# Patient Record
Sex: Male | Born: 1955 | Race: White | Hispanic: No | Marital: Married | Smoking: Former smoker
Health system: Southern US, Community
[De-identification: ages and names within clinical notes are randomized; demographics above are authoritative.]

## PROBLEM LIST (undated history)

## (undated) DIAGNOSIS — E119 Type 2 diabetes mellitus without complications: Secondary | ICD-10-CM

## (undated) DIAGNOSIS — K219 Gastro-esophageal reflux disease without esophagitis: Secondary | ICD-10-CM

## (undated) DIAGNOSIS — E785 Hyperlipidemia, unspecified: Secondary | ICD-10-CM

## (undated) DIAGNOSIS — J449 Chronic obstructive pulmonary disease, unspecified: Secondary | ICD-10-CM

## (undated) HISTORY — DX: Hyperlipidemia, unspecified: E78.5

## (undated) HISTORY — DX: Type 2 diabetes mellitus without complications: E11.9

## (undated) HISTORY — DX: Chronic obstructive pulmonary disease, unspecified: J44.9

## (undated) HISTORY — DX: Gastro-esophageal reflux disease without esophagitis: K21.9

---
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
## 2010-04-06 ENCOUNTER — Ambulatory Visit: Payer: Worker's Compensation | Attending: *Deleted | Admitting: Physical Therapy

## 2010-04-06 DIAGNOSIS — M25559 Pain in unspecified hip: Secondary | ICD-10-CM | POA: Insufficient documentation

## 2010-04-06 DIAGNOSIS — M545 Low back pain, unspecified: Secondary | ICD-10-CM | POA: Insufficient documentation

## 2010-04-06 DIAGNOSIS — IMO0001 Reserved for inherently not codable concepts without codable children: Secondary | ICD-10-CM | POA: Insufficient documentation

## 2010-04-06 DIAGNOSIS — R5381 Other malaise: Secondary | ICD-10-CM | POA: Insufficient documentation

## 2010-04-08 ENCOUNTER — Encounter: Payer: Self-pay | Admitting: Physical Therapy

## 2010-04-08 ENCOUNTER — Ambulatory Visit: Payer: Worker's Compensation | Attending: *Deleted | Admitting: Physical Therapy

## 2010-04-08 ENCOUNTER — Ambulatory Visit: Payer: Self-pay | Admitting: Physical Therapy

## 2010-04-08 DIAGNOSIS — M25559 Pain in unspecified hip: Secondary | ICD-10-CM | POA: Insufficient documentation

## 2010-04-08 DIAGNOSIS — M545 Low back pain, unspecified: Secondary | ICD-10-CM | POA: Insufficient documentation

## 2010-04-08 DIAGNOSIS — R5381 Other malaise: Secondary | ICD-10-CM | POA: Insufficient documentation

## 2010-04-08 DIAGNOSIS — IMO0001 Reserved for inherently not codable concepts without codable children: Secondary | ICD-10-CM | POA: Insufficient documentation

## 2010-04-12 ENCOUNTER — Ambulatory Visit: Payer: Worker's Compensation | Admitting: Physical Therapy

## 2010-04-15 ENCOUNTER — Ambulatory Visit: Payer: Worker's Compensation | Admitting: *Deleted

## 2010-04-20 ENCOUNTER — Ambulatory Visit: Payer: Worker's Compensation | Admitting: Physical Therapy

## 2010-04-22 ENCOUNTER — Ambulatory Visit: Payer: Worker's Compensation | Admitting: *Deleted

## 2010-04-26 ENCOUNTER — Ambulatory Visit: Payer: Worker's Compensation | Admitting: Physical Therapy

## 2010-04-29 ENCOUNTER — Ambulatory Visit: Payer: Worker's Compensation | Admitting: Physical Therapy

## 2010-05-03 ENCOUNTER — Ambulatory Visit: Payer: Worker's Compensation | Admitting: *Deleted

## 2010-05-05 ENCOUNTER — Ambulatory Visit: Payer: Worker's Compensation | Attending: *Deleted | Admitting: *Deleted

## 2010-05-05 DIAGNOSIS — M545 Low back pain, unspecified: Secondary | ICD-10-CM | POA: Insufficient documentation

## 2010-05-05 DIAGNOSIS — M25559 Pain in unspecified hip: Secondary | ICD-10-CM | POA: Insufficient documentation

## 2010-05-05 DIAGNOSIS — IMO0001 Reserved for inherently not codable concepts without codable children: Secondary | ICD-10-CM | POA: Insufficient documentation

## 2010-05-05 DIAGNOSIS — R5381 Other malaise: Secondary | ICD-10-CM | POA: Insufficient documentation

## 2010-05-10 ENCOUNTER — Ambulatory Visit: Payer: Worker's Compensation | Admitting: *Deleted

## 2010-05-12 ENCOUNTER — Ambulatory Visit: Payer: Worker's Compensation | Admitting: *Deleted

## 2010-05-17 ENCOUNTER — Ambulatory Visit: Payer: Worker's Compensation | Admitting: *Deleted

## 2010-05-19 ENCOUNTER — Ambulatory Visit: Payer: Worker's Compensation | Admitting: *Deleted

## 2010-05-24 ENCOUNTER — Ambulatory Visit: Payer: Worker's Compensation | Admitting: *Deleted

## 2010-05-25 ENCOUNTER — Ambulatory Visit: Payer: Worker's Compensation | Admitting: Physical Therapy

## 2010-05-26 ENCOUNTER — Encounter: Payer: Self-pay | Admitting: *Deleted

## 2010-05-31 ENCOUNTER — Ambulatory Visit: Payer: Worker's Compensation | Admitting: Physical Therapy

## 2010-06-02 ENCOUNTER — Ambulatory Visit: Payer: Worker's Compensation | Admitting: Physical Therapy

## 2010-06-07 ENCOUNTER — Ambulatory Visit: Payer: Worker's Compensation | Attending: *Deleted | Admitting: Physical Therapy

## 2010-06-07 DIAGNOSIS — IMO0001 Reserved for inherently not codable concepts without codable children: Secondary | ICD-10-CM | POA: Insufficient documentation

## 2010-06-07 DIAGNOSIS — M545 Low back pain, unspecified: Secondary | ICD-10-CM | POA: Insufficient documentation

## 2010-06-07 DIAGNOSIS — M25559 Pain in unspecified hip: Secondary | ICD-10-CM | POA: Insufficient documentation

## 2010-06-07 DIAGNOSIS — R5381 Other malaise: Secondary | ICD-10-CM | POA: Insufficient documentation

## 2010-06-09 ENCOUNTER — Ambulatory Visit: Payer: Worker's Compensation | Admitting: *Deleted

## 2010-06-14 ENCOUNTER — Ambulatory Visit: Payer: Worker's Compensation | Admitting: *Deleted

## 2010-06-16 ENCOUNTER — Ambulatory Visit: Payer: Worker's Compensation | Admitting: *Deleted

## 2010-06-21 ENCOUNTER — Ambulatory Visit: Payer: Worker's Compensation | Admitting: Physical Therapy

## 2010-06-23 ENCOUNTER — Ambulatory Visit: Payer: Worker's Compensation | Admitting: *Deleted

## 2010-06-28 ENCOUNTER — Ambulatory Visit: Payer: Worker's Compensation | Admitting: Physical Therapy

## 2010-06-30 ENCOUNTER — Ambulatory Visit: Payer: Worker's Compensation | Admitting: *Deleted

## 2010-07-05 ENCOUNTER — Ambulatory Visit: Payer: Worker's Compensation | Attending: *Deleted | Admitting: *Deleted

## 2010-07-05 DIAGNOSIS — M25559 Pain in unspecified hip: Secondary | ICD-10-CM | POA: Insufficient documentation

## 2010-07-05 DIAGNOSIS — R5381 Other malaise: Secondary | ICD-10-CM | POA: Insufficient documentation

## 2010-07-05 DIAGNOSIS — IMO0001 Reserved for inherently not codable concepts without codable children: Secondary | ICD-10-CM | POA: Insufficient documentation

## 2010-07-05 DIAGNOSIS — M545 Low back pain, unspecified: Secondary | ICD-10-CM | POA: Insufficient documentation

## 2010-07-07 ENCOUNTER — Ambulatory Visit: Payer: Worker's Compensation | Admitting: *Deleted

## 2010-07-13 ENCOUNTER — Ambulatory Visit: Payer: Worker's Compensation | Admitting: Physical Therapy

## 2010-07-20 ENCOUNTER — Ambulatory Visit: Payer: Worker's Compensation | Admitting: Physical Therapy

## 2014-03-26 ENCOUNTER — Encounter (INDEPENDENT_AMBULATORY_CARE_PROVIDER_SITE_OTHER): Payer: Self-pay

## 2014-03-26 ENCOUNTER — Ambulatory Visit (INDEPENDENT_AMBULATORY_CARE_PROVIDER_SITE_OTHER): Payer: Managed Care, Other (non HMO) | Admitting: Family

## 2014-03-26 ENCOUNTER — Encounter: Payer: Self-pay | Admitting: Family

## 2014-03-26 VITALS — BP 143/93 | HR 102 | Temp 98.2°F | Ht 66.0 in | Wt 202.0 lb

## 2014-03-26 DIAGNOSIS — L02212 Cutaneous abscess of back [any part, except buttock]: Secondary | ICD-10-CM

## 2014-03-26 MED ORDER — SULFAMETHOXAZOLE-TRIMETHOPRIM 800-160 MG PO TABS: 1.0000 | ORAL_TABLET | Freq: Two times a day (BID) | ORAL | Status: DC

## 2014-03-26 NOTE — Progress Notes (Signed)
   Subjective:    Patient ID: Ryan Mcintyre, male    DOB: [birth year], 59 y.o.   MRN: [id number]  HPI Pt presents to the office for a abscess on right upper shoulder.  PT states he has had it for "months", but it has seemed to be worse. States he can not lay on it. States he put some time of cream on it, and it started "ozzing last night".   Review of Systems  Constitutional: Negative.   HENT: Negative.   Respiratory: Negative.   Cardiovascular: Negative.   Gastrointestinal: Negative.   Endocrine: Negative.   Genitourinary: Negative.   Musculoskeletal: Negative.   Neurological: Negative.   Hematological: Negative.   Psychiatric/Behavioral: Negative.   All other systems reviewed and are negative.      Objective:   Physical Exam  Constitutional: He is oriented to person, place, and time. He appears well-developed and well-nourished. No distress.  Neck: Normal range of motion. Neck supple. No thyromegaly present.  Cardiovascular: Normal rate, regular rhythm, normal heart sounds and intact distal pulses.   No murmur heard. Pulmonary/Chest: Effort normal and breath sounds normal. No respiratory distress. He has no wheezes.  Abdominal: Soft. Bowel sounds are normal. He exhibits no distension. There is no tenderness.  Musculoskeletal: Normal range of motion. He exhibits no edema or tenderness.  Neurological: He is alert and oriented to person, place, and time.  Skin: Skin is warm and dry. Rash noted. Rash is pustular. No erythema.  Psychiatric: He has a normal mood and affect. His behavior is normal. Judgment and thought content normal.  Vitals reviewed.   BP 143/93 mmHg  Pulse 102  Temp(Src) 98.2 F (36.8 C) (Oral)  Ht 5\' 6"  (1.676 m)  Wt 202 lb (91.627 kg)  BMI 32.62 kg/m2  Abscess- I & D with moderate amt of white cottage cheese drainage. Cleaned with peroxide and neosporin applied.       Assessment & Plan:  1. Back abscess -Wound care discussed -Keep clean and dry -RTO  in 2 weeks - sulfamethoxazole-trimethoprim (BACTRIM DS,SEPTRA DS) 800-160 MG per tablet; Take 1 tablet by mouth 2 (two) times daily.  Dispense: 28 tablet; Refill: 0  Jannifer Rodneyhristy Jaelee Laughter, FNP

## 2014-03-26 NOTE — Patient Instructions (Signed)

## 2014-04-15 ENCOUNTER — Ambulatory Visit (INDEPENDENT_AMBULATORY_CARE_PROVIDER_SITE_OTHER): Payer: Managed Care, Other (non HMO) | Admitting: Family

## 2014-04-15 ENCOUNTER — Encounter: Payer: Self-pay | Admitting: Family

## 2014-04-15 VITALS — BP 119/88 | HR 88 | Temp 97.4°F | Ht 66.0 in | Wt 200.4 lb

## 2014-04-15 DIAGNOSIS — L02212 Cutaneous abscess of back [any part, except buttock]: Secondary | ICD-10-CM

## 2014-04-15 MED ORDER — CEPHALEXIN 500 MG PO CAPS: 500.0000 mg | ORAL_CAPSULE | Freq: Three times a day (TID) | ORAL | Status: DC

## 2014-04-15 NOTE — Progress Notes (Signed)
   Subjective:    Patient ID: Ryan Mcintyre, male    DOB: May 28, 1955, 59 y.o.   MRN: [id number]  HPI Pt presents to the office to recheck abscess on back. Pt finished his coarse of bactrium and states it has improved greately. States the pain is gone and it is no longer draining.    Review of Systems  Constitutional: Negative.   HENT: Negative.   Respiratory: Negative.   Cardiovascular: Negative.   Gastrointestinal: Negative.   Endocrine: Negative.   Genitourinary: Negative.   Musculoskeletal: Negative.   Neurological: Negative.   Hematological: Negative.   Psychiatric/Behavioral: Negative.   All other systems reviewed and are negative.      Objective:   Physical Exam  Constitutional: He is oriented to person, place, and time. He appears well-developed and well-nourished. No distress.  Neck: Normal range of motion. Neck supple. No thyromegaly present.  Cardiovascular: Normal rate, regular rhythm, normal heart sounds and intact distal pulses.   No murmur heard. Pulmonary/Chest: Effort normal and breath sounds normal. No respiratory distress. He has no wheezes.  Abdominal: Soft. Bowel sounds are normal. He exhibits no distension. There is no tenderness.  Musculoskeletal: Normal range of motion. He exhibits no edema or tenderness.  Neurological: He is alert and oriented to person, place, and time. He has normal reflexes. No cranial nerve deficit.  Skin: Skin is warm and dry. Rash noted. Rash is pustular (Right shoulder small erythemas, with small amt of yellow pus drainage ). No erythema.  Psychiatric: He has a normal mood and affect. His behavior is normal. Judgment and thought content normal.  Vitals reviewed.    BP 119/88 mmHg  Pulse 88  Temp(Src) 97.4 F (36.3 C) (Oral)  Ht 5\' 6"  (1.676 m)  Wt 200 lb 6.4 oz (90.901 kg)  BMI 32.36 kg/m2      Assessment & Plan:  1. Abscess of back -Discuss wound care -Discuss he needs to come back if it becomes infected again -Keep  clean and dry - cephALEXin (KEFLEX) 500 MG capsule; Take 1 capsule (500 mg total) by mouth 3 (three) times daily.  Dispense: 21 capsule; Refill: 0  Jannifer Rodneyhristy Nelson Julson, FNP

## 2014-04-15 NOTE — Patient Instructions (Signed)

## 2014-09-28 ENCOUNTER — Ambulatory Visit (INDEPENDENT_AMBULATORY_CARE_PROVIDER_SITE_OTHER): Payer: Managed Care, Other (non HMO) | Admitting: Family

## 2014-09-28 ENCOUNTER — Encounter: Payer: Self-pay | Admitting: Family

## 2014-09-28 VITALS — BP 116/78 | HR 92 | Temp 98.1°F | Ht 66.0 in | Wt 184.2 lb

## 2014-09-28 DIAGNOSIS — K219 Gastro-esophageal reflux disease without esophagitis: Secondary | ICD-10-CM | POA: Diagnosis not present

## 2014-09-28 DIAGNOSIS — Z0000 Encounter for general adult medical examination without abnormal findings: Secondary | ICD-10-CM | POA: Diagnosis not present

## 2014-09-28 DIAGNOSIS — Z23 Encounter for immunization: Secondary | ICD-10-CM

## 2014-09-28 DIAGNOSIS — E119 Type 2 diabetes mellitus without complications: Secondary | ICD-10-CM | POA: Diagnosis not present

## 2014-09-28 LAB — POCT GLYCOSYLATED HEMOGLOBIN (HGB A1C)

## 2014-09-28 LAB — GLUCOSE, POCT (MANUAL RESULT ENTRY): POC Glucose: 273 mg/dl — AB (ref 70–99)

## 2014-09-28 LAB — POCT UA - MICROALBUMIN: Microalbumin Ur, POC: NEGATIVE mg/L

## 2014-09-28 NOTE — Addendum Note (Signed)
Addended by: Prescott Gum on: 09/28/2014 01:02 PM   Modules accepted: Orders, SmartSet

## 2014-09-28 NOTE — Patient Instructions (Signed)

## 2014-09-28 NOTE — Progress Notes (Signed)
Subjective:    Patient ID: Ryan Mcintyre, male    DOB: 22-Feb-1956, 59 y.o.   MRN: [id number]  Pt presents to the office today for CPE.  Diabetes He presents for his follow-up diabetic visit. He has type 2 diabetes mellitus. His disease course has been stable. There are no hypoglycemic associated symptoms. Pertinent negatives for diabetes include no blurred vision, no chest pain, no foot paresthesias and no foot ulcerations. Symptoms are stable. There are no diabetic complications. Pertinent negatives for diabetic complications include no CVA, heart disease, nephropathy or peripheral neuropathy. Risk factors for coronary artery disease include diabetes mellitus, obesity, tobacco exposure and sedentary lifestyle. Current diabetic treatment includes oral agent (dual therapy). He is compliant with treatment all of the time. He is following a generally healthy diet. His breakfast blood glucose range is generally 110-130 mg/dl. An ACE inhibitor/angiotensin II receptor blocker is not being taken. Eye exam is current.  Gastrophageal Reflux He reports no chest pain, no coughing, no heartburn or no sore throat. This is a chronic problem. The current episode started more than 1 year ago. The problem occurs occasionally. The problem has been resolved. The symptoms are aggravated by lying down and certain foods. Risk factors include smoking/tobacco exposure. He has tried a PPI for the symptoms. The treatment provided significant relief.      Review of Systems  Constitutional: Negative.   HENT: Negative.  Negative for sore throat.   Eyes: Negative for blurred vision.  Respiratory: Negative.  Negative for cough.   Cardiovascular: Negative.  Negative for chest pain.  Gastrointestinal: Negative.  Negative for heartburn.  Endocrine: Negative.   Genitourinary: Negative.   Musculoskeletal: Negative.   Neurological: Negative.   Hematological: Negative.   Psychiatric/Behavioral: Negative.   All other systems  reviewed and are negative.      Objective:   Physical Exam  Constitutional: He is oriented to person, place, and time. He appears well-developed and well-nourished. No distress.  HENT:  Head: Normocephalic.  Right Ear: External ear normal.  Left Ear: External ear normal.  Nose: Nose normal.  Mouth/Throat: Oropharynx is clear and moist.  Eyes: Pupils are equal, round, and reactive to light. Right eye exhibits no discharge. Left eye exhibits no discharge.  Neck: Normal range of motion. Neck supple. No thyromegaly present.  Cardiovascular: Normal rate, regular rhythm, normal heart sounds and intact distal pulses.   No murmur heard. Pulmonary/Chest: Effort normal and breath sounds normal. No respiratory distress. He has no wheezes.  Abdominal: Soft. Bowel sounds are normal. He exhibits no distension. There is no tenderness.  Musculoskeletal: Normal range of motion. He exhibits no edema or tenderness.  Neurological: He is alert and oriented to person, place, and time. He has normal reflexes. No cranial nerve deficit.  Skin: Skin is warm and dry. No rash noted. No erythema.  Psychiatric: He has a normal mood and affect. His behavior is normal. Judgment and thought content normal.  Vitals reviewed.    BP 116/78 mmHg  Pulse 92  Temp(Src) 98.1 F (36.7 C) (Oral)  Ht $R'5\' 6"'vx$  (1.676 m)  Wt 184 lb 3.2 oz (83.553 kg)  BMI 29.75 kg/m2      Assessment & Plan:  1. Type 2 diabetes mellitus without complication - NLZ76+BHAL - POCT glycosylated hemoglobin (Hb A1C) - POCT UA - Microalbumin  2. Gastroesophageal reflux disease, esophagitis presence not specified - CMP14+EGFR  3. Annual physical exam - CMP14+EGFR - Lipid panel - POCT glycosylated hemoglobin (Hb A1C) - POCT UA -  Microalbumin - Thyroid Panel With TSH - PSA, total and free - Vit D  25 hydroxy (rtn osteoporosis monitoring) - Pneumococcal conjugate vaccine 13-valent IM - Tdap vaccine greater than or equal to 7yo  IM   Continue all meds Labs pending Health Maintenance reviewed Diet and exercise encouraged RTO 6 months  Evelina Dun, FNP

## 2014-09-29 LAB — CMP14+EGFR
A/G RATIO: 1.8 (ref 1.1–2.5)
ALK PHOS: 102 IU/L (ref 39–117)
ALT: 25 IU/L (ref 0–44)
AST: 11 IU/L (ref 0–40)
Albumin: 4.2 g/dL (ref 3.6–4.8)
BUN/Creatinine Ratio: 14 (ref 10–22)
BUN: 14 mg/dL (ref 8–27)
Bilirubin Total: 0.4 mg/dL (ref 0.0–1.2)
CHLORIDE: 98 mmol/L (ref 97–108)
CO2: 24 mmol/L (ref 18–29)
CREATININE: 1.02 mg/dL (ref 0.76–1.27)
Calcium: 9.1 mg/dL (ref 8.6–10.2)
GFR calc Af Amer: 91 mL/min/{1.73_m2} (ref >59)
GFR calc non Af Amer: 79 mL/min/{1.73_m2} (ref >59)
Globulin, Total: 2.3 g/dL (ref 1.5–4.5)
Glucose: 313 mg/dL — ABNORMAL HIGH (ref 65–99)
Potassium: 4.2 mmol/L (ref 3.5–5.2)
Sodium: 140 mmol/L (ref 134–144)
TOTAL PROTEIN: 6.5 g/dL (ref 6.0–8.5)

## 2014-09-29 LAB — LIPID PANEL
Chol/HDL Ratio: 6.3 ratio units — ABNORMAL HIGH (ref 0.0–5.0)
Cholesterol, Total: 184 mg/dL (ref 100–199)
HDL: 29 mg/dL — ABNORMAL LOW (ref >39)
Triglycerides: 416 mg/dL — ABNORMAL HIGH (ref 0–149)

## 2014-09-29 LAB — PSA, TOTAL AND FREE
PROSTATE SPECIFIC AG, SERUM: 1.3 ng/mL (ref 0.0–4.0)
PSA, Free Pct: 22.3 %
PSA, Free: 0.29 ng/mL

## 2014-09-29 LAB — THYROID PANEL WITH TSH
Free Thyroxine Index: 2.1 (ref 1.2–4.9)
T3 Uptake Ratio: 26 % (ref 24–39)
T4, Total: 7.9 ug/dL (ref 4.5–12.0)
TSH: 1.34 u[IU]/mL (ref 0.450–4.500)

## 2014-09-29 LAB — VITAMIN D 25 HYDROXY (VIT D DEFICIENCY, FRACTURES): Vit D, 25-Hydroxy: 30.5 ng/mL (ref 30.0–100.0)

## 2014-09-30 ENCOUNTER — Other Ambulatory Visit: Payer: Self-pay | Admitting: Family

## 2014-09-30 DIAGNOSIS — E785 Hyperlipidemia, unspecified: Secondary | ICD-10-CM | POA: Insufficient documentation

## 2014-09-30 DIAGNOSIS — E1169 Type 2 diabetes mellitus with other specified complication: Secondary | ICD-10-CM | POA: Insufficient documentation

## 2014-09-30 MED ORDER — ROSUVASTATIN CALCIUM 20 MG PO TABS: 20.0000 mg | ORAL_TABLET | Freq: Every day | ORAL | Status: DC

## 2014-09-30 MED ORDER — GLIMEPIRIDE 2 MG PO TABS: 2.0000 mg | ORAL_TABLET | Freq: Every day | ORAL | Status: DC

## 2014-09-30 MED ORDER — METFORMIN HCL 1000 MG PO TABS: ORAL_TABLET | ORAL | Status: DC

## 2014-10-05 ENCOUNTER — Encounter: Payer: Self-pay | Admitting: Pharmacist

## 2014-10-05 ENCOUNTER — Encounter: Payer: Self-pay | Admitting: *Deleted

## 2014-10-05 ENCOUNTER — Ambulatory Visit (INDEPENDENT_AMBULATORY_CARE_PROVIDER_SITE_OTHER): Payer: Managed Care, Other (non HMO) | Admitting: Pharmacist

## 2014-10-05 VITALS — BP 108/78 | HR 88 | Ht 66.0 in | Wt 184.0 lb

## 2014-10-05 DIAGNOSIS — E119 Type 2 diabetes mellitus without complications: Secondary | ICD-10-CM | POA: Diagnosis not present

## 2014-10-05 DIAGNOSIS — E782 Mixed hyperlipidemia: Secondary | ICD-10-CM

## 2014-10-05 DIAGNOSIS — Z720 Tobacco use: Secondary | ICD-10-CM | POA: Diagnosis not present

## 2014-10-05 NOTE — Progress Notes (Signed)
Subjective:    Ryan Mcintyre is a 59 y.o. male who presents for an initial evaluation of Type 2 diabetes mellitus.  Current symptoms/problems include hyperglycemia, nausea, polydipsia, polyuria and weight loss and have been worsening. Symptoms have been present for 4 months. Mother and sister have type 2 DM.  The patient was initially diagnosed with Type 2 diabetes about 1 year ago.  Known diabetic complications: none Cardiovascular risk factors: advanced age (older than 48 for men, 44 for women), diabetes mellitus, dyslipidemia, family history of premature cardiovascular disease and male gender Current diabetic medications include Metformin  qam and  qpm; glimepiride  1 tablet daily..   Eye exam current (within one year): yes Weight trend: decreasing steadily Prior visit with dietician: no - patient was drinking lots of sweet tea and kool-aid sweetened with sugar.  He has never has education about CHO counting diet. Current diet: in general, an "unhealthy" diet Current exercise: none  Current monitoring regimen: home blood tests - 1-2 times daily Home blood sugar records: only started rechecking after 09/30/14 - have been in the 200's since then. Any episodes of hypoglycemia? no  Is He on ACE inhibitor or angiotensin II receptor blocker?  No       The following portions of the patient's history were reviewed and updated as appropriate: allergies, current medications, past family history, past medical history, past social history, past surgical history and problem list.  Objective:    There were no vitals taken for this visit. Lab Review GLUCOSE (mg/dL)  Date Value  86/57/8469 313*   CO2 (mmol/L)  Date Value  09/28/2014 24   BUN (mg/dL)  Date Value  62/95/2841 14   CREATININE, SER (mg/dL)  Date Value  32/44/0102 1.02    A1c = greater then 14% (09/28/2014) Triglycerides = 416 HLD = 29 Assessment:    Diabetes Mellitus type II, under inadequate control.    Hypertriglyceridemia with low HDL - unable to calculate LDL due to elevated Tg.  HTN - BP at goals Tobacco Abuse Plan:    1.  Rx changes: none 2.  Education: Reviewed 'ABCs' of diabetes management (respective goals in parentheses):  A1C (<7), blood pressure (<130/80), and cholesterol (LDL <100). 3.  Compliance at present is estimated to be fair. Efforts to improve compliance (if necessary) will be directed at dietary modifications: discussed CHO counting diet in depth (spent about 30 minutes discussing this).  Patient to continue to not drink sugar sweetented drinks.  Increase non starchy vegetables, limit high fat foods and to limit serving sizes of hihg CHO foods.  Goal is 45 to 55 grams per meal and 15 to 20 grams per snack., increased exercise and regular blood sugar monitoring: 1 to 2  times daily. 4.  Discuss smoking cessation - patient is considering quitting "cold [place]".  Discussed several medication options that can assist with quitting smoking.  He is to consider.  Also gave information about setting quit date and [place] QUIT line.  4. Follow up: 2 months    Henrene Pastor, PharmD, CPP, CDE

## 2014-10-05 NOTE — Patient Instructions (Addendum)
Diabetes and Standards of Medical Care   Diabetes is complicated. You may find that your diabetes team includes a dietitian, nurse, diabetes educator, eye doctor, and more. To help everyone know what is going on and to help you get the care you deserve, the following schedule of care was developed to help keep you on track. Below are the tests, exams, vaccines, medicines, education, and plans you will need.  Blood Glucose Goals Prior to meals = 80 - 130 Within 2 hours of the start of a meal = less than 180  HbA1c test (goal is less than 7.0% - your last value was over 14%) This test shows how well you have controlled your glucose over the past 2 to 3 months. It is used to see if your diabetes management plan needs to be adjusted.   It is performed at least 2 times a year if you are meeting treatment goals.  It is performed 4 times a year if therapy has changed or if you are not meeting treatment goals.  Blood pressure test  This test is performed at every routine medical visit. The goal is less than 140/90 mmHg for most people, but 130/80 mmHg in some cases. Ask your health care provider about your goal.  Dental exam  Follow up with the dentist regularly.  Eye exam  If you are diagnosed with type 1 diabetes as a child, get an exam upon reaching the age of 1 years or older and have had diabetes for 3 to 5 years. Yearly eye exams are recommended after that initial eye exam.  If you are diagnosed with type 1 diabetes as an adult, get an exam within 5 years of diagnosis and then yearly.  If you are diagnosed with type 2 diabetes, get an exam as soon as possible after the diagnosis and then yearly.  Foot care exam  Visual foot exams are performed at every routine medical visit. The exams check for cuts, injuries, or other problems with the feet.  A comprehensive foot exam should be done yearly. This includes visual inspection as well as assessing foot pulses and testing for loss of  sensation.  Check your feet nightly for cuts, injuries, or other problems with your feet. Tell your health care provider if anything is not healing.  Kidney function test (urine microalbumin)  This test is performed once a year.  Type 1 diabetes: The first test is performed 5 years after diagnosis.  Type 2 diabetes: The first test is performed at the time of diagnosis.  A serum creatinine and estimated glomerular filtration rate (eGFR) test is done once a year to assess the level of chronic kidney disease (CKD), if present.  Lipid profile (cholesterol, HDL, LDL, triglycerides)  Performed every 5 years for most people.  The goal for LDL is less than 100 mg/dL. If you are at high risk, the goal is less than 70 mg/dL.  The goal for HDL is 40 mg/dL to 50 mg/dL for men and 50 mg/dL to 60 mg/dL for women. An HDL cholesterol of 60 mg/dL or higher gives some protection against heart disease. (yours was 29 when checked 09-30-2014)  The goal for triglycerides is less than 150 mg/dL. (your were 416 when checked 09-29-2104)  Influenza vaccine, pneumococcal vaccine, and hepatitis B vaccine  The influenza vaccine is recommended yearly.  The pneumococcal vaccine is generally given once in a lifetime. However, there are some instances when another vaccination is recommended. Check with your health care provider.  The hepatitis B vaccine is also recommended for adults with diabetes.  Diabetes self-management education  Education is recommended at diagnosis and ongoing as needed.  Treatment plan  Your treatment plan is reviewed at every medical visit.        Hypoglycemia Hypoglycemia occurs when the glucose in your blood is too low. Glucose is a type of sugar that is your body's main energy source. Hormones, such as insulin and glucagon, control the level of glucose in the blood. Insulin lowers blood glucose and glucagon increases blood glucose. Having too much insulin in your blood  stream, or not eating enough food containing sugar, can result in hypoglycemia. Hypoglycemia can happen to people with or without diabetes. It can develop quickly and can be a medical emergency.  CAUSES   Missing or delaying meals.  Not eating enough carbohydrates at meals.  Taking too much diabetes medicine.  Not timing your oral diabetes medicine or insulin doses with meals, snacks, and exercise.  Nausea and vomiting.  Certain medicines.  Severe illnesses, such as hepatitis, kidney disorders, and certain eating disorders.  Increased activity or exercise without eating something extra or adjusting medicines.  Drinking too much alcohol.  A nerve disorder that affects body functions like your heart rate, blood pressure, and digestion (autonomic neuropathy).  A condition where the stomach muscles do not function properly (gastroparesis). Therefore, medicines and food may not absorb properly.  Rarely, a tumor of the pancreas can produce too much insulin. SYMPTOMS   Hunger.  Sweating (diaphoresis).  Change in body temperature.  Shakiness.  Headache.  Anxiety.  Lightheadedness.  Irritability.  Difficulty concentrating.  Dry mouth.  Tingling or numbness in the hands or feet.  Restless sleep or sleep disturbances.  Altered speech and coordination.  Change in mental status.  Seizures or prolonged convulsions.  Combativeness.  Drowsiness (lethargic).  Weakness.  Increased heart rate or palpitations.  Confusion.  Pale, gray skin color.  Blurred or double vision.  Fainting. DIAGNOSIS  A physical exam and medical history will be performed. Your caregiver may make a diagnosis based on your symptoms. Blood tests and other lab tests may be performed to confirm a diagnosis. Once the diagnosis is made, your caregiver will see if your signs and symptoms go away once your blood glucose is raised.  TREATMENT  Usually, you can easily treat your hypoglycemia when  you notice symptoms.  Check your blood glucose. If it is less than 70 mg/dl, take one of the following:   3-4 glucose tablets.    cup juice.    cup regular soda.   1 cup skim milk.   -1 tube of glucose gel.   5-6 hard candies.   Avoid high-fat drinks or food that may delay a rise in blood glucose levels.  Do not take more than the recommended amount of sugary foods, drinks, gel, or tablets. Doing so will cause your blood glucose to go too high.   Wait 10-15 minutes and recheck your blood glucose. If it is still less than 70 mg/dl or below your target range, repeat treatment.   Eat a snack if it is more than 1 hour until your next meal.  There may be a time when your blood glucose may go so low that you are unable to treat yourself at home when you start to notice symptoms. You may need someone to help you. You may even faint or be unable to swallow. If you cannot treat yourself, someone will need to bring  you to the hospital.  [place]  If you have diabetes, follow your diabetes management plan by:  Taking your medicines as directed.  Following your exercise plan.  Following your meal plan. Do not skip meals. Eat on time.  Testing your blood glucose regularly. Check your blood glucose before and after exercise. If you exercise longer or different than usual, be sure to check blood glucose more frequently.  Wearing your medical alert jewelry that says you have diabetes.  Identify the cause of your hypoglycemia. Then, develop ways to prevent the recurrence of hypoglycemia.  Do not take a hot bath or shower right after an insulin shot.  Always carry treatment with you. Glucose tablets are the easiest to carry.  If you are going to drink alcohol, drink it only with meals.  Tell friends or family members ways to keep you safe during a seizure. This may include removing hard or sharp objects from the area or turning you on your side.  Maintain a  healthy weight. SEEK MEDICAL CARE IF:   You are having problems keeping your blood glucose in your target range.  You are having frequent episodes of hypoglycemia.  You feel you might be having side effects from your medicines.  You are not sure why your blood glucose is dropping so low.  You notice a change in vision or a new problem with your vision. SEEK IMMEDIATE MEDICAL CARE IF:   Confusion develops.  A change in mental status occurs.  The inability to swallow develops.  Fainting occurs. Document Released: 02/20/2005 Document Revised: 02/25/2013 Document Reviewed: 06/19/2011 [facility] Patient Information 2015 [place], [place]. This information is not intended to replace advice given to you by your health care provider. Make sure you discuss any questions you have with your health care provider.

## 2014-12-30 ENCOUNTER — Ambulatory Visit (INDEPENDENT_AMBULATORY_CARE_PROVIDER_SITE_OTHER): Payer: Managed Care, Other (non HMO) | Admitting: Pharmacist

## 2014-12-30 ENCOUNTER — Encounter: Payer: Self-pay | Admitting: Pharmacist

## 2014-12-30 VITALS — BP 120/74 | HR 75 | Ht 66.0 in | Wt 196.0 lb

## 2014-12-30 DIAGNOSIS — Z23 Encounter for immunization: Secondary | ICD-10-CM

## 2014-12-30 DIAGNOSIS — E119 Type 2 diabetes mellitus without complications: Secondary | ICD-10-CM | POA: Diagnosis not present

## 2014-12-30 DIAGNOSIS — E782 Mixed hyperlipidemia: Secondary | ICD-10-CM | POA: Diagnosis not present

## 2014-12-30 DIAGNOSIS — Z720 Tobacco use: Secondary | ICD-10-CM

## 2014-12-30 LAB — POCT GLYCOSYLATED HEMOGLOBIN (HGB A1C): HEMOGLOBIN A1C: 9.5

## 2014-12-30 MED ORDER — ASPIRIN EC 81 MG PO TBEC: 81.0000 mg | DELAYED_RELEASE_TABLET | Freq: Every day | ORAL | Status: DC

## 2014-12-30 MED ORDER — GLIMEPIRIDE 4 MG PO TABS: 4.0000 mg | ORAL_TABLET | Freq: Every day | ORAL | Status: DC

## 2014-12-30 NOTE — Patient Instructions (Signed)
Diabetes and Standards of Medical Care   Diabetes is complicated. You may find that your diabetes team includes a dietitian, nurse, diabetes educator, eye doctor, and more. To help everyone know what is going on and to help you get the care you deserve, the following schedule of care was developed to help keep you on track. Below are the tests, exams, vaccines, medicines, education, and plans you will need.  Blood Glucose Goals Prior to meals = 80 - 130 Within 2 hours of the start of a meal = less than 180  HbA1c test (goal is less than 7.0% - your last value was greater than 14% - today it has decrease to 9.5%) This test shows how well you have controlled your glucose over the past 2 to 3 months. It is used to see if your diabetes management plan needs to be adjusted.   It is performed at least 2 times a year if you are meeting treatment goals.  It is performed 4 times a year if therapy has changed or if you are not meeting treatment goals.  Blood pressure test  This test is performed at every routine medical visit. The goal is less than 140/90 mmHg for most people, but 130/80 mmHg in some cases. Ask your health care provider about your goal.  Dental exam  Follow up with the dentist regularly.  Eye exam  If you are diagnosed with type 1 diabetes as a child, get an exam upon reaching the age of 60 years or older and have had diabetes for 3 to 5 years. Yearly eye exams are recommended after that initial eye exam.  If you are diagnosed with type 1 diabetes as an adult, get an exam within 5 years of diagnosis and then yearly.  If you are diagnosed with type 2 diabetes, get an exam as soon as possible after the diagnosis and then yearly.  Foot care exam  Visual foot exams are performed at every routine medical visit. The exams check for cuts, injuries, or other problems with the feet.  A comprehensive foot exam should be done yearly. This includes visual inspection as well as assessing  foot pulses and testing for loss of sensation.  Check your feet nightly for cuts, injuries, or other problems with your feet. Tell your health care provider if anything is not healing.  Kidney function test (urine microalbumin)  This test is performed once a year.  Type 1 diabetes: The first test is performed 5 years after diagnosis.  Type 2 diabetes: The first test is performed at the time of diagnosis.  A serum creatinine and estimated glomerular filtration rate (eGFR) test is done once a year to assess the level of chronic kidney disease (CKD), if present.  Lipid profile (cholesterol, HDL, LDL, triglycerides)  Performed every 5 years for most people.  The goal for LDL is less than 100 mg/dL. If you are at high risk, the goal is less than 70 mg/dL.  The goal for HDL is 40 mg/dL to 50 mg/dL for men and 50 mg/dL to 60 mg/dL for women. An HDL cholesterol of 60 mg/dL or higher gives some protection against heart disease.  The goal for triglycerides is less than 150 mg/dL.  Influenza vaccine, pneumococcal vaccine, and hepatitis B vaccine  The influenza vaccine is recommended yearly.  The pneumococcal vaccine is generally given once in a lifetime. However, there are some instances when another vaccination is recommended. Check with your health care provider.  The hepatitis B  vaccine is also recommended for adults with diabetes.  Diabetes self-management education  Education is recommended at diagnosis and ongoing as needed.  Treatment plan  Your treatment plan is reviewed at every medical visit.  Document Released: 12/18/2008 Document Revised: 10/23/2012 Document Reviewed: 07/23/2012 [facility] Patient Information 2014 [place].

## 2014-12-30 NOTE — Progress Notes (Signed)
Subjective:    Ryan Mcintyre is a 59 y.o. male who presents for a re-evaluation of Type 2 diabetes mellitus.  I last saw patient 10/05/14  For initial diabetes education.  The patient was initially diagnosed with Type 2 diabetes about 1 year ago. Patient reports improved symptoms - no polyuria or polydipsia in last 2 months.  HBG readings have been usually around 120 to 130.  Marland Kitchen.   Family History - Mother and sister have type 2 DM.  Known diabetic complications: none Cardiovascular risk factors: advanced age (older than 8455 for men, 3165 for women), diabetes mellitus, dyslipidemia, family history of premature cardiovascular disease and male gender Current diabetic medications include Metformin 1500mg  qam and 1000mg  qpm; glimepiride 2mg  1 tablet daily..   Eye exam current (within one year): no - last eye exam was 10/2013 Weight trend: increased since last visit Prior visit with CDE: yes about 3 months ago with me - patient was drinking lots of sweet tea and kool-aid sweetened with sugar.   Current diet: much improved - patient does not drink sugar containg drinks any longer.  He is limiting amounts of high CHO foods and increasing non starchy vegetables Current exercise: none  Current monitoring regimen: home blood tests - 1-2 times daily Home blood sugar records: patient reports fasting BG 120- 130 but he is not checking except in morning Any episodes of hypoglycemia? no  Is He on ACE inhibitor or angiotensin II receptor blocker?  No       The following portions of the patient's history were reviewed and updated as appropriate: allergies, current medications, past family history, past medical history, past social history, past surgical history and problem list.  Objective:    There were no vitals taken for this visit. Lab Review GLUCOSE (mg/dL)  Date Value  16/10/960407/25/2016 313*   CO2 (mmol/L)  Date Value  09/28/2014 24   BUN (mg/dL)  Date Value  54/09/811907/25/2016 14   CREATININE, SER (mg/dL)   Date Value  14/78/295607/25/2016 1.02    A1c = greater then 14% (09/28/2014) Today A1c = 9.5%  POC urine microalbumin was negative 09/28/2014  Lipid Panel rechecked today - pending  Assessment:    Diabetes Mellitus type II, under inadequate control but improved over last 3 months     Hypertriglyceridemia with low HDL HTN - BP at goals Tobacco Abuse - patient is trying hard and has decreased amount he is smoking per day significantly  Plan:    1.  Rx changes: Increase glimepiride to 4mg  1 tablet daily  Add ASA 81mg  1 tablet daily  Continue metformin 1000mg  - take 1500mg  qam and 1000mg  qpm with food 2.  Education: Reviewed 'ABCs' of diabetes management (respective goals in parentheses):  A1C (<7), blood pressure (<130/80), and cholesterol (LDL <100). 3.  Compliance at present is estimated to be fair. Efforts to improve compliance (if necessary) will be directed at dietary modifications:   Reviewed CHO counting diet in depth (spent about 10 minutes discussing this).  Patient to continue to not drink sugar sweetented drinks.  Increase non starchy vegetables, limit high fat foods and to limit serving sizes of hihg CHO foods.    Increased physical activity and   Continue to check BG up to 1 to 2  times daily. Reviewed BG goals both fasting and post prandial 4.  Discuss smoking cessation - Reviewed tips for quitting smoking.  Patient done a great job at reducing the amount her smokes per day.  Specifically address cravings  surrounding after meals and how to avoid.  5.   Influenza vaccine given in office today 6. Follow up: with PCP in 3 months;  Follow up with CDE / clinical pharmacist 6 montsh  Henrene Pastor, PharmD, CPP, CDE

## 2014-12-31 LAB — LIPID PANEL
Chol/HDL Ratio: 5.5 ratio — ABNORMAL HIGH (ref 0.0–5.0)
Cholesterol, Total: 137 mg/dL (ref 100–199)
HDL: 25 mg/dL — ABNORMAL LOW
LDL Calculated: 60 mg/dL (ref 0–99)
Triglycerides: 260 mg/dL — ABNORMAL HIGH (ref 0–149)
VLDL Cholesterol Cal: 52 mg/dL — ABNORMAL HIGH (ref 5–40)

## 2014-12-31 LAB — CMP14+EGFR
ALT: 25 IU/L (ref 0–44)
AST: 18 IU/L (ref 0–40)
Albumin/Globulin Ratio: 1.8 (ref 1.1–2.5)
Albumin: 4.2 g/dL (ref 3.6–4.8)
Alkaline Phosphatase: 83 IU/L (ref 39–117)
BUN/Creatinine Ratio: 13 (ref 10–22)
BUN: 12 mg/dL (ref 8–27)
Bilirubin Total: 0.4 mg/dL (ref 0.0–1.2)
CO2: 27 mmol/L (ref 18–29)
Calcium: 9.6 mg/dL (ref 8.6–10.2)
Chloride: 103 mmol/L (ref 97–106)
Creatinine, Ser: 0.96 mg/dL (ref 0.76–1.27)
GFR calc Af Amer: 98 mL/min/1.73
GFR calc non Af Amer: 85 mL/min/1.73
Globulin, Total: 2.3 g/dL (ref 1.5–4.5)
Glucose: 213 mg/dL — ABNORMAL HIGH (ref 65–99)
Potassium: 4.2 mmol/L (ref 3.5–5.2)
Sodium: 143 mmol/L (ref 136–144)
Total Protein: 6.5 g/dL (ref 6.0–8.5)

## 2014-12-31 LAB — LDL CHOLESTEROL, DIRECT: LDL Direct: 83 mg/dL (ref 0–99)

## 2014-12-31 LAB — MICROALBUMIN / CREATININE URINE RATIO
Creatinine, Urine: 145.6 mg/dL
MICROALB/CREAT RATIO: 2.9 mg/g{creat} (ref 0.0–30.0)
MICROALBUM., U, RANDOM: 4.2 ug/mL

## 2015-03-31 ENCOUNTER — Ambulatory Visit: Payer: Managed Care, Other (non HMO) | Admitting: Family

## 2015-04-01 ENCOUNTER — Ambulatory Visit: Payer: Managed Care, Other (non HMO) | Admitting: Family

## 2015-04-02 ENCOUNTER — Encounter: Payer: Self-pay | Admitting: Family

## 2015-04-05 ENCOUNTER — Ambulatory Visit: Payer: Managed Care, Other (non HMO) | Admitting: Family

## 2015-07-01 ENCOUNTER — Ambulatory Visit: Payer: Self-pay | Admitting: Pharmacist

## 2015-12-07 ENCOUNTER — Other Ambulatory Visit: Payer: Self-pay | Admitting: Family

## 2016-04-11 ENCOUNTER — Ambulatory Visit (INDEPENDENT_AMBULATORY_CARE_PROVIDER_SITE_OTHER): Payer: Medicare Other | Admitting: Family

## 2016-04-11 ENCOUNTER — Encounter: Payer: Self-pay | Admitting: Family

## 2016-04-11 VITALS — BP 138/90 | HR 94 | Temp 97.6°F | Ht 66.0 in | Wt 190.0 lb

## 2016-04-11 DIAGNOSIS — E785 Hyperlipidemia, unspecified: Secondary | ICD-10-CM

## 2016-04-11 DIAGNOSIS — F17200 Nicotine dependence, unspecified, uncomplicated: Secondary | ICD-10-CM

## 2016-04-11 DIAGNOSIS — E119 Type 2 diabetes mellitus without complications: Secondary | ICD-10-CM

## 2016-04-11 DIAGNOSIS — E669 Obesity, unspecified: Secondary | ICD-10-CM | POA: Diagnosis not present

## 2016-04-11 DIAGNOSIS — E663 Overweight: Secondary | ICD-10-CM | POA: Insufficient documentation

## 2016-04-11 DIAGNOSIS — K219 Gastro-esophageal reflux disease without esophagitis: Secondary | ICD-10-CM | POA: Diagnosis not present

## 2016-04-11 DIAGNOSIS — Z1211 Encounter for screening for malignant neoplasm of colon: Secondary | ICD-10-CM | POA: Diagnosis not present

## 2016-04-11 DIAGNOSIS — Z87891 Personal history of nicotine dependence: Secondary | ICD-10-CM | POA: Insufficient documentation

## 2016-04-11 LAB — BAYER DCA HB A1C WAIVED: HB A1C: 10.5 % — AB (ref <7.0)

## 2016-04-11 NOTE — Progress Notes (Signed)
Subjective:    Patient ID: Ryan Mcintyre, male    DOB: 06-Apr-1955, 61 y.o.   MRN: [id number]  Pt presents to the office today for CPE.  Diabetes  He presents for his follow-up diabetic visit. He has type 2 diabetes mellitus. His disease course has been stable. There are no hypoglycemic associated symptoms. Pertinent negatives for diabetes include no blurred vision, no chest pain, no foot paresthesias and no foot ulcerations. Symptoms are stable. There are no diabetic complications. Pertinent negatives for diabetic complications include no CVA, heart disease, nephropathy or peripheral neuropathy. Risk factors for coronary artery disease include diabetes mellitus, obesity, tobacco exposure and sedentary lifestyle. Current diabetic treatment includes oral agent (dual therapy). He is compliant with treatment all of the time. He is following a generally healthy diet. His breakfast blood glucose range is generally >200 mg/dl. An ACE inhibitor/angiotensin II receptor blocker is not being taken. Eye exam is not current.  Gastroesophageal Reflux  He reports no chest pain, no coughing, no heartburn or no sore throat. This is a chronic problem. The current episode started more than 1 year ago. The problem occurs occasionally. The problem has been resolved. The symptoms are aggravated by lying down and certain foods. Risk factors include smoking/tobacco exposure. He has tried a PPI for the symptoms. The treatment provided significant relief.  Hyperlipidemia  This is a chronic problem. The current episode started more than 1 year ago. The problem is uncontrolled. Recent lipid tests were reviewed and are high. Exacerbating diseases include obesity. Pertinent negatives include no chest pain. Current antihyperlipidemic treatment includes statins. The current treatment provides moderate improvement of lipids. Risk factors for coronary artery disease include dyslipidemia, male sex, obesity and a sedentary lifestyle.       Review of Systems  Constitutional: Negative.   HENT: Negative.  Negative for sore throat.   Eyes: Negative for blurred vision.  Respiratory: Negative.  Negative for cough.   Cardiovascular: Negative.  Negative for chest pain.  Gastrointestinal: Negative.  Negative for heartburn.  Endocrine: Negative.   Genitourinary: Negative.   Musculoskeletal: Negative.   Neurological: Negative.   Hematological: Negative.   Psychiatric/Behavioral: Negative.   All other systems reviewed and are negative.      Objective:   Physical Exam  Constitutional: He is oriented to person, place, and time. He appears well-developed and well-nourished. No distress.  HENT:  Head: Normocephalic.  Right Ear: External ear normal.  Left Ear: External ear normal.  Nose: Nose normal.  Mouth/Throat: Oropharynx is clear and moist.  Eyes: Pupils are equal, round, and reactive to light. Right eye exhibits no discharge. Left eye exhibits no discharge.  Neck: Normal range of motion. Neck supple. No thyromegaly present.  Cardiovascular: Normal rate, regular rhythm, normal heart sounds and intact distal pulses.   No murmur heard. Pulmonary/Chest: Effort normal and breath sounds normal. No respiratory distress. He has no wheezes.  Abdominal: Soft. Bowel sounds are normal. He exhibits no distension. There is no tenderness.  Musculoskeletal: Normal range of motion. He exhibits no edema or tenderness.  Neurological: He is alert and oriented to person, place, and time. He has normal reflexes. No cranial nerve deficit.  Skin: Skin is warm and dry. No rash noted. No erythema.  Psychiatric: He has a normal mood and affect. His behavior is normal. Judgment and thought content normal.  Vitals reviewed.    BP 138/90   Pulse 94   Temp 97.6 F (36.4 C) (Oral)   Ht '5\' 6"'$  (  1.676 m)   Wt 190 lb (86.2 kg)   BMI 30.67 kg/m       Assessment & Plan:  1. Gastroesophageal reflux disease, esophagitis presence not  specified - CMP14+EGFR - Ambulatory referral to Gastroenterology  2. Type 2 diabetes mellitus without complication, without long-term current use of insulin (HCC) - CMP14+EGFR - Microalbumin / creatinine urine ratio - Bayer DCA Hb A1c Waived - Ambulatory referral to Ophthalmology  3. Hyperlipidemia, unspecified hyperlipidemia type - CMP14+EGFR - Lipid panel  4. Obesity (BMI 30-39.9) - CMP14+EGFR  5. Colon cancer screening - Ambulatory referral to Gastroenterology  6. Current smoker -Smoking cessatioin   Continue all meds Labs pending Health Maintenance reviewed Diet and exercise encouraged RTO 4 months   Evelina Dun, FNP

## 2016-04-11 NOTE — Patient Instructions (Signed)
Diabetes Mellitus and Food It is important for you to manage your blood sugar (glucose) level. Your blood glucose level can be greatly affected by what you eat. Eating healthier foods in the appropriate amounts throughout the day at about the same time each day will help you control your blood glucose level. It can also help slow or prevent worsening of your diabetes mellitus. Healthy eating may even help you improve the level of your blood pressure and reach or maintain a healthy weight. General recommendations for healthful eating and cooking habits include:  Eating meals and snacks regularly. Avoid going long periods of time without eating to lose weight.  Eating a diet that consists mainly of plant-based foods, such as fruits, vegetables, nuts, legumes, and whole grains.  Using low-heat cooking methods, such as baking, instead of high-heat cooking methods, such as deep frying.  Work with your dietitian to make sure you understand how to use the Nutrition Facts information on food labels. How can food affect me? Carbohydrates Carbohydrates affect your blood glucose level more than any other type of food. Your dietitian will help you determine how many carbohydrates to eat at each meal and teach you how to count carbohydrates. Counting carbohydrates is important to keep your blood glucose at a healthy level, especially if you are using insulin or taking certain medicines for diabetes mellitus. Alcohol Alcohol can cause sudden decreases in blood glucose (hypoglycemia), especially if you use insulin or take certain medicines for diabetes mellitus. Hypoglycemia can be a life-threatening condition. Symptoms of hypoglycemia (sleepiness, dizziness, and disorientation) are similar to symptoms of having too much alcohol. If your health care provider has given you approval to drink alcohol, do so in moderation and use the following guidelines:  Women should not have more than one drink per day, and men  should not have more than two drinks per day. One drink is equal to: ? 12 oz of beer. ? 5 oz of wine. ? 1 oz of hard liquor.  Do not drink on an empty stomach.  Keep yourself hydrated. Have water, diet soda, or unsweetened iced tea.  Regular soda, juice, and other mixers might contain a lot of carbohydrates and should be counted.  What foods are not recommended? As you make food choices, it is important to remember that all foods are not the same. Some foods have fewer nutrients per serving than other foods, even though they might have the same number of calories or carbohydrates. It is difficult to get your body what it needs when you eat foods with fewer nutrients. Examples of foods that you should avoid that are high in calories and carbohydrates but low in nutrients include:  Trans fats (most processed foods list trans fats on the Nutrition Facts label).  Regular soda.  Juice.  Candy.  Sweets, such as cake, pie, doughnuts, and cookies.  Fried foods.  What foods can I eat? Eat nutrient-rich foods, which will nourish your body and keep you healthy. The food you should eat also will depend on several factors, including:  The calories you need.  The medicines you take.  Your weight.  Your blood glucose level.  Your blood pressure level.  Your cholesterol level.  You should eat a variety of foods, including:  Protein. ? Lean cuts of meat. ? Proteins low in saturated fats, such as fish, egg whites, and beans. Avoid processed meats.  Fruits and vegetables. ? Fruits and vegetables that may help control blood glucose levels, such as apples,   mangoes, and yams.  Dairy products. ? Choose fat-free or low-fat dairy products, such as milk, yogurt, and cheese.  Grains, bread, pasta, and rice. ? Choose whole grain products, such as multigrain bread, whole oats, and brown rice. These foods may help control blood pressure.  Fats. ? Foods containing healthful fats, such as  nuts, avocado, olive oil, canola oil, and fish.  Does everyone with diabetes mellitus have the same meal plan? Because every person with diabetes mellitus is different, there is not one meal plan that works for everyone. It is very important that you meet with a dietitian who will help you create a meal plan that is just right for you. This information is not intended to replace advice given to you by your health care provider. Make sure you discuss any questions you have with your health care provider. Document Released: 11/17/2004 Document Revised: 07/29/2015 Document Reviewed: 01/17/2013 Elsevier Interactive Patient Education  2017 [employer]  

## 2016-04-12 LAB — CMP14+EGFR
A/G RATIO: 1.7 (ref 1.2–2.2)
ALT: 16 IU/L (ref 0–44)
AST: 12 IU/L (ref 0–40)
Albumin: 4.4 g/dL (ref 3.6–4.8)
Alkaline Phosphatase: 89 IU/L (ref 39–117)
BILIRUBIN TOTAL: 0.4 mg/dL (ref 0.0–1.2)
BUN/Creatinine Ratio: 16 (ref 10–24)
BUN: 14 mg/dL (ref 8–27)
CALCIUM: 9.4 mg/dL (ref 8.6–10.2)
CHLORIDE: 101 mmol/L (ref 96–106)
CO2: 28 mmol/L (ref 18–29)
Creatinine, Ser: 0.89 mg/dL (ref 0.76–1.27)
GFR calc Af Amer: 106 mL/min/{1.73_m2} (ref >59)
GFR, EST NON AFRICAN AMERICAN: 92 mL/min/{1.73_m2} (ref >59)
GLUCOSE: 134 mg/dL — AB (ref 65–99)
Globulin, Total: 2.6 g/dL (ref 1.5–4.5)
POTASSIUM: 4 mmol/L (ref 3.5–5.2)
Sodium: 143 mmol/L (ref 134–144)
TOTAL PROTEIN: 7 g/dL (ref 6.0–8.5)

## 2016-04-12 LAB — LIPID PANEL
Chol/HDL Ratio: 5.5 ratio units — ABNORMAL HIGH (ref 0.0–5.0)
Cholesterol, Total: 172 mg/dL (ref 100–199)
HDL: 31 mg/dL — ABNORMAL LOW (ref >39)
LDL Calculated: 88 mg/dL (ref 0–99)
TRIGLYCERIDES: 265 mg/dL — AB (ref 0–149)
VLDL CHOLESTEROL CAL: 53 mg/dL — AB (ref 5–40)

## 2016-04-12 LAB — MICROALBUMIN / CREATININE URINE RATIO
Creatinine, Urine: 214.5 mg/dL
Microalb/Creat Ratio: 5.5 mg/g creat (ref 0.0–30.0)
Microalbumin, Urine: 11.9 ug/mL

## 2016-04-13 ENCOUNTER — Other Ambulatory Visit: Payer: Self-pay | Admitting: Family

## 2016-04-13 MED ORDER — METFORMIN HCL 1000 MG PO TABS: 1000.0000 mg | ORAL_TABLET | Freq: Two times a day (BID) | ORAL | 3 refills | Status: DC

## 2016-04-13 MED ORDER — SITAGLIPTIN PHOSPHATE 100 MG PO TABS: 100.0000 mg | ORAL_TABLET | Freq: Every day | ORAL | 1 refills | Status: DC

## 2016-04-25 ENCOUNTER — Ambulatory Visit (INDEPENDENT_AMBULATORY_CARE_PROVIDER_SITE_OTHER): Payer: Medicare Other | Admitting: Pharmacist

## 2016-04-25 ENCOUNTER — Encounter: Payer: Self-pay | Admitting: Pharmacist

## 2016-04-25 VITALS — BP 136/88 | HR 82 | Ht 66.0 in | Wt 189.0 lb

## 2016-04-25 DIAGNOSIS — E1165 Type 2 diabetes mellitus with hyperglycemia: Secondary | ICD-10-CM

## 2016-04-25 DIAGNOSIS — IMO0001 Reserved for inherently not codable concepts without codable children: Secondary | ICD-10-CM

## 2016-04-25 NOTE — Patient Instructions (Signed)
Diabetes and Standards of Medical Care   Diabetes is complicated. You may find that your diabetes team includes a dietitian, nurse, diabetes educator, eye doctor, and more. To help everyone know what is going on and to help you get the care you deserve, the following schedule of care was developed to help keep you on track. Below are the tests, exams, vaccines, medicines, education, and plans you will need.  Blood Glucose Goals Prior to meals = 80 - 130 Within 2 hours of the start of a meal = less than 180  HbA1c test (goal is less than 7.0% - your last value was 10.5%) This test shows how well you have controlled your glucose over the past 2 to 3 months. It is used to see if your diabetes management plan needs to be adjusted.   It is performed at least 2 times a year if you are meeting treatment goals.  It is performed 4 times a year if therapy has changed or if you are not meeting treatment goals.  Blood pressure test  This test is performed at every routine medical visit. The goal is less than 140/90 mmHg for most people, but 130/80 mmHg in some cases. Ask your health care provider about your goal.  Dental exam  Follow up with the dentist regularly.  Eye exam  If you are diagnosed with type 1 diabetes as a child, get an exam upon reaching the age of 10 years or older and have had diabetes for 3 to 5 years. Yearly eye exams are recommended after that initial eye exam.  If you are diagnosed with type 1 diabetes as an adult, get an exam within 5 years of diagnosis and then yearly.  If you are diagnosed with type 2 diabetes, get an exam as soon as possible after the diagnosis and then yearly.  Foot care exam  Visual foot exams are performed at every routine medical visit. The exams check for cuts, injuries, or other problems with the feet.  A comprehensive foot exam should be done yearly. This includes visual inspection as well as assessing foot pulses and testing for loss of  sensation.  Check your feet nightly for cuts, injuries, or other problems with your feet. Tell your health care provider if anything is not healing.  Kidney function test (urine microalbumin)  This test is performed once a year.  Type 1 diabetes: The first test is performed 5 years after diagnosis.  Type 2 diabetes: The first test is performed at the time of diagnosis.  A serum creatinine and estimated glomerular filtration rate (eGFR) test is done once a year to assess the level of chronic kidney disease (CKD), if present.  Lipid profile (cholesterol, HDL, LDL, triglycerides)  Performed every 5 years for most people.  The goal for LDL is less than 100 mg/dL. If you are at high risk, the goal is less than 70 mg/dL.  The goal for HDL is 40 mg/dL to 50 mg/dL for men and 50 mg/dL to 60 mg/dL for women. An HDL cholesterol of 60 mg/dL or higher gives some protection against heart disease.  The goal for triglycerides is less than 150 mg/dL.  Influenza vaccine, pneumococcal vaccine, and hepatitis B vaccine  The influenza vaccine is recommended yearly.  The pneumococcal vaccine is generally given once in a lifetime. However, there are some instances when another vaccination is recommended. Check with your health care provider.  The hepatitis B vaccine is also recommended for adults with diabetes.    Diabetes self-management education  Education is recommended at diagnosis and ongoing as needed.  Treatment plan  Your treatment plan is reviewed at every medical visit.  Document Released: 12/18/2008 Document Revised: 10/23/2012 Document Reviewed: 07/23/2012 [employer] Patient Information 2014 [employer]   

## 2016-04-25 NOTE — Progress Notes (Signed)
Subjective:    GEVORK AYYAD is a 61 y.o. male who presents for evaluation of Type 2 diabetes mellitus.  I last saw patient 12/2014 when A1c had improved form over 14% to 9.5%.  Patient had not see PCP for over a year until 04/11/2016.  At that visit A1c was found to be elevated again at 10.5%.  Patient admists that he was not taking medications as directed and not following CHO counting diet.   The patient was initially diagnosed with Type 2 diabetes about 3 years ago.  Family History - Mother and sister have type 2 DM.  Known diabetic complications: none Cardiovascular risk factors: advanced age (older than 37 for men, 73 for women), diabetes mellitus, dyslipidemia, family history of premature cardiovascular disease and male gender Current diabetic medications include Metformin 1000mg  qam and 1000mg  qpm; glimepiride 4mg  1 tablet qam.  He was prescribed Januvia 100mg  daily by PCP but he has not started because cost was over $600 per month.  I reviewed his formulary and indeed it lists Januvia as tier 3 and patient must pay 100% of cost for tier 3. (he has [employer] PDP plan)   Eye exam current (within one year): no - referral was sent by PCP 04/12/2016.  Letter with date and time mailed to patient but he has not received yet.  Weight trend: increased about 5 to 6 lbs since 2016 Prior visit with CDE: yes about 15 months ago with me    Current diet: not following CHO counting diet.  Drinks several Mt Dews per day (not diet); not limting serving sizes Current exercise: none  Current monitoring regimen: home blood tests - 1-2 times daily Home blood sugar records: patient reports BG reading ranging from 113 to 230  Any episodes of hypoglycemia? no  Is He on ACE inhibitor or angiotensin II receptor blocker?  No       The following portions of the patient's history were reviewed and updated as appropriate: allergies, current medications, past family history, past medical history,  past social history, past surgical history and problem list.  Objective:    BP 136/88   Pulse 82   Ht 5\' 6"  (1.676 m)   Wt 189 lb (85.7 kg)   BMI 30.51 kg/m    Urine albumin was WNL when checked 04/2016  Lab Review Glucose (mg/dL)  Date Value  69/62/9528 134 (H)  12/30/2014 213 (H)  09/28/2014 313 (H)   CO2 (mmol/L)  Date Value  04/11/2016 28  12/30/2014 27  09/28/2014 24   BUN (mg/dL)  Date Value  41/32/4401 14  12/30/2014 12  09/28/2014 14   Creatinine, Ser (mg/dL)  Date Value  02/72/5366 0.89  12/30/2014 0.96  09/28/2014 1.02    A1c = greater then 14% (09/28/2014) A1c = 9.5% (12/2014) A1c = 10.5% (04/11/2016)  Lipid Panel     Component Value Date/Time   CHOL 172 04/11/2016 1452   TRIG 265 (H) 04/11/2016 1452   HDL 31 (L) 04/11/2016 1452   CHOLHDL 5.5 (H) 04/11/2016 1452   LDLCALC 88 04/11/2016 1452   LDLDIRECT 83 12/30/2014 0903     Assessment:    Diabetes Mellitus type II, under inadequate control due to dietary and medication non compliance    Hypertriglyceridemia with low HDL HTN - BP at goals Colon cancer screening - patient states colonoscopy was done in [place] about 1 year ago  Plan:    1.  Rx changes: Restart glimepiride to 4mg  1 tablet daily  and metformin 1000mg  bid  Hold off on Januvia for now due to cost 2.  Education: Reviewed 'ABCs' of diabetes management (respective goals in parentheses):  A1C (<7), blood pressure (<130/80), and cholesterol (LDL <100). 3.  Compliance at present is estimated to be inadequate.   Efforts to improve compliance will be directed at dietary modifications:   Reviewed CHO counting diet in depth (spent about 20 minutes discussing this). Handout for plate method reviewed and given.  Discontinue any sugar containing beverages  Increase non starchy vegetables, limit high fat foods and to limit serving sizes of high CHO foods.    4.  Increased physical activity - patient has plans to buy bicycle for exercise   5.  Reviewed BG goals.  Patient is to check BG 2 times a day.  He is reminded to bring glucometer to every visit.  6.   [facility] 7.  Called Dr Carmela Rimaran's office to get appt info - March 21st at 10:40am - patient aware 6. Follow up: with PCP in 3 months;  Follow up with CDE / clinical pharmacist 1 month.  Henrene Pastorammy Remi Lopata, PharmD, CPP, CDE

## 2016-05-23 ENCOUNTER — Encounter: Payer: Self-pay | Admitting: Pharmacist

## 2016-05-23 ENCOUNTER — Ambulatory Visit (INDEPENDENT_AMBULATORY_CARE_PROVIDER_SITE_OTHER): Payer: Medicare Other | Admitting: Pharmacist

## 2016-05-23 VITALS — BP 136/82 | HR 78 | Ht 66.0 in | Wt 193.0 lb

## 2016-05-23 DIAGNOSIS — Z0000 Encounter for general adult medical examination without abnormal findings: Secondary | ICD-10-CM | POA: Diagnosis not present

## 2016-05-23 DIAGNOSIS — F17200 Nicotine dependence, unspecified, uncomplicated: Secondary | ICD-10-CM

## 2016-05-23 DIAGNOSIS — F17210 Nicotine dependence, cigarettes, uncomplicated: Secondary | ICD-10-CM

## 2016-05-23 DIAGNOSIS — E119 Type 2 diabetes mellitus without complications: Secondary | ICD-10-CM

## 2016-05-23 NOTE — Progress Notes (Signed)
Patient ID: Ryan Mcintyre, male   DOB: 1955/04/29, 61 y.o.   MRN: [id number]     Subjective:   Ryan Mcintyre is a 61 y.o. male who presents for an Initial Medicare Annual Wellness Visit.  Social History: Born/Raised:  [place] in [place].  Moved to [place] about [place] years ago after first wife died Occupational history: [employer] for several years. 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 Marital history: married.  First wife died about 10 years ago. 3 children and 3 grandchildren.  Hobbies - raises chicken Alcohol/Tobacco/Substances: yes - tobacco use; no alcohol  Patient brings in glucometer today - per glucometer 7 day avg = 139 14 day avg = 147 30 day avg = 144   Current Medications (verified) Outpatient Encounter Prescriptions as of 05/23/2016  Medication Sig  . aspirin EC 81 MG tablet Take 1 tablet (81 mg total) by mouth daily.  Marland Kitchen. glimepiride (AMARYL) 4 MG tablet Take 1 tablet (4 mg total) by mouth daily before breakfast.  . metFORMIN (GLUCOPHAGE) 1000 MG tablet Take 1 tablet (1,000 mg total) by mouth 2 (two) times daily with a meal.  . rosuvastatin (CRESTOR) 20 MG tablet Take 1 tablet (20 mg total) by mouth daily. (Patient not taking: Reported on 04/25/2016)  . [DISCONTINUED] RANITIDINE HCL PO Take 1 tablet by mouth daily as needed.    No facility-administered encounter medications on file as of 05/23/2016.     Allergies (verified) Patient has no known allergies.   History: Past Medical History:  Diagnosis Date  . Diabetes mellitus without complication (HCC)   . GERD (gastroesophageal reflux disease)   . Hyperlipidemia    Past Surgical History:  Procedure Laterality Date  . CIRCUMCISION  1974   Family History  Problem Relation Age of Onset  . Hypertension Mother   . Diabetes Mother   . Diabetes Sister   . Early death Sister   . Early death Brother   . Early death Brother    Social History   Occupational History  . Not on file.    Social History Main Topics  . Smoking status: Current Every Day Smoker    Packs/day: 1.00    Years: 50.00    Types: Cigarettes  . Smokeless tobacco: Never Used     Comment: patinet decreasing from 60 to 10 cig per day  . Alcohol use No  . Drug use: No  . Sexual activity: Yes    Do you feel safe at home?  Yes Are there smokers in your home (other than you)? No  Dietary issues and exercise activities discussed: Current Exercise Habits: Home exercise routine, Time (Minutes): 15, Frequency (Times/Week): 6, Weekly Exercise (Minutes/Week): 90, Intensity: Moderate  Exercise started since our last visit 1 month ago.  Current Dietary habits:  Patient has made lots of positive changes since our visit 1 month ago.  No longer drinking regular soda.  Limiting serving sizes of potatoes, corn and peas.      Cardiac Risk Factors include: advanced age (>4555men, 31>65 women);diabetes mellitus;dyslipidemia;male gender;obesity (BMI >30kg/m2);smoking/ tobacco exposure  Objective:    Today's Vitals   05/23/16 1529 05/23/16 1540  BP: (!) 144/84 136/82  Pulse: 78   Weight: 193 lb (87.5 kg)   Height: 5\' 6"  (1.676 m)   PainSc: 2    PainLoc: Knee    Body mass index is 31.15 kg/m.   Activities of Daily Living In your present state of health, do you  have any difficulty performing the following activities: 05/23/2016  Hearing? N  Vision? N  Difficulty concentrating or making decisions? N  Walking or climbing stairs? N  Dressing or bathing? N  Doing errands, shopping? N  Preparing Food and eating ? N  Using the Toilet? N  In the past six months, have you accidently leaked urine? N  Do you have problems with loss of bowel control? N  Managing your Medications? N  Managing your Finances? N  Housekeeping or managing your Housekeeping? N  Some recent data might be hidden     Depression Screen PHQ 2/9 Scores 05/23/2016 04/11/2016 09/28/2014 03/26/2014  PHQ - 2 Score 0 0 0 0     Fall Risk Fall  Risk  05/23/2016 04/11/2016 09/28/2014 03/26/2014  Falls in the past year? No No No No    Cognitive Function: MMSE - Mini Mental State Exam 05/23/2016  Orientation to time 4  Orientation to Place 5  Registration 3  Attention/ Calculation 5  Recall 3  Language- name 2 objects 2  Language- repeat 1  Language- follow 3 step command 3  Language- read & follow direction 1  Write a sentence 1  Copy design 1  Total score 29    Immunizations and Health Maintenance Immunization History  Administered Date(s) Administered  . Influenza,inj,Quad PF,36+ Mos 12/30/2014  . Pneumococcal Conjugate-13 09/28/2014  . Tdap 09/28/2014   There are no preventive care reminders to display for this patient.  Patient Care Team: Junie Spencer, FNP as PCP - General (Nurse Practitioner) Truc Gaylene Brooks, OD as Consulting Physician (Optometry) Mikal Plane, MD as Consulting Physician  Indicate any recent Medical Services you may have received from other than [facility] providers in the past year (date may be approximate).    Assessment:    Annual Wellness Visit    Screening Tests Health Maintenance  Topic Date Due  . INFLUENZA VACCINE  06/03/2016 (Originally 10/05/2015)  . OPHTHALMOLOGY EXAM  10/09/2016 (Originally 10/05/2014)  . HEMOGLOBIN A1C  10/09/2016  . FOOT EXAM  04/11/2017  . URINE MICROALBUMIN  04/11/2017  . PNEUMOCOCCAL POLYSACCHARIDE VACCINE (2) 09/28/2019  . COLONOSCOPY  03/11/2023  . TETANUS/TDAP  09/27/2024  . Hepatitis C Screening  Completed  . HIV Screening  Addressed        Plan:   During the course of the visit Ryan Mcintyre was educated and counseled about the following appropriate screening and preventive services:   Vaccines to include Pneumoccal, Influenza,  Td, Zostavax - declined shingles vaccine and influenza vaccines for this year.  Colorectal cancer screening - UTD  Cardiovascular disease screening - consider EKG at next visit with PCP  Diabetes - improved BG control since  restarting metformin and glimepiride per HBG readings. Follow up with PCP as planned in 2 months.  Glaucoma screening / Diabetic Eye Exam - has appt tomorrow  Nutrition counseling - continue to limit serving sizes of CHO foods.  Also recommended continue to avoid sugar containing beverages.  Prostate cancer screening - Needed with next labs  Smoking cessation counseling - ordered CT of lung for lung cancer screening.  Discussed smoking cessation and pharmacotherapy - patient declined   Advanced Directives - infromation packet reviewed and packet given for patient and his wife  Physical Activity - continue with current physical acitivty - goal is to work up to 150 minutes per week!    Patient Instructions (the written plan) were given to the patient.   Henrene Pastor, PharmD   05/23/2016

## 2016-05-23 NOTE — Patient Instructions (Addendum)
  [name] , Thank you for taking time to come for your Medicare Wellness Visit. I appreciate your ongoing commitment to your health goals. Please review the following plan we discussed and let me know if I can assist you in the future.   These are the goals we discussed:  Sending referral for lung CT - screening for lung cancer.   Continue to walk, bike or do stepper - goal is to exercise about 150 minutes per week.   Continue current medications Continue to decrease number of cigarettes smoking per day.  Call if you want medication to help you quit.    This is a list of the screening recommended for you and due dates:  Health Maintenance  Topic Date Due  . Flu Shot  06/03/2016*  . Eye exam for diabetics  10/09/2016*  . Hemoglobin A1C  10/09/2016  . Complete foot exam   04/11/2017  . Urine Protein Check  04/11/2017  . Pneumococcal vaccine (2) 09/28/2019  . Colon Cancer Screening  03/11/2023  . Tetanus Vaccine  09/27/2024  .  Hepatitis C: One time screening is recommended by Center for Disease Control  (CDC) for  adults born from [birth year] through 1967.   Completed  . HIV Screening  Addressed  *Topic was postponed. The date shown is not the original due date.

## 2016-05-24 DIAGNOSIS — E119 Type 2 diabetes mellitus without complications: Secondary | ICD-10-CM | POA: Diagnosis not present

## 2016-05-24 DIAGNOSIS — Z7984 Long term (current) use of oral hypoglycemic drugs: Secondary | ICD-10-CM | POA: Diagnosis not present

## 2016-05-24 DIAGNOSIS — H2513 Age-related nuclear cataract, bilateral: Secondary | ICD-10-CM | POA: Diagnosis not present

## 2016-05-24 LAB — HM DIABETES EYE EXAM

## 2016-05-30 ENCOUNTER — Other Ambulatory Visit: Payer: Self-pay | Admitting: Family

## 2016-05-30 DIAGNOSIS — Z87891 Personal history of nicotine dependence: Secondary | ICD-10-CM

## 2016-05-31 ENCOUNTER — Ambulatory Visit (HOSPITAL_COMMUNITY)
Admission: RE | Admit: 2016-05-31 | Discharge: 2016-05-31 | Disposition: A | Discharge status: Home / Self Care | Payer: Medicare Other | Source: Ambulatory Visit | Attending: Family | Admitting: Family

## 2016-05-31 DIAGNOSIS — Z87891 Personal history of nicotine dependence: Secondary | ICD-10-CM | POA: Insufficient documentation

## 2016-05-31 DIAGNOSIS — Z122 Encounter for screening for malignant neoplasm of respiratory organs: Secondary | ICD-10-CM | POA: Diagnosis not present

## 2016-05-31 DIAGNOSIS — R918 Other nonspecific abnormal finding of lung field: Secondary | ICD-10-CM | POA: Insufficient documentation

## 2016-05-31 DIAGNOSIS — K760 Fatty (change of) liver, not elsewhere classified: Secondary | ICD-10-CM | POA: Diagnosis not present

## 2016-05-31 DIAGNOSIS — J432 Centrilobular emphysema: Secondary | ICD-10-CM | POA: Diagnosis not present

## 2016-08-10 ENCOUNTER — Ambulatory Visit: Payer: Medicare Other | Admitting: Family

## 2016-08-14 ENCOUNTER — Ambulatory Visit (INDEPENDENT_AMBULATORY_CARE_PROVIDER_SITE_OTHER): Payer: Medicare Other | Admitting: Physician Assistant

## 2016-08-14 ENCOUNTER — Encounter: Payer: Self-pay | Admitting: Physician Assistant

## 2016-08-14 DIAGNOSIS — J399 Disease of upper respiratory tract, unspecified: Secondary | ICD-10-CM

## 2016-08-14 DIAGNOSIS — R509 Fever, unspecified: Secondary | ICD-10-CM | POA: Diagnosis not present

## 2016-08-14 LAB — VERITOR FLU A/B WAIVED
Influenza A: NEGATIVE
Influenza B: NEGATIVE

## 2016-08-14 LAB — CULTURE, GROUP A STREP

## 2016-08-14 LAB — RAPID STREP SCREEN (MED CTR MEBANE ONLY): Strep Gp A Ag, IA W/Reflex: NEGATIVE

## 2016-08-14 MED ORDER — AMOXICILLIN-POT CLAVULANATE 875-125 MG PO TABS: 1.0000 | ORAL_TABLET | Freq: Two times a day (BID) | ORAL | 0 refills | Status: DC

## 2016-08-14 NOTE — Patient Instructions (Signed)
Upper Respiratory Infection, Adult Most upper respiratory infections (URIs) are caused by a virus. A URI affects the nose, throat, and upper air passages. The most common type of URI is often called "the common cold." Follow these instructions at home:  Take medicines only as told by your doctor.  Gargle warm saltwater or take cough drops to comfort your throat as told by your doctor.  Use a warm mist humidifier or inhale steam from a shower to increase air moisture. This may make it easier to breathe.  Drink enough fluid to keep your pee (urine) clear or pale yellow.  Eat soups and other clear broths.  Have a healthy diet.  Rest as needed.  Go back to work when your fever is gone or your doctor says it is okay. ? You may need to stay home longer to avoid giving your URI to others. ? You can also wear a face mask and wash your hands often to prevent spread of the virus.  Use your inhaler more if you have asthma.  Do not use any tobacco products, including cigarettes, chewing tobacco, or electronic cigarettes. If you need help quitting, ask your doctor. Contact a doctor if:  You are getting worse, not better.  Your symptoms are not helped by medicine.  You have chills.  You are getting more short of breath.  You have brown or red mucus.  You have yellow or brown discharge from your nose.  You have pain in your face, especially when you bend forward.  You have a fever.  You have puffy (swollen) neck glands.  You have pain while swallowing.  You have white areas in the back of your throat. Get help right away if:  You have very bad or constant: ? Headache. ? Ear pain. ? Pain in your forehead, behind your eyes, and over your cheekbones (sinus pain). ? Chest pain.  You have long-lasting (chronic) lung disease and any of the following: ? Wheezing. ? Long-lasting cough. ? Coughing up blood. ? A change in your usual mucus.  You have a stiff neck.  You have  changes in your: ? Vision. ? Hearing. ? Thinking. ? Mood. This information is not intended to replace advice given to you by your health care provider. Make sure you discuss any questions you have with your health care provider. Document Released: 08/09/2007 Document Revised: 10/24/2015 Document Reviewed: 05/28/2013 Elsevier Interactive Patient Education  2018 Elsevier Inc.  

## 2016-08-14 NOTE — Progress Notes (Signed)
Subjective:     Patient ID: Ryan Mcintyre, male   DOB: 01-Aug-1955, 61 y.o.   MRN: [id number]  HPI Fever and cough for 5 days Using OTC meds for sx Cough has been non-prod BS have been doing well  Review of Systems  Constitutional: Positive for activity change, appetite change, chills, diaphoresis, fatigue and fever.  HENT: Positive for congestion, postnasal drip and sore throat. Negative for ear discharge, rhinorrhea, sinus pain, sinus pressure and sneezing.   Respiratory: Positive for cough. Negative for chest tightness, shortness of breath and wheezing.   Cardiovascular: Negative.        Objective:   Physical Exam  Constitutional: He appears well-developed and well-nourished.  HENT:  Mouth/Throat: Oropharynx is clear and moist. No oropharyngeal exudate.  Neck: Neck supple.  Cardiovascular: Normal rate, regular rhythm and normal heart sounds.   Pulmonary/Chest: Effort normal and breath sounds normal. He has no wheezes. He exhibits no tenderness.  Lymphadenopathy:    He has no cervical adenopathy.  Nursing note and vitals reviewed. RST and Rapid flu neg     Assessment:     Fever, unspecified fever cause - Plan: Veritor Flu A/B Waived, Rapid strep screen (not at [facility])  Upper respiratory disease      Plan:     Fluids Rest Augmentin bid x 10 days Continue to monitor BS OTC meds for sx F/U prn

## 2016-08-24 ENCOUNTER — Ambulatory Visit (INDEPENDENT_AMBULATORY_CARE_PROVIDER_SITE_OTHER): Payer: Medicare Other | Admitting: Family

## 2016-08-24 ENCOUNTER — Encounter: Payer: Self-pay | Admitting: Family

## 2016-08-24 VITALS — BP 115/77 | HR 86 | Temp 97.6°F | Ht 66.0 in | Wt 183.0 lb

## 2016-08-24 DIAGNOSIS — F17200 Nicotine dependence, unspecified, uncomplicated: Secondary | ICD-10-CM

## 2016-08-24 DIAGNOSIS — K219 Gastro-esophageal reflux disease without esophagitis: Secondary | ICD-10-CM | POA: Diagnosis not present

## 2016-08-24 DIAGNOSIS — E119 Type 2 diabetes mellitus without complications: Secondary | ICD-10-CM

## 2016-08-24 DIAGNOSIS — Z1211 Encounter for screening for malignant neoplasm of colon: Secondary | ICD-10-CM

## 2016-08-24 DIAGNOSIS — E669 Obesity, unspecified: Secondary | ICD-10-CM | POA: Diagnosis not present

## 2016-08-24 DIAGNOSIS — E785 Hyperlipidemia, unspecified: Secondary | ICD-10-CM | POA: Diagnosis not present

## 2016-08-24 LAB — BAYER DCA HB A1C WAIVED: HB A1C: 7 % — AB (ref <7.0)

## 2016-08-24 MED ORDER — GLIMEPIRIDE 4 MG PO TABS: 4.0000 mg | ORAL_TABLET | Freq: Every day | ORAL | 1 refills | Status: DC

## 2016-08-24 MED ORDER — METFORMIN HCL 1000 MG PO TABS: 1000.0000 mg | ORAL_TABLET | Freq: Two times a day (BID) | ORAL | 3 refills | Status: DC

## 2016-08-24 NOTE — Patient Instructions (Signed)
Diabetes Mellitus and Food It is important for you to manage your blood sugar (glucose) level. Your blood glucose level can be greatly affected by what you eat. Eating healthier foods in the appropriate amounts throughout the day at about the same time each day will help you control your blood glucose level. It can also help slow or prevent worsening of your diabetes mellitus. Healthy eating may even help you improve the level of your blood pressure and reach or maintain a healthy weight. General recommendations for healthful eating and cooking habits include:  Eating meals and snacks regularly. Avoid going long periods of time without eating to lose weight.  Eating a diet that consists mainly of plant-based foods, such as fruits, vegetables, nuts, legumes, and whole grains.  Using low-heat cooking methods, such as baking, instead of high-heat cooking methods, such as deep frying.  Work with your dietitian to make sure you understand how to use the Nutrition Facts information on food labels. How can food affect me? Carbohydrates Carbohydrates affect your blood glucose level more than any other type of food. Your dietitian will help you determine how many carbohydrates to eat at each meal and teach you how to count carbohydrates. Counting carbohydrates is important to keep your blood glucose at a healthy level, especially if you are using insulin or taking certain medicines for diabetes mellitus. Alcohol Alcohol can cause sudden decreases in blood glucose (hypoglycemia), especially if you use insulin or take certain medicines for diabetes mellitus. Hypoglycemia can be a life-threatening condition. Symptoms of hypoglycemia (sleepiness, dizziness, and disorientation) are similar to symptoms of having too much alcohol. If your health care provider has given you approval to drink alcohol, do so in moderation and use the following guidelines:  Women should not have more than one drink per day, and men  should not have more than two drinks per day. One drink is equal to: ? 12 oz of beer. ? 5 oz of wine. ? 1 oz of hard liquor.  Do not drink on an empty stomach.  Keep yourself hydrated. Have water, diet soda, or unsweetened iced tea.  Regular soda, juice, and other mixers might contain a lot of carbohydrates and should be counted.  What foods are not recommended? As you make food choices, it is important to remember that all foods are not the same. Some foods have fewer nutrients per serving than other foods, even though they might have the same number of calories or carbohydrates. It is difficult to get your body what it needs when you eat foods with fewer nutrients. Examples of foods that you should avoid that are high in calories and carbohydrates but low in nutrients include:  Trans fats (most processed foods list trans fats on the Nutrition Facts label).  Regular soda.  Juice.  Candy.  Sweets, such as cake, pie, doughnuts, and cookies.  Fried foods.  What foods can I eat? Eat nutrient-rich foods, which will nourish your body and keep you healthy. The food you should eat also will depend on several factors, including:  The calories you need.  The medicines you take.  Your weight.  Your blood glucose level.  Your blood pressure level.  Your cholesterol level.  You should eat a variety of foods, including:  Protein. ? Lean cuts of meat. ? Proteins low in saturated fats, such as fish, egg whites, and beans. Avoid processed meats.  Fruits and vegetables. ? Fruits and vegetables that may help control blood glucose levels, such as apples,   mangoes, and yams.  Dairy products. ? Choose fat-free or low-fat dairy products, such as milk, yogurt, and cheese.  Grains, bread, pasta, and rice. ? Choose whole grain products, such as multigrain bread, whole oats, and brown rice. These foods may help control blood pressure.  Fats. ? Foods containing healthful fats, such as  nuts, avocado, olive oil, canola oil, and fish.  Does everyone with diabetes mellitus have the same meal plan? Because every person with diabetes mellitus is different, there is not one meal plan that works for everyone. It is very important that you meet with a dietitian who will help you create a meal plan that is just right for you. This information is not intended to replace advice given to you by your health care provider. Make sure you discuss any questions you have with your health care provider. Document Released: 11/17/2004 Document Revised: 07/29/2015 Document Reviewed: 01/17/2013 Elsevier Interactive Patient Education  2017 [employer]  

## 2016-08-24 NOTE — Progress Notes (Signed)
   Subjective:    Patient ID: Ryan Mcintyre, male    DOB: [birth year], 61 y.o.   MRN: [id number]  PT presents to the office today for chronic follow up.  Diabetes  He presents for his follow-up diabetic visit. He has type 2 diabetes mellitus. His disease course has been stable. There are no hypoglycemic associated symptoms. Pertinent negatives for diabetes include no blurred vision and no foot paresthesias. Symptoms are stable. Pertinent negatives for diabetic complications include no CVA, heart disease, nephropathy or peripheral neuropathy. His breakfast blood glucose range is generally 110-130 mg/dl. Eye exam is current.  Gastroesophageal Reflux  He reports no belching or no heartburn. This is a chronic problem. The current episode started more than 1 year ago. The problem occurs occasionally. He has tried a PPI for the symptoms. The treatment provided moderate relief.  Hyperlipidemia  This is a chronic problem. The current episode started more than 1 year ago. The problem is controlled. Recent lipid tests were reviewed and are normal. Current antihyperlipidemic treatment includes diet change. The current treatment provides mild improvement of lipids. Risk factors for coronary artery disease include diabetes mellitus, dyslipidemia and male sex.      Review of Systems  Eyes: Negative for blurred vision.  Gastrointestinal: Negative for heartburn.  All other systems reviewed and are negative.      Objective:   Physical Exam  Constitutional: He is oriented to person, place, and time. He appears well-developed and well-nourished. No distress.  HENT:  Head: Normocephalic.  Right Ear: External ear normal.  Left Ear: External ear normal.  Mouth/Throat: Posterior oropharyngeal erythema present.  Eyes: Pupils are equal, round, and reactive to light. Right eye exhibits no discharge. Left eye exhibits no discharge.  Neck: Normal range of motion. Neck supple. No thyromegaly present.    Cardiovascular: Normal rate, regular rhythm, normal heart sounds and intact distal pulses.   No murmur heard. Pulmonary/Chest: Effort normal and breath sounds normal. No respiratory distress. He has no wheezes.  Abdominal: Soft. Bowel sounds are normal. He exhibits no distension. There is no tenderness.  Musculoskeletal: Normal range of motion. He exhibits no edema or tenderness.  Neurological: He is alert and oriented to person, place, and time.  Skin: Skin is warm and dry. No rash noted. No erythema.  Psychiatric: He has a normal mood and affect. His behavior is normal. Judgment and thought content normal.  Vitals reviewed.     BP 115/77   Pulse 86   Temp 97.6 F (36.4 C)   Ht '5\' 6"'$  (1.676 m)   Wt 183 lb (83 kg)   BMI 29.54 kg/m      Assessment & Plan:  1. Type 2 diabetes mellitus without complication, without long-term current use of insulin (HCC) - Bayer DCA Hb A1c Waived - CMP14+EGFR  2. Gastroesophageal reflux disease, esophagitis presence not specified - CMP14+EGFR  3. Obesity (BMI 30-39.9 - CMP14+EGFR  4. Hyperlipidemia, unspecified hyperlipidemia type - CMP14+EGFR - Lipid panel  5. Current smoker Smoking cessation discussed  - CMP14+EGFR  6. Colon cancer screening - CMP14+EGFR - Fecal occult blood, imunochemical; Future   Continue all meds Labs pending Health Maintenance reviewed Diet and exercise encouraged RTO 4 months   Evelina Dun, FNP

## 2016-08-25 ENCOUNTER — Other Ambulatory Visit: Payer: Self-pay | Admitting: Family

## 2016-08-25 ENCOUNTER — Other Ambulatory Visit: Payer: Medicare Other

## 2016-08-25 DIAGNOSIS — Z1211 Encounter for screening for malignant neoplasm of colon: Secondary | ICD-10-CM

## 2016-08-25 LAB — LIPID PANEL
CHOLESTEROL TOTAL: 141 mg/dL (ref 100–199)
Chol/HDL Ratio: 6.4 ratio — ABNORMAL HIGH (ref 0.0–5.0)
HDL: 22 mg/dL — ABNORMAL LOW (ref >39)
TRIGLYCERIDES: 619 mg/dL — AB (ref 0–149)

## 2016-08-25 LAB — CMP14+EGFR
ALBUMIN: 3.5 g/dL — AB (ref 3.6–4.8)
ALK PHOS: 75 IU/L (ref 39–117)
ALT: 14 IU/L (ref 0–44)
AST: 14 IU/L (ref 0–40)
Albumin/Globulin Ratio: 1 — ABNORMAL LOW (ref 1.2–2.2)
BILIRUBIN TOTAL: 0.2 mg/dL (ref 0.0–1.2)
BUN / CREAT RATIO: 15 (ref 10–24)
BUN: 14 mg/dL (ref 8–27)
CHLORIDE: 100 mmol/L (ref 96–106)
CO2: 28 mmol/L (ref 20–29)
Calcium: 9.2 mg/dL (ref 8.6–10.2)
Creatinine, Ser: 0.95 mg/dL (ref 0.76–1.27)
GFR calc Af Amer: 98 mL/min/{1.73_m2} (ref >59)
GFR calc non Af Amer: 85 mL/min/{1.73_m2} (ref >59)
GLOBULIN, TOTAL: 3.5 g/dL (ref 1.5–4.5)
Glucose: 95 mg/dL (ref 65–99)
POTASSIUM: 5.2 mmol/L (ref 3.5–5.2)
SODIUM: 143 mmol/L (ref 134–144)
Total Protein: 7 g/dL (ref 6.0–8.5)

## 2016-08-25 MED ORDER — GLUCOSE BLOOD VI STRP
ORAL_STRIP | 12 refills | Status: DC
Start: 2016-08-25 — End: 2016-08-28

## 2016-08-25 MED ORDER — FENOFIBRATE 145 MG PO TABS: 145.0000 mg | ORAL_TABLET | Freq: Every day | ORAL | 1 refills | Status: DC

## 2016-08-25 NOTE — Addendum Note (Signed)
Addended by: Angela AdamOSTOSKY, JESSICA C on: 08/25/2016 09:12 AM   Modules accepted: Orders

## 2016-08-28 ENCOUNTER — Other Ambulatory Visit: Payer: Self-pay

## 2016-08-28 LAB — FECAL OCCULT BLOOD, IMMUNOCHEMICAL: FECAL OCCULT BLD: NEGATIVE

## 2016-08-28 MED ORDER — GLUCOSE BLOOD VI STRP: ORAL_STRIP | 12 refills | Status: DC

## 2016-08-30 ENCOUNTER — Other Ambulatory Visit: Payer: Self-pay | Admitting: Family

## 2016-08-30 MED ORDER — GLUCOSE BLOOD VI STRP: ORAL_STRIP | 12 refills | Status: AC

## 2016-12-22 DIAGNOSIS — Z23 Encounter for immunization: Secondary | ICD-10-CM | POA: Diagnosis not present

## 2017-01-23 ENCOUNTER — Encounter: Payer: Self-pay | Admitting: Family

## 2017-01-23 ENCOUNTER — Ambulatory Visit (INDEPENDENT_AMBULATORY_CARE_PROVIDER_SITE_OTHER): Payer: Medicare Other | Admitting: Family

## 2017-01-23 VITALS — BP 134/87 | HR 94 | Temp 97.9°F | Ht 66.0 in | Wt 186.6 lb

## 2017-01-23 DIAGNOSIS — H109 Unspecified conjunctivitis: Secondary | ICD-10-CM

## 2017-01-23 MED ORDER — POLYMYXIN B-TRIMETHOPRIM 10000-0.1 UNIT/ML-% OP SOLN: 1.0000 [drp] | OPHTHALMIC | 0 refills | Status: DC

## 2017-01-23 NOTE — Progress Notes (Signed)
   Subjective:    Patient ID: Ryan Mcintyre, male    DOB: 18-Feb-1956, 61 y.o.   MRN: [id number]  Eye Pain   The right eye is affected. This is a new problem. The current episode started in the past 7 days. The problem occurs constantly. The problem has been gradually worsening. There was no injury mechanism. The pain is at a severity of 1/10. The pain is mild. Associated symptoms include eye redness and a foreign body sensation. Pertinent negatives include no blurred vision, eye discharge, double vision, fever, itching, nausea or vomiting. He has tried water for the symptoms. The treatment provided mild relief.      Review of Systems  Constitutional: Negative for fever.  Eyes: Positive for pain and redness. Negative for blurred vision, double vision, discharge and itching.  Gastrointestinal: Negative for nausea and vomiting.  All other systems reviewed and are negative.      Objective:   Physical Exam  Constitutional: He is oriented to person, place, and time. He appears well-developed and well-nourished. No distress.  HENT:  Head: Normocephalic.  Right Ear: External ear normal.  Left Ear: External ear normal.  Mouth/Throat: Oropharynx is clear and moist.  Eyes: Pupils are equal, round, and reactive to light. Right eye exhibits discharge (right lid mild swelling and erythemas) and exudate. Left eye exhibits no discharge.  Neck: Normal range of motion. Neck supple. No thyromegaly present.  Cardiovascular: Normal rate, regular rhythm, normal heart sounds and intact distal pulses.  No murmur heard. Pulmonary/Chest: Effort normal and breath sounds normal. No respiratory distress. He has no wheezes.  Abdominal: Soft. Bowel sounds are normal. He exhibits no distension. There is no tenderness.  Musculoskeletal: Normal range of motion. He exhibits no edema or tenderness.  Neurological: He is alert and oriented to person, place, and time. He has normal reflexes. No cranial nerve deficit.    Skin: Skin is warm and dry. No rash noted. No erythema.  Psychiatric: He has a normal mood and affect. His behavior is normal. Judgment and thought content normal.  Vitals reviewed.     BP 134/87   Pulse 94   Temp 97.9 F (36.6 C) (Oral)   Ht 5\' 6"  (1.676 m)   Wt 186 lb 9.6 oz (84.6 kg)   BMI 30.12 kg/m      Assessment & Plan:  1. Bacterial conjunctivitis of right eye Do not rub eyes  Cool compresses  Good hand hygiene discussed RTO prn  - trimethoprim-polymyxin b (POLYTRIM) ophthalmic solution; Place 1 drop into the right eye every 4 (four) hours.  Dispense: 10 mL; Refill: 0    Jannifer Rodneyhristy Doniesha Landau, FNP

## 2017-01-23 NOTE — Patient Instructions (Signed)

## 2017-02-13 ENCOUNTER — Ambulatory Visit: Payer: Medicare Other | Admitting: Family

## 2017-02-21 ENCOUNTER — Ambulatory Visit (INDEPENDENT_AMBULATORY_CARE_PROVIDER_SITE_OTHER): Payer: Medicare Other | Admitting: Family

## 2017-02-21 ENCOUNTER — Encounter: Payer: Self-pay | Admitting: Family

## 2017-02-21 VITALS — BP 120/73 | HR 89 | Temp 97.7°F | Ht 66.0 in | Wt 193.0 lb

## 2017-02-21 DIAGNOSIS — J449 Chronic obstructive pulmonary disease, unspecified: Secondary | ICD-10-CM

## 2017-02-21 DIAGNOSIS — J410 Simple chronic bronchitis: Secondary | ICD-10-CM | POA: Diagnosis not present

## 2017-02-21 DIAGNOSIS — E785 Hyperlipidemia, unspecified: Secondary | ICD-10-CM

## 2017-02-21 DIAGNOSIS — Z1159 Encounter for screening for other viral diseases: Secondary | ICD-10-CM | POA: Diagnosis not present

## 2017-02-21 DIAGNOSIS — K760 Fatty (change of) liver, not elsewhere classified: Secondary | ICD-10-CM | POA: Diagnosis not present

## 2017-02-21 DIAGNOSIS — E119 Type 2 diabetes mellitus without complications: Secondary | ICD-10-CM | POA: Diagnosis not present

## 2017-02-21 DIAGNOSIS — E1169 Type 2 diabetes mellitus with other specified complication: Secondary | ICD-10-CM | POA: Diagnosis not present

## 2017-02-21 DIAGNOSIS — E669 Obesity, unspecified: Secondary | ICD-10-CM | POA: Diagnosis not present

## 2017-02-21 DIAGNOSIS — F17200 Nicotine dependence, unspecified, uncomplicated: Secondary | ICD-10-CM

## 2017-02-21 DIAGNOSIS — K219 Gastro-esophageal reflux disease without esophagitis: Secondary | ICD-10-CM | POA: Diagnosis not present

## 2017-02-21 DIAGNOSIS — R0989 Other specified symptoms and signs involving the circulatory and respiratory systems: Secondary | ICD-10-CM

## 2017-02-21 HISTORY — DX: Chronic obstructive pulmonary disease, unspecified: J44.9

## 2017-02-21 LAB — BAYER DCA HB A1C WAIVED: HB A1C: 12.2 % — AB (ref <7.0)

## 2017-02-21 MED ORDER — UMECLIDINIUM BROMIDE 62.5 MCG/INH IN AEPB: 1.0000 | INHALATION_SPRAY | Freq: Every day | RESPIRATORY_TRACT | 3 refills | Status: DC

## 2017-02-21 NOTE — Progress Notes (Signed)
Subjective:    Patient ID: Ryan Mcintyre, male    DOB: 07/27/1955, 61 y.o.   MRN: [id number]  PT presents to the office today for chronic follow up. PT states he getting "choked" over the last several months, but is becoming worse.  Diabetes  He presents for his follow-up diabetic visit. He has type 2 diabetes mellitus. His disease course has been stable. There are no hypoglycemic associated symptoms. Pertinent negatives for diabetes include no blurred vision, no chest pain and no visual change. There are no hypoglycemic complications. Symptoms are stable. Pertinent negatives for diabetic complications include no CVA, heart disease, nephropathy or peripheral neuropathy. Risk factors for coronary artery disease include diabetes mellitus, dyslipidemia, male sex, hypertension and sedentary lifestyle. His breakfast blood glucose range is generally 110-130 mg/dl. Eye exam is current.  Hyperlipidemia  This is a chronic problem. The current episode started more than 1 year ago. The problem is uncontrolled. Recent lipid tests were reviewed and are high. Pertinent negatives include no chest pain. Current antihyperlipidemic treatment includes statins. The current treatment provides moderate improvement of lipids. Risk factors for coronary artery disease include diabetes mellitus, dyslipidemia, male sex and hypertension.  Gastroesophageal Reflux  He complains of choking and dysphagia. He reports no chest pain, no coughing or no heartburn. This is a chronic problem. The current episode started more than 1 year ago. The problem occurs occasionally. The problem has been resolved. The symptoms are aggravated by medications and smoking. He has tried a PPI for the symptoms. The treatment provided moderate relief.  COPD Pt states he is cutting back to only 3 cigarettes a day and is trying to quit!! Keep up the great work!    Review of Systems  Eyes: Negative for blurred vision.  Respiratory: Positive for choking.  Negative for cough.   Cardiovascular: Negative for chest pain.  Gastrointestinal: Positive for dysphagia. Negative for heartburn.  All other systems reviewed and are negative.      Objective:   Physical Exam  Constitutional: He is oriented to person, place, and time. He appears well-developed and well-nourished. No distress.  HENT:  Head: Normocephalic.  Right Ear: External ear normal.  Left Ear: External ear normal.  Nose: Nose normal.  Mouth/Throat: Oropharynx is clear and moist.  Eyes: Pupils are equal, round, and reactive to light. Right eye exhibits no discharge. Left eye exhibits no discharge.  Neck: Normal range of motion. Neck supple. No thyromegaly present.  Cardiovascular: Normal rate, regular rhythm, normal heart sounds and intact distal pulses.  No murmur heard. Pulmonary/Chest: Effort normal. No respiratory distress. He has decreased breath sounds. He has no wheezes.  Abdominal: Soft. Bowel sounds are normal. He exhibits no distension. There is no tenderness.  Musculoskeletal: Normal range of motion. He exhibits no edema or tenderness.  Neurological: He is alert and oriented to person, place, and time.  Skin: Skin is warm and dry. No rash noted. No erythema.  Psychiatric: He has a normal mood and affect. His behavior is normal. Judgment and thought content normal.  Vitals reviewed.   BP 120/73   Pulse 89   Temp 97.7 F (36.5 C) (Oral)   Ht '5\' 6"'$  (1.676 m)   Wt 193 lb (87.5 kg)   BMI 31.15 kg/m      Assessment & Plan:  1. Type 2 diabetes mellitus without complication, without long-term current use of insulin (HCC) - CMP14+EGFR - Bayer DCA Hb A1c Waived  2. Hyperlipidemia associated with type 2 diabetes mellitus (  [place]) - CMP14+EGFR - Lipid panel  3. Current smoker - CMP14+EGFR - Ambulatory referral to Gastroenterology  4. Obesity (BMI 30-39.9) - CMP14+EGFR  5. Gastroesophageal reflux disease, esophagitis presence not specified - CMP14+EGFR -  Ambulatory referral to Gastroenterology  6. Need for hepatitis C screening test - CMP14+EGFR - Hepatitis C antibody  7. Choking episode Will do referral to GI for EGD - CMP14+EGFR - Ambulatory referral to Gastroenterology  8. Hepatic steatosis  9. Simple chronic bronchitis ([place]) Started on Incruse today Smoking cessation discussed - umeclidinium bromide (INCRUSE ELLIPTA) 62.5 MCG/INH AEPB; Inhale 1 puff into the lungs daily.  Dispense: 1 each; Refill: 3   Continue all meds Labs pending Health Maintenance reviewed Diet and exercise encouraged RTO 4 months   Evelina Dun, FNP

## 2017-02-21 NOTE — Patient Instructions (Signed)
Food Choices for Gastroesophageal Reflux Disease, Adult When you have gastroesophageal reflux disease (GERD), the foods you eat and your eating habits are very important. Choosing the right foods can help ease your discomfort. What guidelines do I need to follow?  Choose fruits, vegetables, whole grains, and low-fat dairy products.  Choose low-fat meat, fish, and poultry.  Limit fats such as oils, salad dressings, butter, nuts, and avocado.  Keep a food diary. This helps you identify foods that cause symptoms.  Avoid foods that cause symptoms. These may be different for everyone.  Eat small meals often instead of 3 large meals a day.  Eat your meals slowly, in a place where you are relaxed.  Limit fried foods.  Cook foods using methods other than frying.  Avoid drinking alcohol.  Avoid drinking large amounts of liquids with your meals.  Avoid bending over or lying down until 2-3 hours after eating. What foods are not recommended? These are some foods and drinks that may make your symptoms worse: Vegetables  Tomatoes. Tomato juice. Tomato and spaghetti sauce. Chili peppers. Onion and garlic. Horseradish. Fruits  Oranges, grapefruit, and lemon (fruit and juice). Meats  High-fat meats, fish, and poultry. This includes hot dogs, ribs, ham, sausage, salami, and bacon. Dairy  Whole milk and chocolate milk. Sour cream. Cream. Butter. Ice cream. Cream cheese. Drinks  Coffee and tea. Bubbly (carbonated) drinks or energy drinks. Condiments  Hot sauce. Barbecue sauce. Sweets/Desserts  Chocolate and cocoa. Donuts. Peppermint and spearmint. Fats and Oils  High-fat foods. This includes French fries and potato chips. Other  Vinegar. Strong spices. This includes black pepper, white pepper, red pepper, cayenne, curry powder, cloves, ginger, and chili powder. The items listed above may not be a complete list of foods and drinks to avoid. Contact your dietitian for more information.    This information is not intended to replace advice given to you by your health care provider. Make sure you discuss any questions you have with your health care provider. Document Released: 08/22/2011 Document Revised: 07/29/2015 Document Reviewed: 12/25/2012 Elsevier Interactive Patient Education  2017 Elsevier Inc.  

## 2017-02-22 LAB — CMP14+EGFR
A/G RATIO: 1.8 (ref 1.2–2.2)
ALBUMIN: 4.3 g/dL (ref 3.6–4.8)
ALK PHOS: 76 IU/L (ref 39–117)
ALT: 26 IU/L (ref 0–44)
AST: 19 IU/L (ref 0–40)
BILIRUBIN TOTAL: 0.3 mg/dL (ref 0.0–1.2)
BUN / CREAT RATIO: 16 (ref 10–24)
BUN: 16 mg/dL (ref 8–27)
CHLORIDE: 102 mmol/L (ref 96–106)
CO2: 26 mmol/L (ref 20–29)
Calcium: 9.7 mg/dL (ref 8.6–10.2)
Creatinine, Ser: 0.99 mg/dL (ref 0.76–1.27)
GFR calc non Af Amer: 81 mL/min/{1.73_m2} (ref >59)
GFR, EST AFRICAN AMERICAN: 93 mL/min/{1.73_m2} (ref >59)
GLOBULIN, TOTAL: 2.4 g/dL (ref 1.5–4.5)
GLUCOSE: 284 mg/dL — AB (ref 65–99)
POTASSIUM: 5.5 mmol/L — AB (ref 3.5–5.2)
SODIUM: 142 mmol/L (ref 134–144)
Total Protein: 6.7 g/dL (ref 6.0–8.5)

## 2017-02-22 LAB — LIPID PANEL
CHOLESTEROL TOTAL: 165 mg/dL (ref 100–199)
Chol/HDL Ratio: 5.5 ratio — ABNORMAL HIGH (ref 0.0–5.0)
HDL: 30 mg/dL — ABNORMAL LOW (ref >39)
LDL Calculated: 65 mg/dL (ref 0–99)
Triglycerides: 348 mg/dL — ABNORMAL HIGH (ref 0–149)
VLDL Cholesterol Cal: 70 mg/dL — ABNORMAL HIGH (ref 5–40)

## 2017-02-22 LAB — HEPATITIS C ANTIBODY: HEP C VIRUS AB: 0.1 {s_co_ratio} (ref 0.0–0.9)

## 2017-02-23 ENCOUNTER — Other Ambulatory Visit: Payer: Self-pay | Admitting: Family

## 2017-02-23 MED ORDER — SEMAGLUTIDE(0.25 OR 0.5MG/DOS) 2 MG/1.5ML ~~LOC~~ SOPN: PEN_INJECTOR | SUBCUTANEOUS | 0 refills | Status: AC

## 2017-02-23 MED ORDER — ATORVASTATIN CALCIUM 20 MG PO TABS: 20.0000 mg | ORAL_TABLET | Freq: Every day | ORAL | 11 refills | Status: DC

## 2017-05-23 ENCOUNTER — Encounter: Payer: Self-pay | Admitting: *Deleted

## 2017-05-23 ENCOUNTER — Ambulatory Visit (INDEPENDENT_AMBULATORY_CARE_PROVIDER_SITE_OTHER): Payer: BLUE CROSS/BLUE SHIELD | Admitting: *Deleted

## 2017-05-23 VITALS — BP 138/84 | HR 87 | Ht 66.0 in | Wt 196.0 lb

## 2017-05-23 DIAGNOSIS — Z0000 Encounter for general adult medical examination without abnormal findings: Secondary | ICD-10-CM

## 2017-05-23 DIAGNOSIS — E119 Type 2 diabetes mellitus without complications: Secondary | ICD-10-CM | POA: Diagnosis not present

## 2017-05-23 NOTE — Progress Notes (Signed)
Subjective:   Ryan Mcintyre is a 62 y.o. male who presents for an Initial Medicare Annual Wellness Visit. Ryan Mcintyre is married and three adult children and three grandchildren. He has one step child and one step grandchild. He lives at home with his wife and does not work due to disability from an on the job injury. Last lab results state that he was prescribed Ozympic. He says that he never picked this up and chart review shows that it was removed from his medication list. He reports taking metformin BID and amaryl with breakfast. He is not taking lipitor or tricor and didn't realize that refills had been sent in on lipitor after review of labs in 02/2017. Chart review shows that he should have ran out of amaryl in December but he reports having it and taking it daily. He was not able to afford the Incruse Ellipta inhaler and hasn't felt that he needed it.   Review of Systems  Health is about the same as last year.   Cardiac Risk Factors include: advanced age (>66men, >69 women);sedentary lifestyle;male gender;obesity (BMI >30kg/m2);diabetes mellitus;dyslipidemia;hypertension  Other systems negative   Objective:    Today's Vitals   05/23/17 1313  BP: 138/84  Pulse: 87  Weight: 196 lb (88.9 kg)  Height: 5\' 6"  (1.676 m)   Body mass index is 31.64 kg/m.  Advanced Directives 05/23/2017 05/23/2016  Does Patient Have a Medical Advance Directive? No No  Would patient like information on creating a medical advance directive? No - Patient declined Yes (MAU/Ambulatory/Procedural Areas - Information given)    Current Medications (verified) Outpatient Encounter Medications as of 05/23/2017  Medication Sig  . glimepiride (AMARYL) 4 MG tablet Take 1 tablet (4 mg total) by mouth daily before breakfast.  . glucose blood test strip Test daily.  . metFORMIN (GLUCOPHAGE) 1000 MG tablet Take 1 tablet (1,000 mg total) by mouth 2 (two) times daily with a meal.  . OMEPRAZOLE PO Take by mouth.  Marland Kitchen  aspirin EC 81 MG tablet Take 1 tablet (81 mg total) by mouth daily.  Marland Kitchen atorvastatin (LIPITOR) 20 MG tablet Take 1 tablet (20 mg total) by mouth daily. (Patient not taking: Reported on 05/23/2017)  . fenofibrate (TRICOR) 145 MG tablet Take 1 tablet (145 mg total) by mouth daily. (Patient not taking: Reported on 05/23/2017)  . umeclidinium bromide (INCRUSE ELLIPTA) 62.5 MCG/INH AEPB Inhale 1 puff into the lungs daily. (Patient not taking: Reported on 05/23/2017)   No facility-administered encounter medications on file as of 05/23/2017.     Allergies (verified) Patient has no known allergies.   History: Past Medical History:  Diagnosis Date  . COPD (chronic obstructive pulmonary disease) (HCC) 02/21/2017  . Diabetes mellitus without complication (HCC)   . GERD (gastroesophageal reflux disease)   . Hyperlipidemia    Past Surgical History:  Procedure Laterality Date  . CIRCUMCISION  1974   Family History  Problem Relation Age of Onset  . Hypertension Mother   . Diabetes Mother   . Diabetes Sister   . Early death Sister   . Early death Brother   . Early death Brother    Social History   Socioeconomic History  . Marital status: Married    Spouse name: None  . Number of children: 3  . Years of education: 60  . Highest education level: 12th grade  Social Needs  . Financial resource strain: Not hard at all  . Food insecurity - worry: Never true  .  Food insecurity - inability: Never true  . Transportation needs - medical: No  . Transportation needs - non-medical: No  Occupational History  . Occupation: Designer, fashion/clothingtextiles    Comment: Disabled  Tobacco Use  . Smoking status: Current Every Day Smoker    Packs/day: 0.25    Years: 50.00    Pack years: 12.50    Types: Cigarettes  . Smokeless tobacco: Never Used  . Tobacco comment: about 3 to 4 cigarettes a day  Substance and Sexual Activity  . Alcohol use: No    Alcohol/week: 0.0 oz  . Drug use: No  . Sexual activity: Yes  Other Topics  Concern  . None  Social History Narrative  . None   Tobacco Counseling Ready to quit: Not Answered Counseling given: Not Answered Comment: about 3 to 4 cigarettes a day   Clinical Intake:  Pre-visit preparation completed: No  Pain : No/denies pain     Nutritional Status: BMI 25 -29 Overweight Diabetes: Yes CBG done?: No Did pt. bring in CBG monitor from home?: No  How often do you need to have someone help you when you read instructions, pamphlets, or other written materials from your doctor or pharmacy?: 1 - Never What is the last grade level you completed in school?: 12th  Interpreter Needed?: No  Information entered by :: Demetrios LollKristen Hudy, RN  Activities of Daily Living In your present state of health, do you have any difficulty performing the following activities: 05/23/2017 05/23/2016  Hearing? N N  Vision? N N  Comment eye exam is up to date -  Difficulty concentrating or making decisions? N N  Walking or climbing stairs? N N  Dressing or bathing? N N  Doing errands, shopping? N N  Preparing Food and eating ? N N  Using the Toilet? N N  In the past six months, have you accidently leaked urine? N N  Do you have problems with loss of bowel control? N N  Managing your Medications? N N  Managing your Finances? N N  Housekeeping or managing your Housekeeping? N N  Some recent data might be hidden     Immunizations and Health Maintenance Immunization History  Administered Date(s) Administered  . Influenza,inj,Quad PF,6+ Mos 12/30/2014  . Influenza-Unspecified 12/22/2016  . Pneumococcal Conjugate-13 09/28/2014  . Tdap 09/28/2014   Health Maintenance Due  Topic Date Due  . FOOT EXAM  04/11/2017  . URINE MICROALBUMIN  04/11/2017    Patient Care Team: Junie SpencerHawks, Christy A, FNP as PCP - General (Nurse Practitioner) Derryl Harborran, Truc Ly, OD as Consulting Physician (Optometry) Mikal PlaneBeacham, Brian, MD as Consulting Physician  No hospitalizations, ER visits, or surgeries this  past year.      Assessment:   This is a routine wellness examination for Ryan LegatoHarden.  Hearing/Vision screen No deficits noted during visit.   Dietary issues and exercise activities discussed: Current Exercise Habits: Home exercise routine, Type of exercise: walking, Time (Minutes): 30, Frequency (Times/Week): 2, Weekly Exercise (Minutes/Week): 60, Intensity: Mild, Exercise limited by: None identified   Diet Eats breakfast, light snack for lunch, and supper Does not drink enough water  Goals    . DIET - INCREASE WATER INTAKE    . Exercise 150 min/wk Moderate Activity      Depression Screen PHQ 2/9 Scores 05/23/2017 02/21/2017 01/23/2017 08/24/2016  PHQ - 2 Score 0 0 3 0  PHQ- 9 Score - - 6 -    Fall Risk Fall Risk  05/23/2017 02/21/2017 01/23/2017 08/24/2016 08/14/2016  Falls  in the past year? No No No No No    Cognitive Function: MMSE - Mini Mental State Exam 05/23/2017 05/23/2016  Orientation to time 5 4  Orientation to Place 5 5  Registration 3 3  Attention/ Calculation 5 5  Recall 3 3  Language- name 2 objects 2 2  Language- repeat 1 1  Language- follow 3 step command 3 3  Language- read & follow direction 1 1  Write a sentence 1 1  Copy design 1 1  Total score 30 29        Screening Tests Health Maintenance  Topic Date Due  . FOOT EXAM  04/11/2017  . URINE MICROALBUMIN  04/11/2017  . OPHTHALMOLOGY EXAM  05/24/2017  . HEMOGLOBIN A1C  08/22/2017  . PNEUMOCOCCAL POLYSACCHARIDE VACCINE (2) 09/28/2019  . COLONOSCOPY  03/11/2023  . TETANUS/TDAP  09/27/2024  . INFLUENZA VACCINE  Completed  . Hepatitis C Screening  Completed  . HIV Screening  Completed       Plan:   Appt scheduled with PCP to follow up on chronic medical conditions including DM Continuous glucose monitor placed. Will remove and download at next visit Increase water intake. Aim for 4 to 6 bottles a day Plan meals and watch sugar and carbohydrate intake Continue to decrease tobacco use and  eventually stop completely Needs foot exam, microalbumin, and repeat potassium at next visit.  Schedule eye exam that is due this month  Needs review of current medications and which medications he is supposed to be taking.   I have personally reviewed and noted the following in the patient's chart:   . Medical and social history . Use of alcohol, tobacco or illicit drugs  . Current medications and supplements . Functional ability and status . Nutritional status . Physical activity . Advanced directives . List of other physicians . Hospitalizations, surgeries, and ER visits in previous 12 months . Vitals . Screenings to include cognitive, depression, and falls . Referrals and appointments  In addition, I have reviewed and discussed with patient certain preventive protocols, quality metrics, and best practice recommendations. A written personalized care plan for preventive services as well as general preventive health recommendations were provided to patient.     Demetrios Loll, RN   05/23/2017

## 2017-05-23 NOTE — Patient Instructions (Signed)
  [name] , Thank you for taking time to come for your Medicare Wellness Visit. I appreciate your ongoing commitment to your health goals. Please review the following plan we discussed and let me know if I can assist you in the future.   These are the goals we discussed: Goals    . DIET - INCREASE WATER INTAKE    . Exercise 150 min/wk Moderate Activity       This is a list of the screening recommended for you and due dates:  Health Maintenance  Topic Date Due  . Complete foot exam   04/11/2017  . Urine Protein Check  04/11/2017  . Eye exam for diabetics  05/24/2017  . Hemoglobin A1C  08/22/2017  . Pneumococcal vaccine (2) 09/28/2019  . Colon Cancer Screening  03/11/2023  . Tetanus Vaccine  09/27/2024  . Flu Shot  Completed  .  Hepatitis C: One time screening is recommended by Center for Disease Control  (CDC) for  adults born from [birth year] through 1965.   Completed  . HIV Screening  Completed   We will remove sensor and download the data at your next visit.

## 2017-05-29 ENCOUNTER — Ambulatory Visit: Payer: Medicare Other | Admitting: Family

## 2017-06-07 ENCOUNTER — Encounter: Payer: Self-pay | Admitting: Family

## 2017-06-07 ENCOUNTER — Ambulatory Visit (INDEPENDENT_AMBULATORY_CARE_PROVIDER_SITE_OTHER): Payer: BLUE CROSS/BLUE SHIELD | Admitting: Family

## 2017-06-07 VITALS — BP 132/80 | HR 72 | Temp 97.0°F | Ht 66.0 in | Wt 194.8 lb

## 2017-06-07 DIAGNOSIS — E119 Type 2 diabetes mellitus without complications: Secondary | ICD-10-CM

## 2017-06-07 DIAGNOSIS — R351 Nocturia: Secondary | ICD-10-CM

## 2017-06-07 DIAGNOSIS — E785 Hyperlipidemia, unspecified: Secondary | ICD-10-CM | POA: Diagnosis not present

## 2017-06-07 DIAGNOSIS — E1169 Type 2 diabetes mellitus with other specified complication: Secondary | ICD-10-CM | POA: Diagnosis not present

## 2017-06-07 DIAGNOSIS — K219 Gastro-esophageal reflux disease without esophagitis: Secondary | ICD-10-CM

## 2017-06-07 DIAGNOSIS — J410 Simple chronic bronchitis: Secondary | ICD-10-CM | POA: Diagnosis not present

## 2017-06-07 DIAGNOSIS — F17200 Nicotine dependence, unspecified, uncomplicated: Secondary | ICD-10-CM

## 2017-06-07 DIAGNOSIS — E669 Obesity, unspecified: Secondary | ICD-10-CM

## 2017-06-07 LAB — CMP14+EGFR
A/G RATIO: 1.6 (ref 1.2–2.2)
ALK PHOS: 78 IU/L (ref 39–117)
ALT: 26 IU/L (ref 0–44)
AST: 16 IU/L (ref 0–40)
Albumin: 4.3 g/dL (ref 3.6–4.8)
BILIRUBIN TOTAL: 0.3 mg/dL (ref 0.0–1.2)
BUN/Creatinine Ratio: 18 (ref 10–24)
BUN: 19 mg/dL (ref 8–27)
CO2: 24 mmol/L (ref 20–29)
Calcium: 9.7 mg/dL (ref 8.6–10.2)
Chloride: 103 mmol/L (ref 96–106)
Creatinine, Ser: 1.04 mg/dL (ref 0.76–1.27)
GFR calc Af Amer: 87 mL/min/{1.73_m2} (ref >59)
GFR calc non Af Amer: 76 mL/min/{1.73_m2} (ref >59)
GLOBULIN, TOTAL: 2.7 g/dL (ref 1.5–4.5)
Glucose: 217 mg/dL — ABNORMAL HIGH (ref 65–99)
POTASSIUM: 4.2 mmol/L (ref 3.5–5.2)
SODIUM: 142 mmol/L (ref 134–144)
Total Protein: 7 g/dL (ref 6.0–8.5)

## 2017-06-07 LAB — BAYER DCA HB A1C WAIVED: HB A1C (BAYER DCA - WAIVED): 8.7 % — ABNORMAL HIGH (ref <7.0)

## 2017-06-07 MED ORDER — METFORMIN HCL 1000 MG PO TABS: 1000.0000 mg | ORAL_TABLET | Freq: Two times a day (BID) | ORAL | 3 refills | Status: DC

## 2017-06-07 MED ORDER — ATORVASTATIN CALCIUM 20 MG PO TABS: 20.0000 mg | ORAL_TABLET | Freq: Every day | ORAL | 11 refills | Status: DC

## 2017-06-07 MED ORDER — GLIMEPIRIDE 4 MG PO TABS: 4.0000 mg | ORAL_TABLET | Freq: Every day | ORAL | 1 refills | Status: DC

## 2017-06-07 MED ORDER — UMECLIDINIUM BROMIDE 62.5 MCG/INH IN AEPB: 1.0000 | INHALATION_SPRAY | Freq: Every day | RESPIRATORY_TRACT | 3 refills | Status: DC

## 2017-06-07 NOTE — Patient Instructions (Signed)
In a few days you may receive a survey in the mail or online from [employer] regarding your visit with us today. Please take a moment to fill this out. Your feedback is very important to our whole office. It can help us better understand your needs as well as improve your experience and satisfaction. Thank you for taking your time to complete it. We care about you.  Ryan Rodneyhristy Hawks, FNP   Diabetes Mellitus and Nutrition When you have diabetes (diabetes mellitus), it is very important to have healthy eating habits because your blood sugar (glucose) levels are greatly affected by what you eat and drink. Eating healthy foods in the appropriate amounts, at about the same times every day, can help you:  Control your blood glucose.  Lower your risk of heart disease.  Improve your blood pressure.  Reach or maintain a healthy weight.  Every person with diabetes is different, and each person has different needs for a meal plan. Your health care provider may recommend that you work with a diet and nutrition specialist (dietitian) to make a meal plan that is best for you. Your meal plan may vary depending on factors such as:  The calories you need.  The medicines you take.  Your weight.  Your blood glucose, blood pressure, and cholesterol levels.  Your activity level.  Other health conditions you have, such as heart or kidney disease.  How do carbohydrates affect me? Carbohydrates affect your blood glucose level more than any other type of food. Eating carbohydrates naturally increases the amount of glucose in your blood. Carbohydrate counting is a method for keeping track of how many carbohydrates you eat. Counting carbohydrates is important to keep your blood glucose at a healthy level, especially if you use insulin or take certain oral diabetes medicines. It is important to know how many carbohydrates you can safely have in each meal. This is different for every person. Your dietitian can help  you calculate how many carbohydrates you should have at each meal and for snack. Foods that contain carbohydrates include:  Bread, cereal, rice, pasta, and crackers.  Potatoes and corn.  Peas, beans, and lentils.  Milk and yogurt.  Fruit and juice.  Desserts, such as cakes, cookies, ice cream, and candy.  How does alcohol affect me? Alcohol can cause a sudden decrease in blood glucose (hypoglycemia), especially if you use insulin or take certain oral diabetes medicines. Hypoglycemia can be a life-threatening condition. Symptoms of hypoglycemia (sleepiness, dizziness, and confusion) are similar to symptoms of having too much alcohol. If your health care provider says that alcohol is safe for you, follow these guidelines:  Limit alcohol intake to no more than 1 drink per day for nonpregnant women and 2 drinks per day for men. One drink equals 12 oz of beer, 5 oz of wine, or 1 oz of hard liquor.  Do not drink on an empty stomach.  Keep yourself hydrated with water, diet soda, or unsweetened iced tea.  Keep in mind that regular soda, juice, and other mixers may contain a lot of sugar and must be counted as carbohydrates.  What are tips for following this plan? Reading food labels  Start by checking the serving size on the label. The amount of calories, carbohydrates, fats, and other nutrients listed on the label are based on one serving of the food. Many foods contain more than one serving per package.  Check the total grams (g) of carbohydrates in one serving. You can calculate the  number of servings of carbohydrates in one serving by dividing the total carbohydrates by 15. For example, if a food has 30 g of total carbohydrates, it would be equal to 2 servings of carbohydrates.  Check the number of grams (g) of saturated and trans fats in one serving. Choose foods that have low or no amount of these fats.  Check the number of milligrams (mg) of sodium in one serving. Most people  should limit total sodium intake to less than 2,300 mg per day.  Always check the nutrition information of foods labeled as "low-fat" or "nonfat". These foods may be higher in added sugar or refined carbohydrates and should be avoided.  Talk to your dietitian to identify your daily goals for nutrients listed on the label. Shopping  Avoid buying canned, premade, or processed foods. These foods tend to be high in fat, sodium, and added sugar.  Shop around the outside edge of the grocery store. This includes fresh fruits and vegetables, bulk grains, fresh meats, and fresh dairy. Cooking  Use low-heat cooking methods, such as baking, instead of high-heat cooking methods like deep frying.  Cook using healthy oils, such as olive, canola, or sunflower oil.  Avoid cooking with butter, cream, or high-fat meats. Meal planning  Eat meals and snacks regularly, preferably at the same times every day. Avoid going long periods of time without eating.  Eat foods high in fiber, such as fresh fruits, vegetables, beans, and whole grains. Talk to your dietitian about how many servings of carbohydrates you can eat at each meal.  Eat 4-6 ounces of lean protein each day, such as lean meat, chicken, fish, eggs, or tofu. 1 ounce is equal to 1 ounce of meat, chicken, or fish, 1 egg, or 1/4 cup of tofu.  Eat some foods each day that contain healthy fats, such as avocado, nuts, seeds, and fish. Lifestyle   Check your blood glucose regularly.  Exercise at least 30 minutes 5 or more days each week, or as told by your health care provider.  Take medicines as told by your health care provider.  Do not use any products that contain nicotine or tobacco, such as cigarettes and e-cigarettes. If you need help quitting, ask your health care provider.  Work with a Veterinary surgeon or diabetes educator to identify strategies to manage stress and any emotional and social challenges. What are some questions to ask my health  care provider?  Do I need to meet with a diabetes educator?  Do I need to meet with a dietitian?  What number can I call if I have questions?  When are the best times to check my blood glucose? Where to find more information:  American Diabetes Association: diabetes.org/food-and-fitness/food  Academy of Nutrition and Dietetics: https://www.vargas.com/  [employer] of Diabetes and Digestive and Kidney Diseases (NIH): FindJewelers.cz Summary  A healthy meal plan will help you control your blood glucose and maintain a healthy lifestyle.  Working with a diet and nutrition specialist (dietitian) can help you make a meal plan that is best for you.  Keep in mind that carbohydrates and alcohol have immediate effects on your blood glucose levels. It is important to count carbohydrates and to use alcohol carefully. This information is not intended to replace advice given to you by your health care provider. Make sure you discuss any questions you have with your health care provider. Document Released: 11/17/2004 Document Revised: 03/27/2016 Document Reviewed: 03/27/2016 Elsevier Interactive Patient Education  [employer].

## 2017-06-07 NOTE — Progress Notes (Signed)
Subjective:    Patient ID: Ryan Mcintyre, male    DOB: 1955-07-30, 62 y.o.   MRN: [id number]  PT presents to the office today for chronic follow up. Diabetes  He presents for his follow-up diabetic visit. He has type 2 diabetes mellitus. His disease course has been stable. There are no hypoglycemic associated symptoms. Pertinent negatives for diabetes include no blurred vision, no foot paresthesias and no visual change. Symptoms are stable. Pertinent negatives for diabetic complications include no CVA, heart disease or nephropathy. Risk factors for coronary artery disease include diabetes mellitus, dyslipidemia, male sex and hypertension. He is following a generally healthy diet. His overall blood glucose range is 130-140 mg/dl. Eye exam is current.  Hyperlipidemia  This is a chronic problem. The current episode started more than 1 year ago. The problem is controlled. Recent lipid tests were reviewed and are normal. Exacerbating diseases include obesity. Current antihyperlipidemic treatment includes statins. The current treatment provides moderate improvement of lipids. Risk factors for coronary artery disease include dyslipidemia, diabetes mellitus, hypertension and male sex.  Gastroesophageal Reflux  He complains of a hoarse voice. He reports no belching, no coughing or no heartburn. This is a chronic problem. The current episode started more than 1 year ago. The problem occurs occasionally. The problem has been waxing and waning. The symptoms are aggravated by lying down, smoking and medications. He has tried a PPI for the symptoms. The treatment provided moderate relief.  COPD Pt states he smokes 1/2 pack a day.  Nocturia  Pt reports having nocturia over the past few years.   Review of Systems  HENT: Positive for hoarse voice.   Eyes: Negative for blurred vision.  Respiratory: Negative for cough.   Gastrointestinal: Negative for heartburn.  All other systems reviewed and are  negative.      Objective:   Physical Exam  Constitutional: He is oriented to person, place, and time. He appears well-developed and well-nourished. No distress.  HENT:  Head: Normocephalic.  Right Ear: External ear normal.  Left Ear: External ear normal.  Nose: Nose normal.  Mouth/Throat: Posterior oropharyngeal erythema present.  Eyes: Pupils are equal, round, and reactive to light. Right eye exhibits no discharge. Left eye exhibits no discharge.  Neck: Normal range of motion. Neck supple. No thyromegaly present.  Cardiovascular: Normal rate, regular rhythm, normal heart sounds and intact distal pulses.  No murmur heard. Pulmonary/Chest: Effort normal. No respiratory distress. He has wheezes.  Abdominal: Soft. Bowel sounds are normal. He exhibits no distension. There is no tenderness.  Musculoskeletal: Normal range of motion. He exhibits no edema or tenderness.  Neurological: He is alert and oriented to person, place, and time.  Skin: Skin is warm and dry. No rash noted. No erythema.  Psychiatric: He has a normal mood and affect. His behavior is normal. Judgment and thought content normal.  Vitals reviewed.     BP 132/80   Pulse 72   Temp (!) 97 F (36.1 C) (Oral)   Ht _0  (1.676 m)   Wt 194 lb 12.8 oz (88.4 kg)   BMI 31.44 kg/m      Assessment & Plan:  1. Hyperlipidemia associated with type 2 diabetes mellitus (HCC) - CMP14+EGFR - atorvastatin (LIPITOR) 20 MG tablet; Take 1 tablet (20 mg total) by mouth daily.  Dispense: 30 tablet; Refill: 11  2. Type 2 diabetes mellitus without complication, without long-term current use of insulin (HCC) - Bayer DCA Hb A1c Waived - CMP14+EGFR - Microalbumin /  creatinine urine ratio - Ambulatory referral to Podiatry - Ambulatory referral to Ophthalmology - metFORMIN (GLUCOPHAGE) 1000 MG tablet; Take 1 tablet (1,000 mg total) by mouth 2 (two) times daily with a meal.  Dispense: 180 tablet; Refill: 3 - glimepiride (AMARYL) 4 MG  tablet; Take 1 tablet (4 mg total) by mouth daily before breakfast.  Dispense: 90 tablet; Refill: 1  3. Gastroesophageal reflux disease, esophagitis presence not specified - CMP14+EGFR  4. Simple chronic bronchitis (HCC) - CMP14+EGFR - umeclidinium bromide (INCRUSE ELLIPTA) 62.5 MCG/INH AEPB; Inhale 1 puff into the lungs daily.  Dispense: 1 each; Refill: 3  5. Obesity (BMI 30-39.9) - CMP14+EGFR  6. Current smoker - CMP14+EGFR  7. Nocturia - CMP14+EGFR - PR PSA, TOTAL SCREENING   Continue all meds Labs pending Health Maintenance reviewed Diet and exercise encouraged RTO 3 month   Evelina Dun, FNP

## 2017-06-08 ENCOUNTER — Other Ambulatory Visit: Payer: Self-pay | Admitting: Family

## 2017-06-08 LAB — MICROALBUMIN / CREATININE URINE RATIO
CREATININE, UR: 224.6 mg/dL
MICROALB/CREAT RATIO: 5.9 mg/g{creat} (ref 0.0–30.0)
MICROALBUM., U, RANDOM: 13.3 ug/mL

## 2017-06-08 MED ORDER — EMPAGLIFLOZIN 10 MG PO TABS: 10.0000 mg | ORAL_TABLET | Freq: Every day | ORAL | 1 refills | Status: DC

## 2017-06-12 ENCOUNTER — Telehealth: Payer: Self-pay

## 2017-06-12 MED ORDER — TIOTROPIUM BROMIDE MONOHYDRATE 18 MCG IN CAPS: 18.0000 ug | ORAL_CAPSULE | Freq: Every day | RESPIRATORY_TRACT | 1 refills | Status: DC

## 2017-06-12 NOTE — Telephone Encounter (Signed)
Incruse changed to Spiriva per insurance. Prescription sent to pharmacy.

## 2017-06-12 NOTE — Telephone Encounter (Signed)
Insurance denied Incruse Ellipta   Needs to try Spiriva or [place]

## 2017-06-12 NOTE — Telephone Encounter (Signed)
Pharmacy aware

## 2018-01-18 DIAGNOSIS — Z23 Encounter for immunization: Secondary | ICD-10-CM | POA: Diagnosis not present

## 2018-08-21 ENCOUNTER — Other Ambulatory Visit: Payer: Self-pay

## 2018-08-21 ENCOUNTER — Ambulatory Visit: Payer: Medicare HMO

## 2018-08-21 NOTE — Progress Notes (Signed)
Erroneous encounter

## 2018-10-11 IMAGING — CT CT CHEST LUNG CANCER SCREENING LOW DOSE W/O CM
2 of 4 series · 15 of 40 positions shown, 18 images · non-contrast
Comparison: No priors.

CLINICAL DATA: 61-year-old male current smoker with 100 pack-year
history of smoking. Lung cancer screening examination.

EXAM:
CT CHEST WITHOUT CONTRAST LOW-DOSE FOR LUNG CANCER SCREENING
TECHNIQUE: Multidetector CT imaging of the chest was performed following the
standard protocol without IV contrast.

[Series 2: axial st · axial · 0.79mm/px · z∈[-324,-54]mm · 12 of 64 slices shown, 15 images]
[im 5/64  mediastinal]
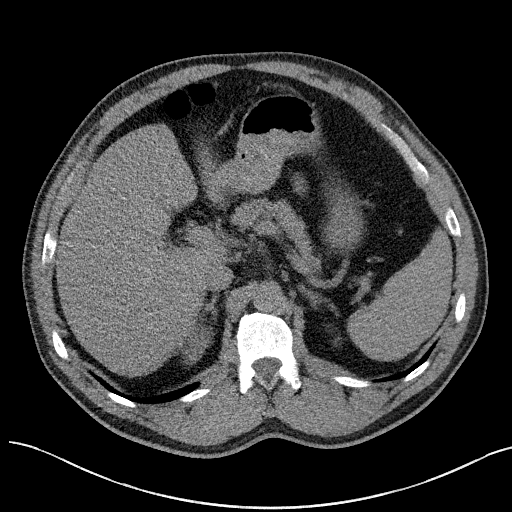
[im 5/64  lung]
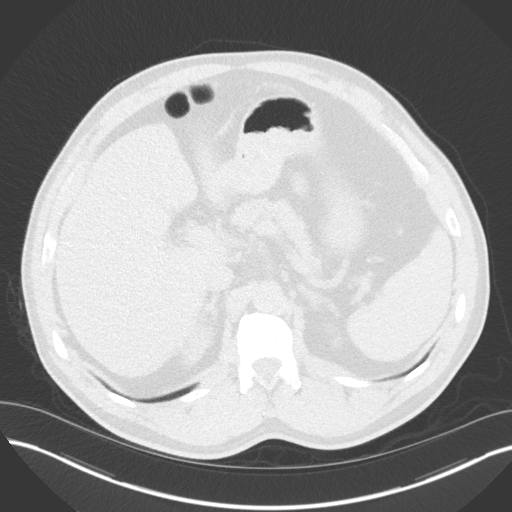
[im 10/64  lung]
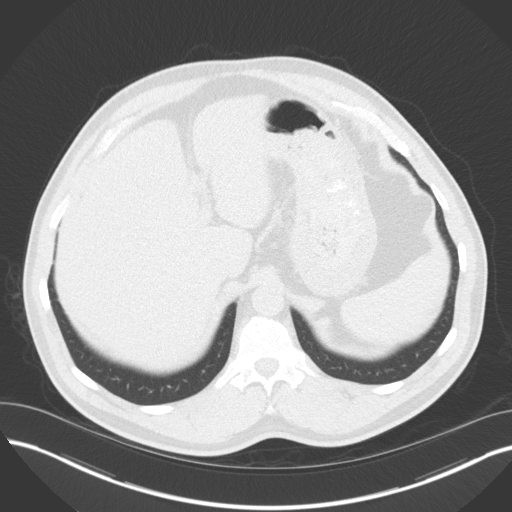
[im 15/64  lung]
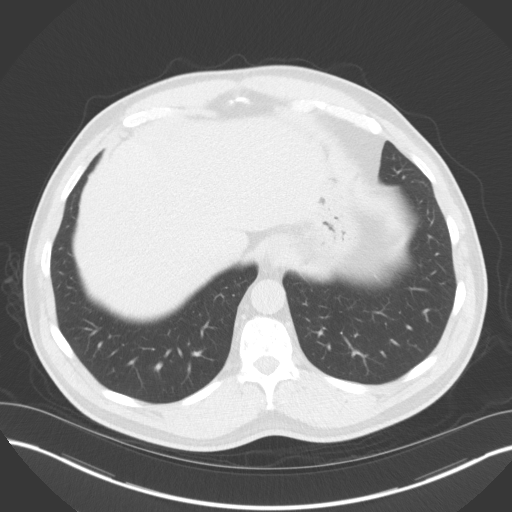
[im 20/64  lung]
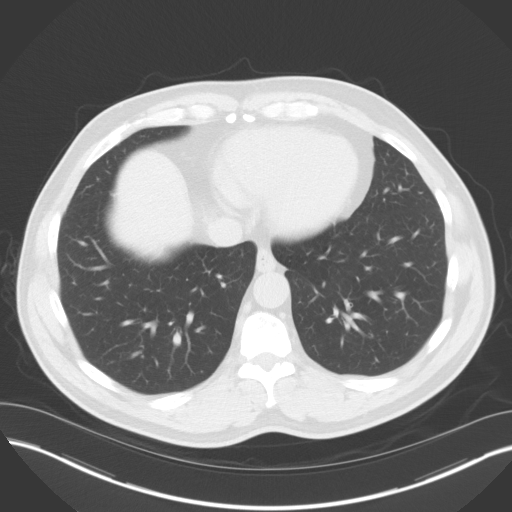
[im 25/64  mediastinal]
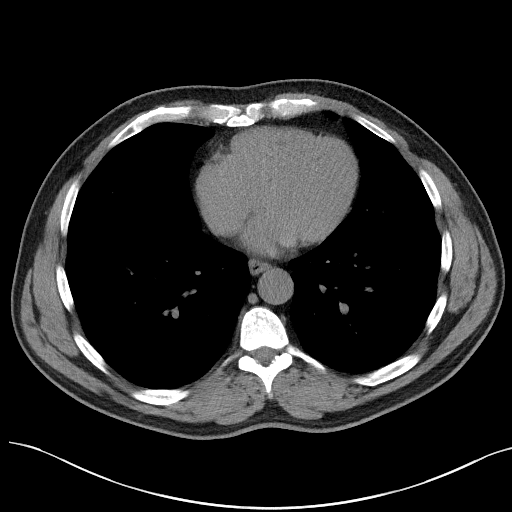
[im 25/64  lung]
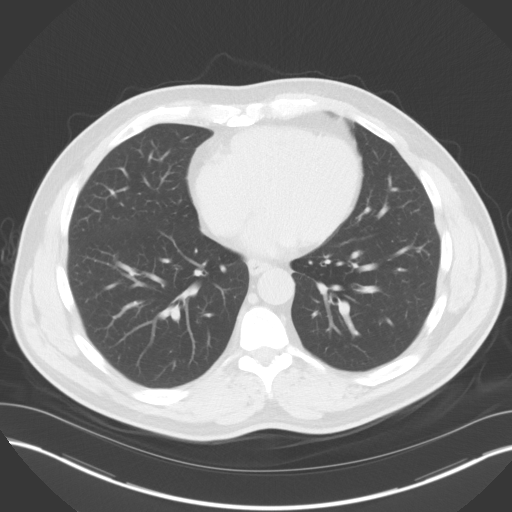
[im 30/64  lung]
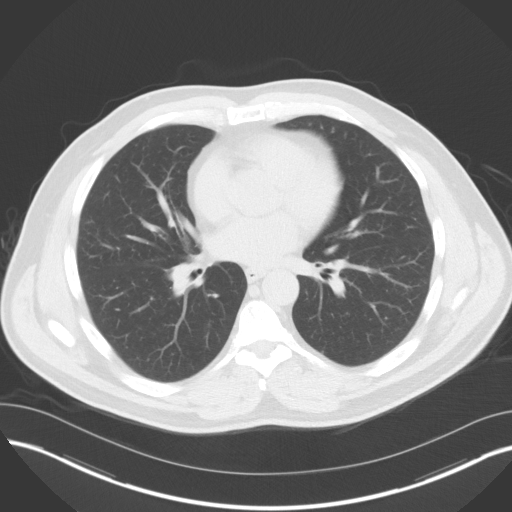
[im 34/64  lung]
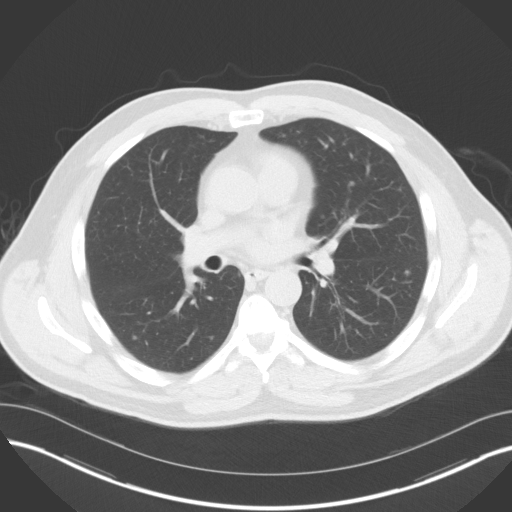
[im 39/64  lung]
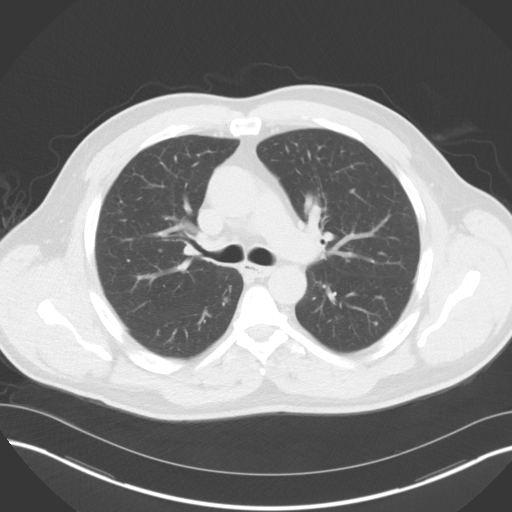
[im 44/64  mediastinal]
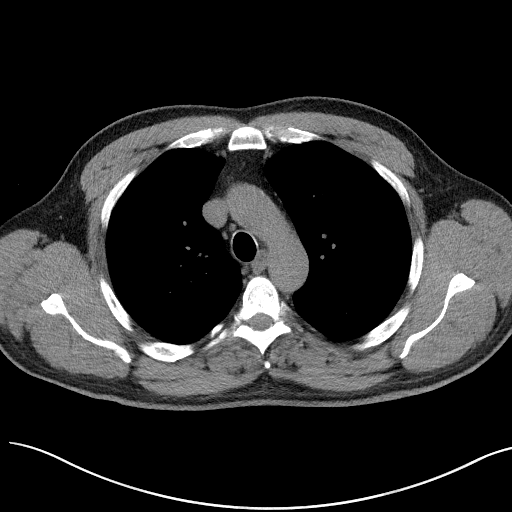
[im 44/64  lung]
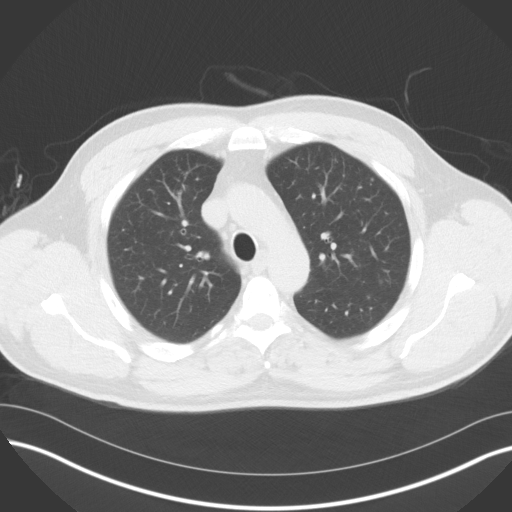
[im 49/64  lung]
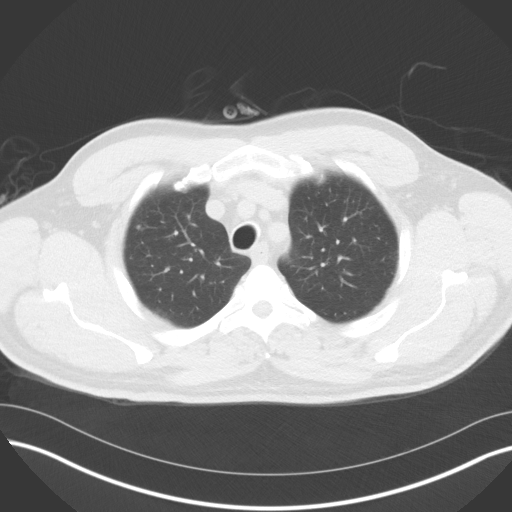
[im 54/64  lung]
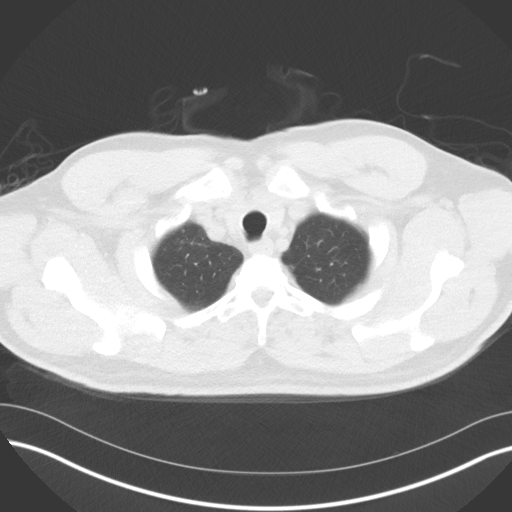
[im 59/64  lung]
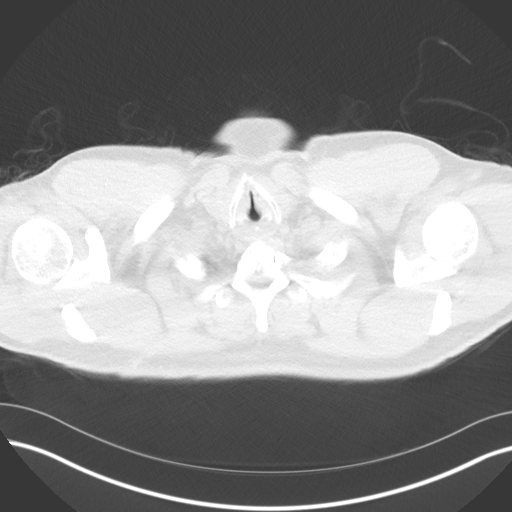

[Series 5: coronal · coronal · 0.64mm/px · 3 of 257 slices shown]
[im 52/257  lung]
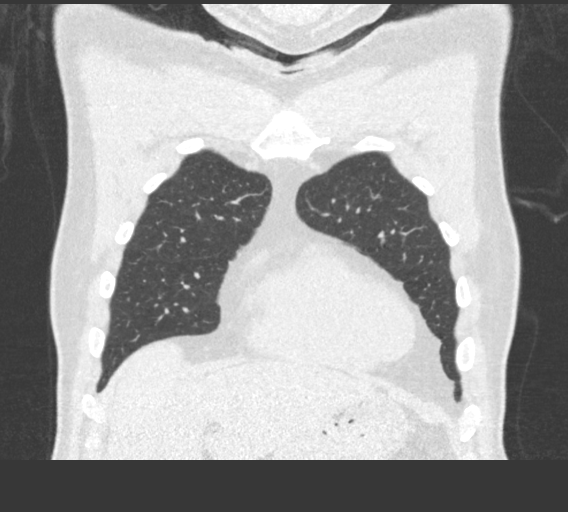
[im 103/257  lung]
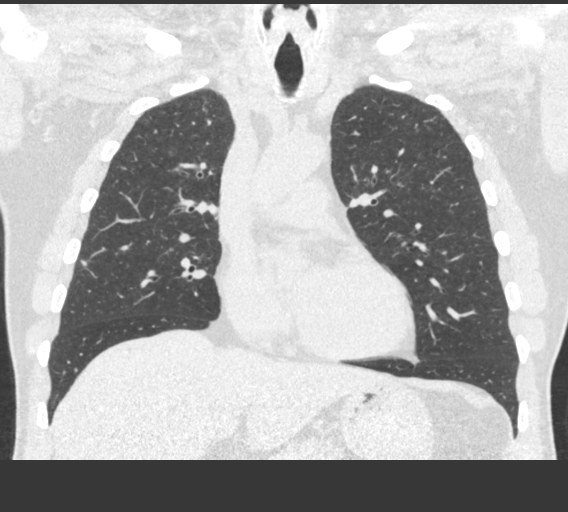
[im 154/257  lung]
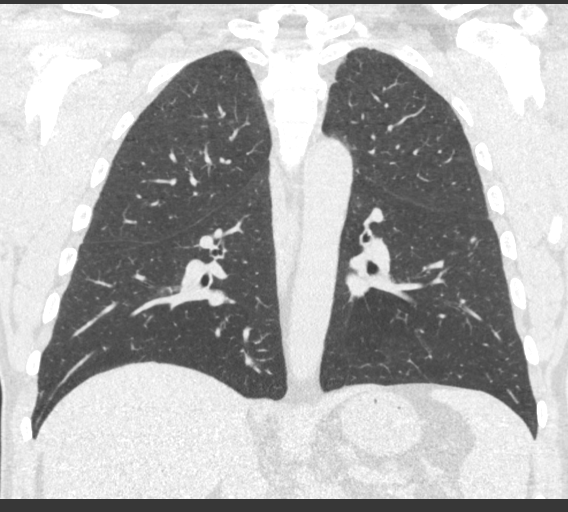

[15 of 40 positions shown; findings below may reference images not displayed]

FINDINGS: Cardiovascular: Heart size is normal. There is no significant
pericardial fluid, thickening or pericardial calcification. No
atherosclerotic calcifications are identified within the thoracic
aorta or the coronary arteries.

Mediastinum/Nodes: No pathologically enlarged mediastinal or hilar
lymph nodes. Please note that accurate exclusion of hilar adenopathy
is limited on noncontrast CT scans. Esophagus is unremarkable in
appearance. No axillary lymphadenopathy.

Lungs/Pleura: There are more than 40 tiny pulmonary nodules
scattered throughout the lungs bilaterally, largest of which has a
volume derived mean diameter of only 4.0 mm in the medial aspect of
the left lower lobe (axial image 147 of series 3). No other larger
more suspicious appearing pulmonary nodules or masses are noted.
There is no acute consolidative airspace disease and no pleural
effusions. Mild diffuse bronchial wall thickening with very mild
centrilobular and paraseptal emphysema.

Upper Abdomen: Mild diffuse low attenuation throughout the
visualized hepatic parenchyma, compatible with a background of mild
hepatic steatosis.

Musculoskeletal: There are no aggressive appearing lytic or blastic
lesions noted in the visualized portions of the skeleton.
IMPRESSION: 1. Lung-RADS Category 2, benign appearance or behavior. Continue
annual screening with low-dose chest CT without contrast in 12
months.
2. Mild diffuse bronchial wall thickening with very mild
centrilobular and paraseptal emphysema; imaging findings suggestive
of underlying COPD.
3. Mild hepatic steatosis.

## 2018-12-06 ENCOUNTER — Encounter: Payer: Self-pay | Admitting: Family

## 2018-12-06 ENCOUNTER — Other Ambulatory Visit: Payer: Self-pay

## 2018-12-06 ENCOUNTER — Ambulatory Visit (INDEPENDENT_AMBULATORY_CARE_PROVIDER_SITE_OTHER): Payer: Medicare HMO | Admitting: Family

## 2018-12-06 VITALS — BP 120/76 | HR 82 | Temp 97.8°F | Ht 66.0 in | Wt 176.0 lb

## 2018-12-06 DIAGNOSIS — L0291 Cutaneous abscess, unspecified: Secondary | ICD-10-CM | POA: Diagnosis not present

## 2018-12-06 DIAGNOSIS — Z23 Encounter for immunization: Secondary | ICD-10-CM | POA: Diagnosis not present

## 2018-12-06 MED ORDER — CEFTRIAXONE SODIUM 1 G IJ SOLR
1.0000 g | Freq: Once | INTRAMUSCULAR | Status: AC
  Administered 2018-12-06: 1 g via INTRAMUSCULAR

## 2018-12-06 MED ORDER — DOXYCYCLINE HYCLATE 100 MG PO TABS: 100.0000 mg | ORAL_TABLET | Freq: Two times a day (BID) | ORAL | 0 refills | Status: DC

## 2018-12-06 NOTE — Progress Notes (Signed)
   Subjective:    Patient ID: Ryan Mcintyre, male    DOB: 1956-02-09, 63 y.o.   MRN: [id number]  Chief Complaint  Patient presents with  . boil on buttock    HPI PT presents to the office today with complaints of an abscess on right buttocks that he noticed 3 days ago. He reports his pain about a 8 when sitting and when he is standing it is about a 3 out 10. He has not taken anything for it.    Review of Systems  Constitutional: Positive for fever.  Skin: Positive for wound.  All other systems reviewed and are negative.      Objective:   Physical Exam Vitals signs reviewed.  Constitutional:      General: He is not in acute distress.    Appearance: He is well-developed.  HENT:     Head: Normocephalic.     Right Ear: Tympanic membrane normal.     Left Ear: Tympanic membrane normal.  Eyes:     General:        Right eye: No discharge.        Left eye: No discharge.     Pupils: Pupils are equal, round, and reactive to light.  Neck:     Musculoskeletal: Normal range of motion and neck supple.     Thyroid: No thyromegaly.  Cardiovascular:     Rate and Rhythm: Normal rate and regular rhythm.     Heart sounds: Normal heart sounds. No murmur.  Pulmonary:     Effort: Pulmonary effort is normal. No respiratory distress.     Breath sounds: Normal breath sounds. No wheezing.  Abdominal:     General: Bowel sounds are normal. There is no distension.     Palpations: Abdomen is soft.     Tenderness: There is no abdominal tenderness.  Musculoskeletal: Normal range of motion.        General: No tenderness.  Skin:    General: Skin is warm and dry.     Findings: Abscess present. No erythema or rash.          Comments: Hard abscess on right buttocks  Neurological:     Mental Status: He is alert and oriented to person, place, and time.     Cranial Nerves: No cranial nerve deficit.     Deep Tendon Reflexes: Reflexes are normal and symmetric.  Psychiatric:        Behavior: Behavior  normal.        Thought Content: Thought content normal.        Judgment: Judgment normal.       BP 120/76   Pulse 82   Temp 97.8 F (36.6 C) (Temporal)   Ht 5\' 6"  (1.676 m)   Wt 176 lb (79.8 kg)   SpO2 97%   BMI 28.41 kg/m      Assessment & Plan:  1. Need for immunization against influenza - Flu Vaccine QUAD High Dose(Fluad)  2. Abscess Worrisome for a perianal abscess.  Will given Rocephin and start doxycyline today.  He will soak in hot bath and use hot compresses.  RTO on 12/09/18, if not improved will do referral to general surgery  Discussed if pain worsens over the weekend to go to ED - cefTRIAXone (ROCEPHIN) injection 1 g - doxycycline (VIBRA-TABS) 100 MG tablet; Take 1 tablet (100 mg total) by mouth 2 (two) times daily.  Dispense: 20 tablet; Refill: 0

## 2018-12-06 NOTE — Patient Instructions (Signed)
Skin Abscess  A skin abscess is an infected area on or under your skin that contains a collection of pus and other material. An abscess may also be called a furuncle, carbuncle, or boil. An abscess can occur in or on almost any part of your body. Some abscesses break open (rupture) on their own. Most continue to get worse unless they are treated. The infection can spread deeper into the body and eventually into your blood, which can make you feel ill. Treatment usually involves draining the abscess. What are the causes? An abscess occurs when germs, like bacteria, pass through your skin and cause an infection. This may be caused by:  A scrape or cut on your skin.  A puncture wound through your skin, including a needle injection or insect bite.  Blocked oil or sweat glands.  Blocked and infected hair follicles.  A cyst that forms beneath your skin (sebaceous cyst) and becomes infected. What increases the risk? This condition is more likely to develop in people who:  Have a weak body defense system (immune system).  Have diabetes.  Have dry and irritated skin.  Get frequent injections or use illegal IV drugs.  Have a foreign body in a wound, such as a splinter.  Have problems with their lymph system or veins. What are the signs or symptoms? Symptoms of this condition include:  A painful, firm bump under the skin.  A bump with pus at the top. This may break through the skin and drain. Other symptoms include:  Redness surrounding the abscess site.  Warmth.  Swelling of the lymph nodes (glands) near the abscess.  Tenderness.  A sore on the skin. How is this diagnosed? This condition may be diagnosed based on:  A physical exam.  Your medical history.  A sample of pus. This may be used to find out what is causing the infection.  Blood tests.  Imaging tests, such as an ultrasound, CT scan, or MRI. How is this treated? A small abscess that drains on its own may not  need treatment. Treatment for larger abscesses may include:  Moist heat or heat pack applied to the area several times a day.  A procedure to drain the abscess (incision and drainage).  Antibiotic medicines. For a severe abscess, you may first get antibiotics through an IV and then change to antibiotics by mouth. Follow these instructions at home: Medicines   Take over-the-counter and prescription medicines only as told by your health care provider.  If you were prescribed an antibiotic medicine, take it as told by your health care provider. Do not stop taking the antibiotic even if you start to feel better. Abscess care   If you have an abscess that has not drained, apply heat to the affected area. Use the heat source that your health care provider recommends, such as a moist heat pack or a heating pad. ? Place a towel between your skin and the heat source. ? Leave the heat on for 20-30 minutes. ? Remove the heat if your skin turns bright red. This is especially important if you are unable to feel pain, heat, or cold. You may have a greater risk of getting burned.  Follow instructions from your health care provider about how to take care of your abscess. Make sure you: ? Cover the abscess with a bandage (dressing). ? Change your dressing or gauze as told by your health care provider. ? Wash your hands with soap and water before you change the   dressing or gauze. If soap and water are not available, use hand sanitizer.  Check your abscess every day for signs of a worsening infection. Check for: ? More redness, swelling, or pain. ? More fluid or blood. ? Warmth. ? More pus or a bad smell. General instructions  To avoid spreading the infection: ? Do not share personal care items, towels, or hot tubs with others. ? Avoid making skin contact with other people.  Keep all follow-up visits as told by your health care provider. This is important. Contact a health care provider if you  have:  More redness, swelling, or pain around your abscess.  More fluid or blood coming from your abscess.  Warm skin around your abscess.  More pus or a bad smell coming from your abscess.  A fever.  Muscle aches.  Chills or a general ill feeling. Get help right away if you:  Have severe pain.  See red streaks on your skin spreading away from the abscess. Summary  A skin abscess is an infected area on or under your skin that contains a collection of pus and other material.  A small abscess that drains on its own may not need treatment.  Treatment for larger abscesses may include having a procedure to drain the abscess and taking an antibiotic. This information is not intended to replace advice given to you by your health care provider. Make sure you discuss any questions you have with your health care provider. Document Released: 11/30/2004 Document Revised: 06/13/2018 Document Reviewed: 04/05/2017 Elsevier Patient Education  2020 [employer] Inc[employer]  

## 2018-12-09 ENCOUNTER — Other Ambulatory Visit: Payer: Self-pay

## 2018-12-10 ENCOUNTER — Encounter: Payer: Self-pay | Admitting: Family

## 2018-12-10 ENCOUNTER — Ambulatory Visit (INDEPENDENT_AMBULATORY_CARE_PROVIDER_SITE_OTHER): Payer: Medicare HMO | Admitting: Family

## 2018-12-10 VITALS — BP 132/89 | HR 88 | Temp 97.5°F | Ht 66.0 in | Wt 175.2 lb

## 2018-12-10 DIAGNOSIS — K610 Anal abscess: Secondary | ICD-10-CM

## 2018-12-10 NOTE — Patient Instructions (Signed)
Anorectal Abscess An abscess is an infected area that contains a collection of pus. An anorectal abscess is an abscess that is near the opening of the anus or around the rectum. Without treatment, an anorectal abscess can become larger and cause other problems, such as a more serious body-wide infection or pain, especially during bowel movements. What are the causes? This condition is caused by plugged glands or an infection in one of these areas:  The anus.  The area between the anus and the scrotum in males or between the anus and the vagina in females (perineum). What increases the risk? The following factors may make you more likely to develop this condition:  Diabetes or inflammatory bowel disease.  Having a body defense system (immune system) that is weak.  Engaging in anal sex.  Having a sexually transmitted infection (STI).  Certain kinds of cancer, such as rectal carcinoma, leukemia, or lymphoma. What are the signs or symptoms? The main symptom of this condition is pain. The pain may be a throbbing pain that gets worse during bowel movements. Other symptoms include:  Swelling and redness in the area of the abscess. The redness may go beyond the abscess and appear as a red streak on the skin.  A visible, painful lump, or a lump that can be felt when touched.  Bleeding or pus-like discharge from the area.  Fever.  General weakness.  Constipation.  Diarrhea. How is this diagnosed? This condition is diagnosed based on your medical history and a physical exam of the affected area.  This may involve examining the rectal area with a gloved hand (digital rectal exam).  Sometimes, the health care provider needs to look into the rectum using a probe, scope, or imaging test.  For women, it may require a careful vaginal exam. How is this treated? Treatment for this condition may include:  Incision and drainage surgery. This involves making an incision over the abscess to  drain the pus.  Medicines, including antibiotic medicine, pain medicine, stool softeners, or laxatives. Follow these instructions at home: Medicines  Take over-the-counter and prescription medicines only as told by your health care provider.  If you were prescribed an antibiotic medicine, use it as told by your health care provider. Do not stop using the antibiotic even if you start to feel better.  Do not drive or use heavy machinery while taking prescription pain medicine. Wound care   If gauze was used in the abscess, follow instructions from your health care provider about removing or changing the gauze. It can usually be removed in 2-3 days.  Wash your hands with soap and water before you remove or change your gauze. If soap and water are not available, use hand sanitizer.  If one or more drains were placed in the abscess cavity, be careful not to pull at them. Your health care provider will tell you how long they need to remain in place.  Check your incision area every day for signs of infection. Check for: ? More redness, swelling, or pain. ? More fluid or blood. ? Warmth. ? Pus or a bad smell. Managing pain, stiffness, and swelling   Take a sitz bath 3-4 times a day and after bowel movements. This will help reduce pain and swelling.  To relieve pain, try sitting: ? On a heating pad with the setting on low. ? On an inflatable donut-shaped cushion.  If directed, put ice on the affected area: ? Put ice in a plastic bag. ? Place   a towel between your skin and the bag. ? Leave the ice on for 20 minutes, 2-3 times a day. General instructions  Follow any diet instructions given by your health care provider.  Keep all follow-up visits as told by your health care provider. This is important. Contact a health care provider if you have:  Bleeding from your incision.  Pain, swelling, or redness that does not improve or gets worse.  Trouble passing stool or urine.   Symptoms that return after treatment. Get help right away if you:  Have problems moving or using your legs.  Have severe or increasing pain.  Have swelling in the affected area that suddenly gets worse.  Have a large increase in bleeding or passing of pus.  Develop chills or a fever. Summary  An anorectal abscess is an abscess that is near the opening of the anus or around the rectum. An abscess is an infected area that contains a collection of pus.  The main symptom of this condition is pain. It may be a throbbing pain that gets worse during bowel movements.  Treatment for an anorectal abscess may include surgery to drain the pus from the abscess. Medicines and sitz baths may also be a part of your treatment plan. This information is not intended to replace advice given to you by your health care provider. Make sure you discuss any questions you have with your health care provider. Document Released: 02/18/2000 Document Revised: 03/29/2017 Document Reviewed: 03/29/2017 Elsevier Patient Education  2020 Else[employer] Inc.  

## 2018-12-10 NOTE — Progress Notes (Signed)
   Subjective:    Patient ID: Ryan Mcintyre, male    DOB: 19-Jul-1955, 63 y.o.   MRN: [id number]  Chief Complaint  Patient presents with  . recheck boil    HPI PT presents to the office today to recheck an abscess. He was seen on 12/06/18 and started on doxycyline and given Rocephin 1 g. He reports the area has greatly improved and denies any pain. He states the area never drained, but just went away. He states he sat in a hot bath tub three times a day that helped.    Review of Systems  All other systems reviewed and are negative.      Objective:   Physical Exam Vitals signs reviewed.  Constitutional:      General: He is not in acute distress.    Appearance: He is well-developed.  HENT:     Head: Normocephalic.  Eyes:     General:        Right eye: No discharge.        Left eye: No discharge.     Pupils: Pupils are equal, round, and reactive to light.  Neck:     Musculoskeletal: Normal range of motion and neck supple.     Thyroid: No thyromegaly.  Cardiovascular:     Rate and Rhythm: Normal rate and regular rhythm.     Heart sounds: Normal heart sounds. No murmur.  Pulmonary:     Effort: Pulmonary effort is normal. No respiratory distress.     Breath sounds: Normal breath sounds. No wheezing.  Abdominal:     General: Bowel sounds are normal. There is no distension.     Palpations: Abdomen is soft.     Tenderness: There is no abdominal tenderness.  Genitourinary:      Comments: Hard abscess present  Skin:    General: Skin is warm and dry.     Findings: Abscess present. No erythema or rash.  Neurological:     Mental Status: He is alert and oriented to person, place, and time.     Cranial Nerves: No cranial nerve deficit.     Deep Tendon Reflexes: Reflexes are normal and symmetric.  Psychiatric:        Behavior: Behavior normal.        Thought Content: Thought content normal.        Judgment: Judgment normal.       BP 132/89   Pulse 88   Temp (!) 97.5 F  (36.4 C) (Temporal)   Ht 5\' 6"  (1.676 m)   Wt 175 lb 3.2 oz (79.5 kg)   SpO2 95%   BMI 28.28 kg/m      Assessment & Plan:  1. Perianal abscess Area still very hard and is very close to the rectum. We will not do I&D today and do a referral to General surgery Continue doxycyline  Continue warm soaks Keep clean and dry Do not squeeze or pick at - Ambulatory referral to [facility], [place]

## 2019-01-02 ENCOUNTER — Ambulatory Visit: Payer: Medicare HMO | Admitting: General Surgery

## 2019-01-09 ENCOUNTER — Encounter: Payer: Self-pay | Admitting: General Surgery

## 2019-01-09 ENCOUNTER — Ambulatory Visit (INDEPENDENT_AMBULATORY_CARE_PROVIDER_SITE_OTHER): Payer: Medicare HMO | Admitting: General Surgery

## 2019-01-09 ENCOUNTER — Other Ambulatory Visit: Payer: Self-pay

## 2019-01-09 VITALS — BP 143/91 | HR 95 | Temp 97.8°F | Resp 16 | Ht 66.0 in | Wt 172.0 lb

## 2019-01-09 DIAGNOSIS — K610 Anal abscess: Secondary | ICD-10-CM | POA: Diagnosis not present

## 2019-01-09 NOTE — Progress Notes (Signed)
Ryan Mcintyre; [id number]; [birth year]   HPI Patient is a 63 year old white male who was referred to my care by Evelina Dun for evaluation and treatment of a perianal abscess.  Patient was seen by his primary care physician several times for a swelling in the perianal region.  He was started on antibiotics.  Today, he states he now can sit down and the swelling seems to have resolved.  He denies any drainage occurring.  He last had a colonoscopy many years ago which was unremarkable as per patient.  It was done in [facility].  This is the first time he has had any episodes of perianal infection.  He currently has 0 out of 10 rectal pain. Past Medical History:  Diagnosis Date  . COPD (chronic obstructive pulmonary disease) ([place]) 02/21/2017  . Diabetes mellitus without complication ([place])   . GERD (gastroesophageal reflux disease)   . Hyperlipidemia     Past Surgical History:  Procedure Laterality Date  . CIRCUMCISION  1974    Family History  Problem Relation Age of Onset  . Hypertension Mother   . Diabetes Mother   . Diabetes Sister   . Early death Sister   . Early death Brother   . Early death Brother     Current Outpatient Medications on File Prior to Visit  Medication Sig Dispense Refill  . aspirin EC 81 MG tablet Take 1 tablet (81 mg total) by mouth daily.    Marland Kitchen doxycycline (VIBRA-TABS) 100 MG tablet Take 1 tablet (100 mg total) by mouth 2 (two) times daily. 20 tablet 0  . empagliflozin (JARDIANCE) 10 MG TABS tablet Take 10 mg by mouth daily. 90 tablet 1  . glimepiride (AMARYL) 4 MG tablet Take 1 tablet (4 mg total) by mouth daily before breakfast. 90 tablet 1  . glucose blood test strip Test daily. 100 each 12  . metFORMIN (GLUCOPHAGE) 1000 MG tablet Take 1 tablet (1,000 mg total) by mouth 2 (two) times daily with a meal. 180 tablet 3  . OMEPRAZOLE PO Take by mouth.    . tiotropium (SPIRIVA HANDIHALER) 18 MCG inhalation capsule Place 1 capsule (18 mcg total) into  inhaler and inhale daily. 90 capsule 1  . atorvastatin (LIPITOR) 20 MG tablet Take 1 tablet (20 mg total) by mouth daily. 30 tablet 11   No current facility-administered medications on file prior to visit.     No Known Allergies  Social History   Substance and Sexual Activity  Alcohol Use No  . Alcohol/week: 0.0 standard drinks    Social History   Tobacco Use  Smoking Status Current Every Day Smoker  . Packs/day: 0.25  . Years: 50.00  . Pack years: 12.50  . Types: Cigarettes  Smokeless Tobacco Never Used  Tobacco Comment   about 3 to 4 cigarettes a day    Review of Systems  Constitutional: Negative.   HENT: Negative.   Eyes: Positive for blurred vision.  Respiratory: Negative.   Cardiovascular: Negative.   Gastrointestinal: Positive for heartburn.  Genitourinary: Positive for frequency.  Musculoskeletal: Negative.   Skin: Negative.   Neurological: Negative.   Endo/Heme/Allergies: Negative.   Psychiatric/Behavioral: Negative.     Objective   Vitals:   01/09/19 1019  BP: (!) 143/91  Pulse: 95  Resp: 16  Temp: 97.8 F (36.6 C)  SpO2: 94%    Physical Exam Vitals signs reviewed.  Constitutional:      Appearance: Normal appearance. He is normal weight. He is not ill-appearing.  HENT:     Head: Normocephalic and atraumatic.  Cardiovascular:     Rate and Rhythm: Normal rate and regular rhythm.     Heart sounds: Normal heart sounds. No murmur. No friction rub. No gallop.   Pulmonary:     Effort: Pulmonary effort is normal. No respiratory distress.     Breath sounds: Normal breath sounds. No stridor. No wheezing, rhonchi or rales.  Genitourinary:    Comments: Small indurated subcutaneous area at the 1 o'clock position outside the anal verge.  No fistulous tract appreciated.  No drainage is noted.  No fluctuance is noted. Skin:    General: Skin is warm and dry.  Neurological:     Mental Status: He is alert and oriented to person, place, and time.     Primary care notes reviewed Assessment  Perianal abscess, resolved Plan   No further surgical intervention warranted at this time.  Patient was instructed to follow-up with me should he have a recurrence of the perianal abscess.

## 2019-01-09 NOTE — Patient Instructions (Addendum)
Epidermal Cyst  An epidermal cyst is a small, painless lump under your skin. The cyst contains a grayish-white, bad-smelling substance (keratin). Do not try to pop or open an epidermal cyst yourself. What are the causes?  A blocked hair follicle.  A hair that curls and re-enters the skin instead of growing straight out of the skin.  A blocked pore.  Irritated skin.  An injury to the skin.  Certain conditions that are passed along from parent to child (inherited).  Human papillomavirus (HPV).  Long-term sun damage to the skin. What increases the risk?  Having acne.  Being overweight.  Being 65-52 years old. What are the signs or symptoms? These cysts are usually harmless, but they can get infected. Symptoms of infection may include:  Redness.  Inflammation.  Tenderness.  Warmth.  Fever.  A grayish-white, bad-smelling substance drains from the cyst.  Pus drains from the cyst. How is this treated? In many cases, epidermal cysts go away on their own without treatment. If a cyst becomes infected, treatment may include:  Opening and draining the cyst, done by a doctor. After draining, you may need minor surgery to remove the rest of the cyst.  Antibiotic medicine.  Shots of medicines (steroids) that help to reduce inflammation.  Surgery to remove the cyst. Surgery may be done if the cyst: ? Becomes large. ? Bothers you. ? Has a chance of turning into cancer.  Do not try to open a cyst yourself. Follow these instructions at home:  Take over-the-counter and prescription medicines only as told by your doctor.  If you were prescribed an antibiotic medicine, take it it as told by your doctor. Do not stop using the antibiotic even if you start to feel better.  Keep the area around your cyst clean and dry.  Wear loose, dry clothing.  Avoid touching your cyst.  Check your cyst every day for signs of infection. Check for: ? Redness, swelling, or pain. ? Fluid  or blood. ? Warmth. ? Pus or a bad smell.  Keep all follow-up visits as told by your doctor. This is important. How is this prevented?  Wear clean, dry, clothing.  Avoid wearing tight clothing.  Keep your skin clean and dry. Take showers or baths every day. Contact a doctor if:  Your cyst has symptoms of infection.  Your condition does not improve or gets worse.  You have a cyst that looks different from other cysts you have had.  You have a fever. Get help right away if:  Redness spreads from the cyst into the area close by. Summary  An epidermal cyst is a sac made of skin tissue.  If a cyst becomes infected, treatment may include surgery to open and drain the cyst, or to remove it.  Take over-the-counter and prescription medicines only as told by your doctor.  Contact a doctor if your condition is not improving or is getting worse.  Keep all follow-up visits as told by your doctor. This is important. This information is not intended to replace advice given to you by your health care provider. Make sure you discuss any questions you have with your health care provider. Document Released: 03/30/2004 Document Revised: 06/13/2018 Document Reviewed: 11/29/2017 Elsevier Patient Education  2020 [employer] Anorectal Abscess An abscess is an infected area that contains a collection of pus. An anorectal abscess is an abscess that is near the opening of the anus or around the rectum. Without treatment, an anorectal abscess can become larger  and cause other problems, such as a more serious body-wide infection or pain, especially during bowel movements. What are the causes? This condition is caused by plugged glands or an infection in one of these areas:  The anus.  The area between the anus and the scrotum in males or between the anus and the vagina in females (perineum). What increases the risk? The following factors may make you more likely to develop this condition:   Diabetes or inflammatory bowel disease.  Having a body defense system (immune system) that is weak.  Engaging in anal sex.  Having a sexually transmitted infection (STI).  Certain kinds of cancer, such as rectal carcinoma, leukemia, or lymphoma. What are the signs or symptoms? The main symptom of this condition is pain. The pain may be a throbbing pain that gets worse during bowel movements. Other symptoms include:  Swelling and redness in the area of the abscess. The redness may go beyond the abscess and appear as a red streak on the skin.  A visible, painful lump, or a lump that can be felt when touched.  Bleeding or pus-like discharge from the area.  Fever.  General weakness.  Constipation.  Diarrhea. How is this diagnosed? This condition is diagnosed based on your medical history and a physical exam of the affected area.  This may involve examining the rectal area with a gloved hand (digital rectal exam).  Sometimes, the health care provider needs to look into the rectum using a probe, scope, or imaging test.  For women, it may require a careful vaginal exam. How is this treated? Treatment for this condition may include:  Incision and drainage surgery. This involves making an incision over the abscess to drain the pus.  Medicines, including antibiotic medicine, pain medicine, stool softeners, or laxatives. Follow these instructions at home: Medicines  Take over-the-counter and prescription medicines only as told by your health care provider.  If you were prescribed an antibiotic medicine, use it as told by your health care provider. Do not stop using the antibiotic even if you start to feel better.  Do not drive or use heavy machinery while taking prescription pain medicine. Wound care   If gauze was used in the abscess, follow instructions from your health care provider about removing or changing the gauze. It can usually be removed in 2-3 days.  Wash your  hands with soap and water before you remove or change your gauze. If soap and water are not available, use hand sanitizer.  If one or more drains were placed in the abscess cavity, be careful not to pull at them. Your health care provider will tell you how long they need to remain in place.  Check your incision area every day for signs of infection. Check for: ? More redness, swelling, or pain. ? More fluid or blood. ? Warmth. ? Pus or a bad smell. Managing pain, stiffness, and swelling   Take a sitz bath 3-4 times a day and after bowel movements. This will help reduce pain and swelling.  To relieve pain, try sitting: ? On a heating pad with the setting on low. ? On an inflatable donut-shaped cushion.  If directed, put ice on the affected area: ? Put ice in a plastic bag. ? Place a towel between your skin and the bag. ? Leave the ice on for 20 minutes, 2-3 times a day. General instructions  Follow any diet instructions given by your health care provider.  Keep all follow-up visits as  told by your health care provider. This is important. Contact a health care provider if you have:  Bleeding from your incision.  Pain, swelling, or redness that does not improve or gets worse.  Trouble passing stool or urine.  Symptoms that return after treatment. Get help right away if you:  Have problems moving or using your legs.  Have severe or increasing pain.  Have swelling in the affected area that suddenly gets worse.  Have a large increase in bleeding or passing of pus.  Develop chills or a fever. Summary  An anorectal abscess is an abscess that is near the opening of the anus or around the rectum. An abscess is an infected area that contains a collection of pus.  The main symptom of this condition is pain. It may be a throbbing pain that gets worse during bowel movements.  Treatment for an anorectal abscess may include surgery to drain the pus from the abscess. Medicines and  sitz baths may also be a part of your treatment plan. This information is not intended to replace advice given to you by your health care provider. Make sure you discuss any questions you have with your health care provider. Document Released: 02/18/2000 Document Revised: 03/29/2017 Document Reviewed: 03/29/2017 Elsevier Patient Education  2020 [employer]

## 2020-06-30 DIAGNOSIS — E119 Type 2 diabetes mellitus without complications: Secondary | ICD-10-CM | POA: Diagnosis not present

## 2020-06-30 DIAGNOSIS — H40033 Anatomical narrow angle, bilateral: Secondary | ICD-10-CM | POA: Diagnosis not present

## 2020-09-08 ENCOUNTER — Encounter: Payer: Self-pay | Admitting: Nurse Practitioner

## 2020-09-08 ENCOUNTER — Ambulatory Visit (INDEPENDENT_AMBULATORY_CARE_PROVIDER_SITE_OTHER): Payer: Medicare HMO | Admitting: Nurse Practitioner

## 2020-09-08 ENCOUNTER — Telehealth: Payer: Self-pay | Admitting: Family

## 2020-09-08 ENCOUNTER — Other Ambulatory Visit: Payer: Self-pay

## 2020-09-08 VITALS — BP 152/87 | HR 106 | Temp 97.6°F | Ht 66.0 in | Wt 177.0 lb

## 2020-09-08 DIAGNOSIS — L0291 Cutaneous abscess, unspecified: Secondary | ICD-10-CM | POA: Diagnosis not present

## 2020-09-08 MED ORDER — CEPHALEXIN 500 MG PO CAPS: 500.0000 mg | ORAL_CAPSULE | Freq: Two times a day (BID) | ORAL | 0 refills | Status: DC

## 2020-09-08 NOTE — Progress Notes (Signed)
Acute Office Visit  Subjective:    Patient ID: Ryan Mcintyre, male    DOB: 10/15/1955, 65 y.o.   MRN: [id number]  Chief Complaint  Patient presents with   Abscess    HPI Patient is in today for  Abscess: Patient presents for evaluation of a cutaneous abscess. Lesion is located in the right buttock. Onset was a few days ago. Symptoms have gradually worsened. Abscess has associated symptoms of none. Patient does not have previous history of cutaneous abscesses. Patient does have diabetes.   Past Medical History:  Diagnosis Date   COPD (chronic obstructive pulmonary disease) (HCC) 02/21/2017   Diabetes mellitus without complication (HCC)    GERD (gastroesophageal reflux disease)    Hyperlipidemia     Past Surgical History:  Procedure Laterality Date   CIRCUMCISION  1974    Family History  Problem Relation Age of Onset   Hypertension Mother    Diabetes Mother    Diabetes Sister    Early death Sister    Early death Brother    Early death Brother     Social History   Socioeconomic History   Marital status: Married    Spouse name: Not on file   Number of children: 3   Years of education: 12   Highest education level: 12th grade  Occupational History   Occupation: Designer, fashion/clothing    Comment: Disabled  Tobacco Use   Smoking status: Former    Packs/day: 0.25    Years: 50.00    Pack years: 12.50    Types: Cigarettes    Quit date: 04/06/2020    Years since quitting: 0.4   Smokeless tobacco: Never   Tobacco comments:    about 3 to 4 cigarettes a day  Vaping Use   Vaping Use: Never used  Substance and Sexual Activity   Alcohol use: No    Alcohol/week: 0.0 standard drinks   Drug use: No   Sexual activity: Yes  Other Topics Concern   Not on file  Social History Narrative   Not on file   Social Determinants of Health   Financial Resource Strain: Not on file  Food Insecurity: Not on file  Transportation Needs: Not on file  Physical Activity: Not on file  Stress:  Not on file  Social Connections: Not on file  Intimate Partner Violence: Not on file    Outpatient Medications Prior to Visit  Medication Sig Dispense Refill   aspirin EC 81 MG tablet Take 1 tablet (81 mg total) by mouth daily.     atorvastatin (LIPITOR) 20 MG tablet Take 1 tablet (20 mg total) by mouth daily. 30 tablet 11   empagliflozin (JARDIANCE) 10 MG TABS tablet Take 10 mg by mouth daily. 90 tablet 1   glimepiride (AMARYL) 4 MG tablet Take 1 tablet (4 mg total) by mouth daily before breakfast. 90 tablet 1   glucose blood test strip Test daily. 100 each 12   metFORMIN (GLUCOPHAGE) 1000 MG tablet Take 1 tablet (1,000 mg total) by mouth 2 (two) times daily with a meal. 180 tablet 3   OMEPRAZOLE PO Take by mouth.     tiotropium (SPIRIVA HANDIHALER) 18 MCG inhalation capsule Place 1 capsule (18 mcg total) into inhaler and inhale daily. 90 capsule 1   doxycycline (VIBRA-TABS) 100 MG tablet Take 1 tablet (100 mg total) by mouth 2 (two) times daily. 20 tablet 0   No facility-administered medications prior to visit.    No Known Allergies  Review of  Systems  Constitutional: Negative.   HENT: Negative.    Respiratory: Negative.    Cardiovascular: Negative.   Gastrointestinal: Negative.   Skin:  Positive for color change.       Abscess  All other systems reviewed and are negative.     Objective:    Physical Exam Vitals and nursing note reviewed.  Constitutional:      Appearance: Normal appearance.  HENT:     Head: Normocephalic.     Right Ear: External ear normal.     Left Ear: External ear normal.     Nose: Nose normal.  Eyes:     Conjunctiva/sclera: Conjunctivae normal.  Cardiovascular:     Rate and Rhythm: Normal rate and regular rhythm.  Pulmonary:     Effort: Pulmonary effort is normal.     Breath sounds: Normal breath sounds.  Abdominal:     General: Bowel sounds are normal.  Skin:    General: Skin is warm.     Findings: Abscess and erythema present.           Comments: Hard abscess found in intergluteal cleft  Neurological:     Mental Status: He is alert and oriented to person, place, and time.  Psychiatric:        Behavior: Behavior normal.    BP (!) 152/87   Pulse (!) 106   Temp 97.6 F (36.4 C) (Temporal)   Ht 5\' 6"  (1.676 m)   Wt 177 lb (80.3 kg)   SpO2 98%   BMI 28.57 kg/m  Wt Readings from Last 3 Encounters:  09/08/20 177 lb (80.3 kg)  01/09/19 172 lb (78 kg)  12/10/18 175 lb 3.2 oz (79.5 kg)    Health Maintenance Due  Topic Date Due   COVID-19 Vaccine (1) Never done   Zoster Vaccines- Shingrix (1 of 2) Never done   OPHTHALMOLOGY EXAM  05/24/2017   HEMOGLOBIN A1C  12/07/2017   PNA vac Low Risk Adult (2 of 2 - PPSV23) 05/15/2018   FOOT EXAM  06/08/2018   URINE MICROALBUMIN  06/08/2018    There are no preventive care reminders to display for this patient.   Lab Results  Component Value Date   TSH 1.340 09/28/2014   No results found for: WBC, HGB, HCT, MCV, PLT Lab Results  Component Value Date   NA 142 06/07/2017   K 4.2 06/07/2017   CO2 24 06/07/2017   GLUCOSE 217 (H) 06/07/2017   BUN 19 06/07/2017   CREATININE 1.04 06/07/2017   BILITOT 0.3 06/07/2017   ALKPHOS 78 06/07/2017   AST 16 06/07/2017   ALT 26 06/07/2017   PROT 7.0 06/07/2017   ALBUMIN 4.3 06/07/2017   CALCIUM 9.7 06/07/2017   Lab Results  Component Value Date   CHOL 165 02/21/2017   Lab Results  Component Value Date   HDL 30 (L) 02/21/2017   Lab Results  Component Value Date   LDLCALC 65 02/21/2017   Lab Results  Component Value Date   TRIG 348 (H) 02/21/2017   Lab Results  Component Value Date   CHOLHDL 5.5 (H) 02/21/2017   Lab Results  Component Value Date   HGBA1C 8.7 (H) 06/07/2017       Assessment & Plan:   Problem List Items Addressed This Visit       Other   Abscess - Primary    Abscess hard to touch, red and tender.  Started patient on Keflex 500 mg tablet twice daily for 10 days.  Advised patient to return  in 4 to 5 days.  Plan is to I&D and pack appropriately.  Use Tylenol or ibuprofen for pain, warm compress.  Patient verbalized understanding printed handouts given. Rx sent to pharmacy       Relevant Medications   cephALEXin (KEFLEX) 500 MG capsule     Meds ordered this encounter  Medications   cephALEXin (KEFLEX) 500 MG capsule    Sig: Take 1 capsule (500 mg total) by mouth 2 (two) times daily.    Dispense:  20 capsule    Refill:  0    Order Specific Question:   Supervising Provider    Answer:   Raliegh Ip [[id number]]     Daryll Drown, NP

## 2020-09-08 NOTE — Assessment & Plan Note (Signed)
Abscess hard to touch, red and tender.  Started patient on Keflex 500 mg tablet twice daily for 10 days.  Advised patient to return in 4 to 5 days.  Plan is to I&D and pack appropriately.  Use Tylenol or ibuprofen for pain, warm compress.  Patient verbalized understanding printed handouts given. Rx sent to pharmacy

## 2020-09-08 NOTE — Telephone Encounter (Signed)
Pt was supposed to have antibiotics sent in. Called to make sure something was getting called in for him.

## 2020-09-08 NOTE — Patient Instructions (Signed)
Skin Abscess  A skin abscess is an infected area on or under your skin that contains a collection of pus and other material. An abscess may also be called a furuncle,carbuncle, or boil. An abscess can occur in or on almost any part of your body. Some abscesses break open (rupture) on their own. Most continue to get worse unless they are treated. The infection can spread deeper into the body and eventually into your blood, whichcan make you feel ill. Treatment usually involves draining the abscess. What are the causes? An abscess occurs when germs, like bacteria, pass through your skin and cause an infection. This may be caused by: A scrape or cut on your skin. A puncture wound through your skin, including a needle injection or insect bite. Blocked oil or sweat glands. Blocked and infected hair follicles. A cyst that forms beneath your skin (sebaceous cyst) and becomes infected. What increases the risk? This condition is more likely to develop in people who: Have a weak body defense system (immune system). Have diabetes. Have dry and irritated skin. Get frequent injections or use illegal IV drugs. Have a foreign body in a wound, such as a splinter. Have problems with their lymph system or veins. What are the signs or symptoms? Symptoms of this condition include: A painful, firm bump under the skin. A bump with pus at the top. This may break through the skin and drain. Other symptoms include: Redness surrounding the abscess site. Warmth. Swelling of the lymph nodes (glands) near the abscess. Tenderness. A sore on the skin. How is this diagnosed? This condition may be diagnosed based on: A physical exam. Your medical history. A sample of pus. This may be used to find out what is causing the infection. Blood tests. Imaging tests, such as an ultrasound, CT scan, or MRI. How is this treated? A small abscess that drains on its own may not need treatment. Treatment for larger abscesses  may include: Moist heat or heat pack applied to the area several times a day. A procedure to drain the abscess (incision and drainage). Antibiotic medicines. For a severe abscess, you may first get antibiotics through an IV and then change to antibiotics by mouth. Follow these instructions at home: Medicines  Take over-the-counter and prescription medicines only as told by your health care provider. If you were prescribed an antibiotic medicine, take it as told by your health care provider. Do not stop taking the antibiotic even if you start to feel better.  Abscess care  If you have an abscess that has not drained, apply heat to the affected area. Use the heat source that your health care provider recommends, such as a moist heat pack or a heating pad. Place a towel between your skin and the heat source. Leave the heat on for 20-30 minutes. Remove the heat if your skin turns bright red. This is especially important if you are unable to feel pain, heat, or cold. You may have a greater risk of getting burned. Follow instructions from your health care provider about how to take care of your abscess. Make sure you: Cover the abscess with a bandage (dressing). Change your dressing or gauze as told by your health care provider. Wash your hands with soap and water before you change the dressing or gauze. If soap and water are not available, use hand sanitizer. Check your abscess every day for signs of a worsening infection. Check for: More redness, swelling, or pain. More fluid or blood. Warmth. More   pus or a bad smell.  General instructions To avoid spreading the infection: Do not share personal care items, towels, or hot tubs with others. Avoid making skin contact with other people. Keep all follow-up visits as told by your health care provider. This is important. Contact a health care provider if you have: More redness, swelling, or pain around your abscess. More fluid or blood coming  from your abscess. Warm skin around your abscess. More pus or a bad smell coming from your abscess. A fever. Muscle aches. Chills or a general ill feeling. Get help right away if you: Have severe pain. See red streaks on your skin spreading away from the abscess. Summary A skin abscess is an infected area on or under your skin that contains a collection of pus and other material. A small abscess that drains on its own may not need treatment. Treatment for larger abscesses may include having a procedure to drain the abscess and taking an antibiotic. This information is not intended to replace advice given to you by your health care provider. Make sure you discuss any questions you have with your healthcare provider. Document Revised: 06/13/2018 Document Reviewed: 04/05/2017 Elsevier Patient Education  2022 [employer] Inc[employer]  

## 2020-09-16 ENCOUNTER — Other Ambulatory Visit: Payer: Self-pay

## 2020-09-16 ENCOUNTER — Encounter: Payer: Self-pay | Admitting: Nurse Practitioner

## 2020-09-16 ENCOUNTER — Ambulatory Visit (INDEPENDENT_AMBULATORY_CARE_PROVIDER_SITE_OTHER): Payer: Medicare HMO | Admitting: Nurse Practitioner

## 2020-09-16 VITALS — BP 135/80 | HR 88 | Temp 97.3°F | Ht 66.0 in | Wt 176.0 lb

## 2020-09-16 DIAGNOSIS — L0291 Cutaneous abscess, unspecified: Secondary | ICD-10-CM | POA: Diagnosis not present

## 2020-09-16 NOTE — Progress Notes (Signed)
Acute Office Visit  Subjective:    Patient ID: Ryan Mcintyre, male    DOB: 14-Sep-1955, 65 y.o.   MRN: [id number]  Chief Complaint  Patient presents with   Abscess    Abscess  Patient is in today for follow up abscess, abscess is resolved, no puss or hard filled area, patient has a few more days of his antibiotic to complete and plans to finish taking medication as prescribed.  Past Medical History:  Diagnosis Date   COPD (chronic obstructive pulmonary disease) (HCC) 02/21/2017   Diabetes mellitus without complication (HCC)    GERD (gastroesophageal reflux disease)    Hyperlipidemia     Past Surgical History:  Procedure Laterality Date   CIRCUMCISION  1974    Family History  Problem Relation Age of Onset   Hypertension Mother    Diabetes Mother    Diabetes Sister    Early death Sister    Early death Brother    Early death Brother     Social History   Socioeconomic History   Marital status: Married    Spouse name: Not on file   Number of children: 3   Years of education: 12   Highest education level: 12th grade  Occupational History   Occupation: Designer, fashion/clothing    Comment: Disabled  Tobacco Use   Smoking status: Former    Packs/day: 0.25    Years: 50.00    Pack years: 12.50    Types: Cigarettes    Quit date: 04/06/2020    Years since quitting: 0.4   Smokeless tobacco: Never   Tobacco comments:    about 3 to 4 cigarettes a day  Vaping Use   Vaping Use: Never used  Substance and Sexual Activity   Alcohol use: No    Alcohol/week: 0.0 standard drinks   Drug use: No   Sexual activity: Yes  Other Topics Concern   Not on file  Social History Narrative   Not on file   Social Determinants of Health   Financial Resource Strain: Not on file  Food Insecurity: Not on file  Transportation Needs: Not on file  Physical Activity: Not on file  Stress: Not on file  Social Connections: Not on file  Intimate Partner Violence: Not on file    Outpatient  Medications Prior to Visit  Medication Sig Dispense Refill   aspirin EC 81 MG tablet Take 1 tablet (81 mg total) by mouth daily.     cephALEXin (KEFLEX) 500 MG capsule Take 1 capsule (500 mg total) by mouth 2 (two) times daily. 20 capsule 0   empagliflozin (JARDIANCE) 10 MG TABS tablet Take 10 mg by mouth daily. 90 tablet 1   glimepiride (AMARYL) 4 MG tablet Take 1 tablet (4 mg total) by mouth daily before breakfast. 90 tablet 1   glucose blood test strip Test daily. 100 each 12   metFORMIN (GLUCOPHAGE) 1000 MG tablet Take 1 tablet (1,000 mg total) by mouth 2 (two) times daily with a meal. 180 tablet 3   OMEPRAZOLE PO Take by mouth.     tiotropium (SPIRIVA HANDIHALER) 18 MCG inhalation capsule Place 1 capsule (18 mcg total) into inhaler and inhale daily. 90 capsule 1   atorvastatin (LIPITOR) 20 MG tablet Take 1 tablet (20 mg total) by mouth daily. 30 tablet 11   No facility-administered medications prior to visit.    No Known Allergies  Review of Systems  Constitutional: Negative.   HENT: Negative.    Eyes: Negative.  Respiratory: Negative.    Cardiovascular: Negative.   Gastrointestinal: Negative.   All other systems reviewed and are negative.     Objective:    Physical Exam Vitals and nursing note reviewed.  Constitutional:      Appearance: Normal appearance.  HENT:     Head: Normocephalic.     Nose: Nose normal.     Mouth/Throat:     Mouth: Mucous membranes are moist.     Pharynx: Oropharynx is clear.  Eyes:     Conjunctiva/sclera: Conjunctivae normal.  Cardiovascular:     Rate and Rhythm: Normal rate and regular rhythm.     Pulses: Normal pulses.     Heart sounds: Normal heart sounds.  Pulmonary:     Effort: Pulmonary effort is normal.     Breath sounds: Normal breath sounds.  Abdominal:     General: Bowel sounds are normal.  Skin:    Findings: No rash.  Neurological:     Mental Status: He is alert and oriented to person, place, and time.    BP 135/80    Pulse 88   Temp (!) 97.3 F (36.3 C) (Temporal)   Ht 5\' 6"  (1.676 m)   Wt 176 lb (79.8 kg)   SpO2 96%   BMI 28.41 kg/m  Wt Readings from Last 3 Encounters:  09/16/20 176 lb (79.8 kg)  09/08/20 177 lb (80.3 kg)  01/09/19 172 lb (78 kg)    Health Maintenance Due  Topic Date Due   COVID-19 Vaccine (1) Never done   Zoster Vaccines- Shingrix (1 of 2) Never done   OPHTHALMOLOGY EXAM  05/24/2017   HEMOGLOBIN A1C  12/07/2017   PNA vac Low Risk Adult (2 of 2 - PPSV23) 05/15/2018   FOOT EXAM  06/08/2018   URINE MICROALBUMIN  06/08/2018    There are no preventive care reminders to display for this patient.   Lab Results  Component Value Date   TSH 1.340 09/28/2014   No results found for: WBC, HGB, HCT, MCV, PLT Lab Results  Component Value Date   NA 142 06/07/2017   K 4.2 06/07/2017   CO2 24 06/07/2017   GLUCOSE 217 (H) 06/07/2017   BUN 19 06/07/2017   CREATININE 1.04 06/07/2017   BILITOT 0.3 06/07/2017   ALKPHOS 78 06/07/2017   AST 16 06/07/2017   ALT 26 06/07/2017   PROT 7.0 06/07/2017   ALBUMIN 4.3 06/07/2017   CALCIUM 9.7 06/07/2017   Lab Results  Component Value Date   CHOL 165 02/21/2017   Lab Results  Component Value Date   HDL 30 (L) 02/21/2017   Lab Results  Component Value Date   LDLCALC 65 02/21/2017   Lab Results  Component Value Date   TRIG 348 (H) 02/21/2017   Lab Results  Component Value Date   CHOLHDL 5.5 (H) 02/21/2017   Lab Results  Component Value Date   HGBA1C 8.7 (H) 06/07/2017       Assessment & Plan:   Problem List Items Addressed This Visit       Other   Abscess - Primary    Abscess resolved, follow up with recurrence of abscess         No orders of the defined types were placed in this encounter.    08/07/2017, NP

## 2020-09-16 NOTE — Assessment & Plan Note (Deleted)
Abscess resolved, follow up with recurrence of abscess 

## 2020-09-16 NOTE — Patient Instructions (Signed)
Skin Abscess ?A skin abscess is an infected area of your skin that contains pus and other material. An abscess can happen in any part of your body. Some abscesses break open (rupture) on their own. Most continue to get worse unless they are treated. The infection can spread deeper into the body and into your blood, which can make you feel sick. ?A skin abscess is caused by germs that enter the skin through a cut or scrape. It can also be caused by blocked oil and sweat glands or infected hair follicles. ?This condition is usually treated by: ?Draining the pus. ?Taking antibiotic medicines. ?Placing a warm, wet washcloth over the abscess. ?Follow these instructions at home: ?Medicines ? ?Take over-the-counter and prescription medicines only as told by your doctor. ?If you were prescribed an antibiotic medicine, take it as told by your doctor. Do not stop taking the antibiotic even if you start to feel better. ?Abscess care ? ?If you have an abscess that has not drained, place a warm, clean, wet washcloth over the abscess several times a day. Do this as told by your doctor. ?Follow instructions from your doctor about how to take care of your abscess. Make sure you: ?Cover the abscess with a bandage (dressing). ?Change your bandage or gauze as told by your doctor. ?Wash your hands with soap and water before you change the bandage or gauze. If you cannot use soap and water, use hand sanitizer. ?Check your abscess every day for signs that the infection is getting worse. Check for: ?More redness, swelling, or pain. ?More fluid or blood. ?Warmth. ?More pus or a bad smell. ?General instructions ?To avoid spreading the infection: ?Do not share personal care items, towels, or hot tubs with others. ?Avoid making skin-to-skin contact with other people. ?Keep all follow-up visits as told by your doctor. This is important. ?Contact a doctor if: ?You have more redness, swelling, or pain around your abscess. ?You have more fluid or  blood coming from your abscess. ?Your abscess feels warm when you touch it. ?You have more pus or a bad smell coming from your abscess. ?You have a fever. ?Your muscles ache. ?You have chills. ?You feel sick. ?Get help right away if: ?You have very bad (severe) pain. ?You see red streaks on your skin spreading away from the abscess. ?Summary ?A skin abscess is an infected area of your skin that contains pus and other material. ?The abscess is caused by germs that enter the skin through a cut or scrape. It can also be caused by blocked oil and sweat glands or infected hair follicles. ?Follow your doctor's instructions on caring for your abscess, taking medicines, preventing infections, and keeping follow-up visits. ?This information is not intended to replace advice given to you by your health care provider. Make sure you discuss any questions you have with your health care provider. ?Document Revised: 09/26/2018 Document Reviewed: 04/05/2017 ?Elsevier Patient Education ? 2022 Elsevier Inc[employer] ? ?

## 2020-09-16 NOTE — Assessment & Plan Note (Signed)
Abscess resolved, follow up with recurrence of abscess

## 2020-09-21 ENCOUNTER — Ambulatory Visit (INDEPENDENT_AMBULATORY_CARE_PROVIDER_SITE_OTHER): Payer: Medicare HMO

## 2020-09-21 DIAGNOSIS — Z0000 Encounter for general adult medical examination without abnormal findings: Secondary | ICD-10-CM | POA: Diagnosis not present

## 2020-09-21 NOTE — Progress Notes (Signed)
MEDICARE ANNUAL WELLNESS VISIT  09/21/2020  Telephone Visit Disclaimer This Medicare AWV was conducted by telephone due to national recommendations for restrictions regarding the COVID-19 Pandemic (e.g. social distancing).  I verified, using two identifiers, that I am speaking with Ryan Mcintyre or their authorized healthcare agent. I discussed the limitations, risks, security, and privacy concerns of performing an evaluation and management service by telephone and the potential availability of an in-person appointment in the future. The patient expressed understanding and agreed to proceed.  Location of Patient: Home Location of Provider (nurse):  [facility]  Subjective:    Ryan Mcintyre is a 65 y.o. male patient of Hawks, Edilia Bo, FNP who had a Medicare Annual Wellness Visit today via telephone. Gedeon is Retired and lives with their spouse. He has one stepchild and three children of his own. He reports that he is socially active and does interact with friends/family regularly. He is moderately physically active and enjoys spending time outdoors, riding his bike and hiking.  Patient Care Team: Junie Spencer, FNP as PCP - General (Nurse Practitioner) Derryl Harbor, OD as Consulting Physician (Optometry) Mikal Plane, MD as Consulting Physician  Advanced Directives 09/21/2020 05/23/2017 05/23/2016  Does Patient Have a Medical Advance Directive? No No No  Would patient like information on creating a medical advance directive? No - Patient declined No - Patient declined Yes (MAU/Ambulatory/Procedural Areas - Information given)    Hospital Utilization Over the Past 12 Months: # of hospitalizations or ER visits: 0 # of surgeries: 0  Review of Systems    Patient reports that his overall health is unchanged compared to last year.  History obtained from chart review and the patient  Patient Reported Readings (BP, Pulse, CBG, Weight, etc) none  Pain Assessment Pain : No/denies  pain     Current Medications & Allergies (verified) Allergies as of 09/21/2020   No Known Allergies      Medication List        Accurate as of September 21, 2020  1:40 PM. If you have any questions, ask your nurse or doctor.          STOP taking these medications    cephALEXin 500 MG capsule Commonly known as: Keflex       TAKE these medications    aspirin EC 81 MG tablet Take 1 tablet (81 mg total) by mouth daily.   atorvastatin 20 MG tablet Commonly known as: Lipitor Take 1 tablet (20 mg total) by mouth daily.   empagliflozin 10 MG Tabs tablet Commonly known as: Jardiance Take 10 mg by mouth daily.   glimepiride 4 MG tablet Commonly known as: AMARYL Take 1 tablet (4 mg total) by mouth daily before breakfast.   glucose blood test strip Test daily.   metFORMIN 1000 MG tablet Commonly known as: GLUCOPHAGE Take 1 tablet (1,000 mg total) by mouth 2 (two) times daily with a meal.   OMEPRAZOLE PO Take by mouth.   tiotropium 18 MCG inhalation capsule Commonly known as: Spiriva HandiHaler Place 1 capsule (18 mcg total) into inhaler and inhale daily.        History (reviewed): Past Medical History:  Diagnosis Date   COPD (chronic obstructive pulmonary disease) (HCC) 02/21/2017   Diabetes mellitus without complication (HCC)    GERD (gastroesophageal reflux disease)    Hyperlipidemia    Past Surgical History:  Procedure Laterality Date   CIRCUMCISION  1974   Family History  Problem Relation Age of Onset  Hypertension Mother    Diabetes Mother    Diabetes Sister    Early death Sister    Early death Brother    Early death Brother    Social History   Socioeconomic History   Marital status: Married    Spouse name: Not on file   Number of children: 3   Years of education: 12   Highest education level: 12th grade  Occupational History   Occupation: Designer, fashion/clothingtextiles    Comment: Disabled  Tobacco Use   Smoking status: Former    Packs/day: 0.25     Years: 50.00    Pack years: 12.50    Types: Cigarettes    Quit date: 04/06/2020    Years since quitting: 0.4   Smokeless tobacco: Never   Tobacco comments:    about 3 to 4 cigarettes a day  Vaping Use   Vaping Use: Never used  Substance and Sexual Activity   Alcohol use: No    Alcohol/week: 0.0 standard drinks   Drug use: No   Sexual activity: Yes  Other Topics Concern   Not on file  Social History Narrative   Not on file   Social Determinants of Health   Financial Resource Strain: Not on file  Food Insecurity: Not on file  Transportation Needs: Not on file  Physical Activity: Not on file  Stress: Not on file  Social Connections: Not on file    Activities of Daily Living In your present state of health, do you have any difficulty performing the following activities: 09/21/2020  Hearing? N  Vision? N  Difficulty concentrating or making decisions? N  Walking or climbing stairs? N  Dressing or bathing? N  Doing errands, shopping? N  Preparing Food and eating ? N  Using the Toilet? N  In the past six months, have you accidently leaked urine? N  Do you have problems with loss of bowel control? N  Managing your Medications? N  Managing your Finances? N  Housekeeping or managing your Housekeeping? N  Some recent data might be hidden    Patient Education/ Literacy How often do you need to have someone help you when you read instructions, pamphlets, or other written materials from your doctor or pharmacy?: 1 - Never What is the last grade level you completed in school?: 9th grade  Exercise Current Exercise Habits: Home exercise routine, Type of exercise: Other - see comments (bicycle), Time (Minutes): 30, Frequency (Times/Week): 5, Weekly Exercise (Minutes/Week): 150, Intensity: Moderate, Exercise limited by: None identified  Diet Patient reports consuming 3 meals a day and 1 snack(s) a day Patient reports that his primary diet is: Regular Patient reports that he does  have regular access to food.   Depression Screen PHQ 2/9 Scores 09/16/2020 09/08/2020 12/10/2018 12/06/2018 06/07/2017 05/23/2017 02/21/2017  PHQ - 2 Score 0 0 0 0 0 0 0  PHQ- 9 Score - - - - - - -     Fall Risk Fall Risk  09/21/2020 09/16/2020 09/08/2020 12/10/2018 12/06/2018  Falls in the past year? 0 0 0 0 0  Follow up Falls evaluation completed - - - -     Objective:  Ryan MessickHarden J Banfill seemed alert and oriented and he participated appropriately during our telephone visit.  Blood Pressure Weight BMI  BP Readings from Last 3 Encounters:  09/16/20 135/80  09/08/20 (!) 152/87  01/09/19 (!) 143/91   Wt Readings from Last 3 Encounters:  09/16/20 176 lb (79.8 kg)  09/08/20 177 lb (80.3 kg)  01/09/19 172 lb (78 kg)   BMI Readings from Last 1 Encounters:  09/16/20 28.41 kg/m    *Unable to obtain current vital signs, weight, and BMI due to telephone visit type  Hearing/Vision  Anthonette Legato did not seem to have difficulty with hearing/understanding during the telephone conversation Reports that he has had a formal eye exam by an eye care professional within the past year Reports that he has not had a formal hearing evaluation within the past year *Unable to fully assess hearing and vision during telephone visit type  Cognitive Function: 6CIT Screen 09/21/2020  What Year? 0 points  What month? 0 points  What time? 0 points  Count back from 20 0 points  Months in reverse 0 points  Repeat phrase 0 points  Total Score 0   (Normal:0-7, Significant for Dysfunction: >8)  Normal Cognitive Function Screening: Yes   Immunization & Health Maintenance Record Immunization History  Administered Date(s) Administered   Fluad Quad(high Dose 65+) 12/06/2018   Influenza Inj Mdck Quad Pf 12/22/2016   Influenza,inj,Quad PF,6+ Mos 12/30/2014, 01/18/2018   Influenza-Unspecified 12/22/2016   Pneumococcal Conjugate-13 09/28/2014   Tdap 09/28/2014    Health Maintenance  Topic Date Due   COVID-19 Vaccine (1)  Never done   Zoster Vaccines- Shingrix (1 of 2) Never done   OPHTHALMOLOGY EXAM  05/24/2017   HEMOGLOBIN A1C  12/07/2017   PNA vac Low Risk Adult (2 of 2 - PPSV23) 05/15/2018   FOOT EXAM  06/08/2018   URINE MICROALBUMIN  06/08/2018   INFLUENZA VACCINE  10/04/2020   COLONOSCOPY (Pts 45-105yrs Insurance coverage will need to be confirmed)  03/11/2023   TETANUS/TDAP  09/27/2024   Hepatitis C Screening  Completed   HPV VACCINES  Aged Out       Assessment  This is a routine wellness examination for [employer].  Health Maintenance: Due or Overdue Health Maintenance Due  Topic Date Due   COVID-19 Vaccine (1) Never done   Zoster Vaccines- Shingrix (1 of 2) Never done   OPHTHALMOLOGY EXAM  05/24/2017   HEMOGLOBIN A1C  12/07/2017   PNA vac Low Risk Adult (2 of 2 - PPSV23) 05/15/2018   FOOT EXAM  06/08/2018   URINE MICROALBUMIN  06/08/2018    Ryan Mcintyre does not need a referral for [employer] Assistance: Care Management:   no Social Work:    no Prescription Assistance:  no Nutrition/Diabetes Education:  no   Plan:  Personalized Goals  Goals Addressed             This Visit's Progress    Patient Stated       09/21/2020 AWV Goal: Fall Prevention  Over the next year, patient will decrease their risk for falls by: Using assistive devices, such as a cane or walker, as needed Identifying fall risks within their home and correcting them by: Removing throw rugs Adding handrails to stairs or ramps Removing clutter and keeping a clear pathway throughout the home Increasing light, especially at night Adding shower handles/bars Raising toilet seat Identifying potential personal risk factors for falls: Medication side effects Incontinence/urgency Vestibular dysfunction Hearing loss Musculoskeletal disorders Neurological disorders Orthostatic hypotension         Personalized Health Maintenance & Screening Recommendations  Pneumococcal vaccine   Lung Cancer  Screening Recommended: no (Low Dose CT Chest recommended if Age 63-80 years, 30 pack-year currently smoking OR have quit w/in past 15 years) Hepatitis C Screening recommended: no HIV Screening recommended: no  Advanced Directives:  Written information was not prepared per patient's request.  Referrals & Orders No orders of the defined types were placed in this encounter.   Follow-up Plan Follow-up with Junie Spencer, FNP as planned    I have personally reviewed and noted the following in the patient's chart:   Medical and social history Use of alcohol, tobacco or illicit drugs  Current medications and supplements Functional ability and status Nutritional status Physical activity Advanced directives List of other physicians Hospitalizations, surgeries, and ER visits in previous 12 months Vitals Screenings to include cognitive, depression, and falls Referrals and appointments  In addition, I have reviewed and discussed with Ryan Mcintyre certain preventive protocols, quality metrics, and best practice recommendations. A written personalized care plan for preventive services as well as general preventive health recommendations is available and can be mailed to the patient at his request.      Mariam Dollar, LPN    03/21/5788

## 2020-11-29 ENCOUNTER — Other Ambulatory Visit: Payer: Self-pay

## 2020-11-29 ENCOUNTER — Ambulatory Visit (INDEPENDENT_AMBULATORY_CARE_PROVIDER_SITE_OTHER): Payer: Medicare HMO | Admitting: Nurse Practitioner

## 2020-11-29 ENCOUNTER — Encounter: Payer: Self-pay | Admitting: Nurse Practitioner

## 2020-11-29 DIAGNOSIS — R059 Cough, unspecified: Secondary | ICD-10-CM | POA: Diagnosis not present

## 2020-11-29 MED ORDER — BENZONATATE 100 MG PO CAPS: 100.0000 mg | ORAL_CAPSULE | Freq: Three times a day (TID) | ORAL | 0 refills | Status: DC | PRN

## 2020-11-29 NOTE — Assessment & Plan Note (Signed)
Take meds as prescribed -Benzonatate 100 mg tablet by mouth. - Use a cool mist humidifier  -Use saline nose sprays frequently -Force fluids -For fever or aches or pains- take Tylenol or ibuprofen. -If symptoms do not improve, she may need to be COVID tested to rule this out Follow up with worsening unresolved symptoms

## 2020-11-29 NOTE — Progress Notes (Signed)
   Virtual Visit  Note Due to COVID-19 pandemic this visit was conducted virtually. This visit type was conducted due to national recommendations for restrictions regarding the COVID-19 Pandemic (e.g. social distancing, sheltering in place) in an effort to limit this patient's exposure and mitigate transmission in our community. All issues noted in this document were discussed and addressed.  A physical exam was not performed with this format.  I connected with Ryan Mcintyre on 11/29/20 at 10:35 AM by telephone and verified that I am speaking with the correct person using two identifiers. Ryan Mcintyre is currently located at home during visit. The provider, Daryll Drown, NP is located in their office at time of visit.  I discussed the limitations, risks, security and privacy concerns of performing an evaluation and management service by telephone and the availability of in person appointments. I also discussed with the patient that there may be a patient responsible charge related to this service. The patient expressed understanding and agreed to proceed.   History and Present Illness:  Cough This is a new problem. Episode onset: The past 3 days. The problem has been gradually worsening. The problem occurs constantly. The cough is Non-productive. Pertinent negatives include no chest pain, chills, ear congestion, ear pain, fever, headaches, heartburn, hemoptysis or nasal congestion. Nothing aggravates the symptoms. He has tried nothing for the symptoms.     Review of Systems  Constitutional:  Negative for chills and fever.  HENT:  Negative for ear pain.   Respiratory:  Positive for cough. Negative for hemoptysis.   Cardiovascular:  Negative for chest pain.  Gastrointestinal:  Negative for heartburn.  Neurological:  Negative for headaches.    Observations/Objective: Televisit patient not in distress  Assessment and Plan: Take meds as prescribed -Benzonatate 100 mg tablet by  mouth. - Use a cool mist humidifier  -Use saline nose sprays frequently -Force fluids -For fever or aches or pains- take Tylenol or ibuprofen. -If symptoms do not improve, she may need to be COVID tested to rule this out Follow up with worsening unresolved symptoms   Follow Up Instructions: Follow-up with unresolved symptoms    I discussed the assessment and treatment plan with the patient. The patient was provided an opportunity to ask questions and all were answered. The patient agreed with the plan and demonstrated an understanding of the instructions.   The patient was advised to call back or seek an in-person evaluation if the symptoms worsen or if the condition fails to improve as anticipated.  The above assessment and management plan was discussed with the patient. The patient verbalized understanding of and has agreed to the management plan. Patient is aware to call the clinic if symptoms persist or worsen. Patient is aware when to return to the clinic for a follow-up visit. Patient educated on when it is appropriate to go to the emergency department.   Time call ended: 10:45 AM  I provided 10 minutes of  non face-to-face time during this encounter.    Daryll Drown, NP

## 2020-12-02 DIAGNOSIS — Z20822 Contact with and (suspected) exposure to covid-19: Secondary | ICD-10-CM | POA: Diagnosis not present

## 2020-12-06 DIAGNOSIS — R000 Tachycardia, unspecified: Secondary | ICD-10-CM | POA: Diagnosis not present

## 2020-12-06 DIAGNOSIS — R531 Weakness: Secondary | ICD-10-CM | POA: Diagnosis not present

## 2020-12-06 DIAGNOSIS — E860 Dehydration: Secondary | ICD-10-CM | POA: Diagnosis not present

## 2020-12-06 DIAGNOSIS — R059 Cough, unspecified: Secondary | ICD-10-CM | POA: Diagnosis not present

## 2020-12-06 DIAGNOSIS — J329 Chronic sinusitis, unspecified: Secondary | ICD-10-CM | POA: Diagnosis not present

## 2020-12-06 DIAGNOSIS — R062 Wheezing: Secondary | ICD-10-CM | POA: Diagnosis not present

## 2020-12-08 DIAGNOSIS — R0602 Shortness of breath: Secondary | ICD-10-CM | POA: Diagnosis not present

## 2020-12-08 DIAGNOSIS — J4541 Moderate persistent asthma with (acute) exacerbation: Secondary | ICD-10-CM | POA: Diagnosis not present

## 2020-12-08 DIAGNOSIS — R079 Chest pain, unspecified: Secondary | ICD-10-CM | POA: Diagnosis not present

## 2020-12-08 DIAGNOSIS — Z7984 Long term (current) use of oral hypoglycemic drugs: Secondary | ICD-10-CM | POA: Diagnosis not present

## 2020-12-08 DIAGNOSIS — F17200 Nicotine dependence, unspecified, uncomplicated: Secondary | ICD-10-CM | POA: Diagnosis not present

## 2020-12-08 DIAGNOSIS — Z79899 Other long term (current) drug therapy: Secondary | ICD-10-CM | POA: Diagnosis not present

## 2020-12-08 DIAGNOSIS — E119 Type 2 diabetes mellitus without complications: Secondary | ICD-10-CM | POA: Diagnosis not present

## 2021-09-23 ENCOUNTER — Telehealth: Payer: Self-pay | Admitting: Family

## 2021-09-23 NOTE — Telephone Encounter (Signed)
Left message for patient to call back and schedule Medicare Annual Wellness Visit (AWV) to be completed by video or phone.   Last AWV: 09/21/2020  Please schedule at anytime with [facility] Nurse Health Advisor     45 minute appointment  Any questions, please contact me at [phone]      

## 2021-09-28 ENCOUNTER — Telehealth: Payer: Self-pay

## 2021-09-28 ENCOUNTER — Ambulatory Visit (INDEPENDENT_AMBULATORY_CARE_PROVIDER_SITE_OTHER): Payer: No Typology Code available for payment source

## 2021-09-28 VITALS — Wt 176.0 lb

## 2021-09-28 DIAGNOSIS — Z0000 Encounter for general adult medical examination without abnormal findings: Secondary | ICD-10-CM | POA: Diagnosis not present

## 2021-09-28 NOTE — Telephone Encounter (Signed)
He lost his glucometer - can we send in a new rx? Checks once daily fasting

## 2021-09-28 NOTE — Progress Notes (Signed)
Subjective:   Ryan Mcintyre is a 66 y.o. male who presents for Medicare Annual/Subsequent preventive examination.  Virtual Visit via Telephone Note  I connected with  Ryan Mcintyre on 09/28/21 at 11:15 AM EDT by telephone and verified that I am speaking with the correct person using two identifiers.  Location: Patient: Home Provider: [facility] Persons participating in the virtual visit: patient/Nurse Health Advisor   I discussed the limitations, risks, security and privacy concerns of performing an evaluation and management service by telephone and the availability of in person appointments. The patient expressed understanding and agreed to proceed.  Interactive audio and video telecommunications were attempted between this nurse and patient, however failed, due to patient having technical difficulties OR patient did not have access to video capability.  We continued and completed visit with audio only.  Some vital signs may be absent or patient reported.   Trinisha Paget E Latoshia Monrroy, LPN   Review of Systems     Cardiac Risk Factors include: advanced age (>59men, >11 women);diabetes mellitus;dyslipidemia;male gender;smoking/ tobacco exposure;Other (see comment), Risk factor comments: COPD, hepatic steatosis     Objective:    Today's Vitals   09/28/21 1113  Weight: 176 lb (79.8 kg)   Body mass index is 28.41 kg/m.     09/28/2021   11:19 AM 09/21/2020    1:37 PM 05/23/2017    1:27 PM 05/23/2016    3:11 PM  Advanced Directives  Does Patient Have a Medical Advance Directive? No No No No  Would patient like information on creating a medical advance directive? No - Patient declined No - Patient declined No - Patient declined Yes (MAU/Ambulatory/Procedural Areas - Information given)    Current Medications (verified) Outpatient Encounter Medications as of 09/28/2021  Medication Sig   glimepiride (AMARYL) 4 MG tablet Take 1 tablet (4 mg total) by mouth daily before breakfast.   metFORMIN  (GLUCOPHAGE) 1000 MG tablet Take 1 tablet (1,000 mg total) by mouth 2 (two) times daily with a meal.   OMEPRAZOLE PO Take by mouth.   glucose blood test strip Test daily. (Patient not taking: Reported on 09/28/2021)   [DISCONTINUED] albuterol (VENTOLIN HFA) 108 (90 Base) MCG/ACT inhaler Inhale into the lungs.   [DISCONTINUED] aspirin EC 81 MG tablet Take 1 tablet (81 mg total) by mouth daily. (Patient not taking: Reported on 09/28/2021)   [DISCONTINUED] atorvastatin (LIPITOR) 20 MG tablet Take 1 tablet (20 mg total) by mouth daily.   [DISCONTINUED] benzonatate (TESSALON PERLES) 100 MG capsule Take 1 capsule (100 mg total) by mouth 3 (three) times daily as needed for cough. (Patient not taking: Reported on 09/28/2021)   [DISCONTINUED] empagliflozin (JARDIANCE) 10 MG TABS tablet Take 10 mg by mouth daily. (Patient not taking: Reported on 09/28/2021)   [DISCONTINUED] HYDROcodone bit-homatropine (HYCODAN) 5-1.5 MG/5ML syrup Take by mouth. (Patient not taking: Reported on 09/28/2021)   [DISCONTINUED] tiotropium (SPIRIVA HANDIHALER) 18 MCG inhalation capsule Place 1 capsule (18 mcg total) into inhaler and inhale daily. (Patient not taking: Reported on 09/21/2020)   No facility-administered encounter medications on file as of 09/28/2021.    Allergies (verified) Patient has no known allergies.   History: Past Medical History:  Diagnosis Date   COPD (chronic obstructive pulmonary disease) (HCC) 02/21/2017   Diabetes mellitus without complication (HCC)    GERD (gastroesophageal reflux disease)    Hyperlipidemia    Past Surgical History:  Procedure Laterality Date   CIRCUMCISION  1974   Family History  Problem Relation Age of Onset  Hypertension Mother    Diabetes Mother    Diabetes Sister    Early death Sister    Early death Brother    Early death Brother    Social History   Socioeconomic History   Marital status: Married    Spouse name: Mary   Number of children: 3   Years of education:  12   Highest education level: 12th grade  Occupational History   Occupation: Designer, fashion/clothing    Comment: Disabled  Tobacco Use   Smoking status: Former    Packs/day: 0.25    Years: 50.00    Total pack years: 12.50    Types: Cigarettes    Quit date: 04/06/2020    Years since quitting: 1.4   Smokeless tobacco: Never   Tobacco comments:    about 3 to 4 cigarettes a day - finally quit completely  Vaping Use   Vaping Use: Never used  Substance and Sexual Activity   Alcohol use: No    Alcohol/week: 0.0 standard drinks of alcohol   Drug use: No   Sexual activity: Yes  Other Topics Concern   Not on file  Social History Narrative   Lives on one level home with wife   They enjoy going dancing every Thursday night   Social Determinants of Health   Financial Resource Strain: Low Risk  (09/28/2021)   Overall Financial Resource Strain (CARDIA)    Difficulty of Paying Living Expenses: Not hard at all  Food Insecurity: No Food Insecurity (09/28/2021)   Hunger Vital Sign    Worried About Running Out of Food in the Last Year: Never true    Ran Out of Food in the Last Year: Never true  Transportation Needs: No Transportation Needs (09/28/2021)   PRAPARE - Administrator, Civil Service (Medical): No    Lack of Transportation (Non-Medical): No  Physical Activity: Insufficiently Active (09/28/2021)   Exercise Vital Sign    Days of Exercise per Week: 6 days    Minutes of Exercise per Session: 20 min  Stress: No Stress Concern Present (09/28/2021)   [employer] of Occupational Health - Occupational Stress Questionnaire    Feeling of Stress : Not at all  Social Connections: Moderately Integrated (09/28/2021)   Social Connection and Isolation Panel [NHANES]    Frequency of Communication with Friends and Family: More than three times a week    Frequency of Social Gatherings with Friends and Family: More than three times a week    Attends Religious Services: Never    Doctor, general practice or Organizations: Yes    Attends Engineer, structural: More than 4 times per year    Marital Status: Married    Tobacco Counseling Counseling given: Not Answered Tobacco comments: about 3 to 4 cigarettes a day - finally quit completely   Clinical Intake:  Pre-visit preparation completed: Yes  Pain : No/denies pain     BMI - recorded: 28.41 Nutritional Status: BMI 25 -29 Overweight Nutritional Risks: None Diabetes: Yes CBG done?: No Did pt. bring in CBG monitor from home?: No  How often do you need to have someone help you when you read instructions, pamphlets, or other written materials from your doctor or pharmacy?: 1 - Never  Diabetic? Nutrition Risk Assessment:  Has the patient had any N/V/D within the last 2 months?  No  Does the patient have any non-healing wounds?  No  Has the patient had any unintentional weight loss or weight gain?  No   Diabetes:  Is the patient diabetic?  Yes  If diabetic, was a CBG obtained today?  No  Did the patient bring in their glucometer from home?  No  How often do you monitor your CBG's? Once daily when he has a glucometer - he has misplaced his - separate note sent for rf.   Financial Strains and Diabetes Management:  Are you having any financial strains with the device, your supplies or your medication? No .  Does the patient want to be seen by Chronic Care Management for management of their diabetes?  No  Would the patient like to be referred to a Nutritionist or for Diabetic Management?  No   Diabetic Exams:  Diabetic Eye Exam: Completed 2023 per pt - we need record.   Diabetic Foot Exam: Completed 2019. Pt has been advised about the importance in completing this exam. Pt is scheduled for diabetic foot exam on 10/13/2021.    Interpreter Needed?: No  Information entered by :: Irania Durell, LPN   Activities of Daily Living    09/28/2021   11:20 AM  In your present state of health, do you have any difficulty  performing the following activities:  Hearing? 0  Vision? 0  Difficulty concentrating or making decisions? 0  Walking or climbing stairs? 0  Dressing or bathing? 0  Doing errands, shopping? 0  Preparing Food and eating ? N  Using the Toilet? N  In the past six months, have you accidently leaked urine? N  Do you have problems with loss of bowel control? N  Managing your Medications? N  Managing your Finances? N  Housekeeping or managing your Housekeeping? N    Patient Care Team: Sharion Balloon, FNP as PCP - General (Nurse Practitioner) Sherrlyn Hock, MD as Consulting Physician  Indicate any recent Medical Services you may have received from other than [facility] providers in the past year (date may be approximate).     Assessment:   This is a routine wellness examination for Taji.  Hearing/Vision screen Hearing Screening - Comments:: Denies hearing difficulties   Vision Screening - Comments:: Wears rx glasses - up to date with routine eye exams with [facility]  Dietary issues and exercise activities discussed: Current Exercise Habits: Home exercise routine, Type of exercise: walking;calisthenics, Time (Minutes): 20, Frequency (Times/Week): 6, Weekly Exercise (Minutes/Week): 120, Intensity: Mild, Exercise limited by: respiratory conditions(s)   Goals Addressed             This Visit's Progress    Exercise 150 min/wk Moderate Activity   On track    Patient Stated   On track    09/28/2021 AWV Goal: Fall Prevention  Over the next year, patient will decrease their risk for falls by: Using assistive devices, such as a cane or walker, as needed Identifying fall risks within their home and correcting them by: Removing throw rugs Adding handrails to stairs or ramps Removing clutter and keeping a clear pathway throughout the home Increasing light, especially at night Adding shower handles/bars Raising toilet seat Identifying potential personal risk factors for  falls: Medication side effects Incontinence/urgency Vestibular dysfunction Hearing loss Musculoskeletal disorders Neurological disorders Orthostatic hypotension         Depression Screen    09/28/2021   11:19 AM 09/16/2020    9:04 AM 09/08/2020   10:15 AM 12/10/2018    2:17 PM 12/06/2018   11:25 AM 06/07/2017    8:18 AM 05/23/2017    1:28 PM  PHQ  2/9 Scores  PHQ - 2 Score 0 0 0 0 0 0 0    Fall Risk    09/28/2021   11:14 AM 09/21/2020    1:40 PM 09/16/2020    9:03 AM 09/08/2020   10:15 AM 12/10/2018    2:17 PM  Fall Risk   Falls in the past year? 0 0 0 0 0  Number falls in past yr: 0      Injury with Fall? 0      Risk for fall due to : No Fall Risks      Follow up Falls prevention discussed Falls evaluation completed       [place]:  Any stairs in or around the home? No  If so, are there any without handrails? No  Home free of loose throw rugs in walkways, pet beds, electrical cords, etc? Yes  Adequate lighting in your home to reduce risk of falls? Yes   ASSISTIVE DEVICES UTILIZED TO PREVENT FALLS:  Life alert? No  Use of a cane, walker or w/c? No  Grab bars in the bathroom? No  Shower chair or bench in shower? No  Elevated toilet seat or a handicapped toilet? No   TIMED UP AND GO:  Was the test performed? No . Telephonic visit  Cognitive Function:    05/23/2017    1:41 PM 05/23/2016    3:21 PM  MMSE - Mini Mental State Exam  Orientation to time 5 4  Orientation to Place 5 5  Registration 3 3  Attention/ Calculation 5 5  Recall 3 3  Language- name 2 objects 2 2  Language- repeat 1 1  Language- follow 3 step command 3 3  Language- read & follow direction 1 1  Write a sentence 1 1  Copy design 1 1  Total score 30 29        09/28/2021   11:33 AM 09/21/2020    1:39 PM  6CIT Screen  What Year? 0 points 0 points  What month? 0 points 0 points  What time? 0 points 0 points  Count back from 20 0 points 0 points  Months  in reverse 0 points 0 points  Repeat phrase 0 points 0 points  Total Score 0 points 0 points    Immunizations Immunization History  Administered Date(s) Administered   Fluad Quad(high Dose 65+) 12/06/2018   Influenza Inj Mdck Quad Pf 12/22/2016   Influenza,inj,Quad PF,6+ Mos 12/30/2014, 01/18/2018   Influenza-Unspecified 12/22/2016   Moderna Sars-Covid-2 Vaccination 05/21/2019, 06/18/2019   Pneumococcal Conjugate-13 09/28/2014   Pneumococcal Polysaccharide-23 12/08/2020   Tdap 09/28/2014   Zoster Recombinat (Shingrix) 12/08/2020    TDAP status: Up to date  Flu Vaccine status: Up to date  Pneumococcal vaccine status: Up to date  Covid-19 vaccine status: Completed vaccines  Qualifies for Shingles Vaccine? Yes   Zostavax completed Yes   Shingrix Completed?: No.    Education has been provided regarding the importance of this vaccine. Patient has been advised to call insurance company to determine out of pocket expense if they have not yet received this vaccine. Advised may also receive vaccine at local pharmacy or Health Dept. Verbalized acceptance and understanding.  Screening Tests Health Maintenance  Topic Date Due   OPHTHALMOLOGY EXAM  05/24/2017   HEMOGLOBIN A1C  12/07/2017   FOOT EXAM  06/08/2018   URINE MICROALBUMIN  06/08/2018   COVID-19 Vaccine (3 - Moderna risk series) 07/16/2019   Zoster Vaccines- Shingrix (2  of 2) 02/02/2021   INFLUENZA VACCINE  10/04/2021   COLONOSCOPY (Pts 45-65yrs Insurance coverage will need to be confirmed)  03/11/2023   TETANUS/TDAP  09/27/2024   Pneumonia Vaccine 47+ Years old  Completed   Hepatitis C Screening  Completed   HPV VACCINES  Aged Out    Health Maintenance  Health Maintenance Due  Topic Date Due   OPHTHALMOLOGY EXAM  05/24/2017   HEMOGLOBIN A1C  12/07/2017   FOOT EXAM  06/08/2018   URINE MICROALBUMIN  06/08/2018   COVID-19 Vaccine (3 - Moderna risk series) 07/16/2019   Zoster Vaccines- Shingrix (2 of 2) 02/02/2021     Colorectal cancer screening: Type of screening: Colonoscopy. Completed 03/10/2013. Repeat every 10 years  Lung Cancer Screening: (Low Dose CT Chest recommended if Age 60-80 years, 30 pack-year currently smoking OR have quit w/in 15years.) does qualify.   Lung Cancer Screening Referral: declines at this time  Additional Screening:  Hepatitis C Screening: does qualify; Completed 02/21/2017  Vision Screening: Recommended annual ophthalmology exams for early detection of glaucoma and other disorders of the eye. Is the patient up to date with their annual eye exam?  Yes  Who is the provider or what is the name of the office in which the patient attends annual eye exams? [facility] If pt is not established with a provider, would they like to be referred to a provider to establish care? No .   Dental Screening: Recommended annual dental exams for proper oral hygiene  Community Resource Referral / Chronic Care Management: CRR required this visit?  No   CCM required this visit?  No      Plan:     I have personally reviewed and noted the following in the patient's chart:   Medical and social history Use of alcohol, tobacco or illicit drugs  Current medications and supplements including opioid prescriptions. Patient is not currently taking opioid prescriptions. Functional ability and status Nutritional status Physical activity Advanced directives List of other physicians Hospitalizations, surgeries, and ER visits in previous 12 months Vitals Screenings to include cognitive, depression, and falls Referrals and appointments  In addition, I have reviewed and discussed with patient certain preventive protocols, quality metrics, and best practice recommendations. A written personalized care plan for preventive services as well as general preventive health recommendations were provided to patient.     [place] Constable, LPN   [id number]   Nurse Notes: lost glucometer - needs new rx  - separate note sent . He is due for lung cancer screening - wants to discuss with PCP first

## 2021-09-28 NOTE — Telephone Encounter (Signed)
Yes

## 2021-09-28 NOTE — Patient Instructions (Signed)
[name] , Thank you for taking time to come for your Medicare Wellness Visit. I appreciate your ongoing commitment to your health goals. Please review the following plan we discussed and let me know if I can assist you in the future.   Screening recommendations/referrals: Colonoscopy: Done 03/10/2013 - Repeat in 10 years  Recommended yearly ophthalmology/optometry visit for glaucoma screening and checkup Recommended yearly dental visit for hygiene and checkup  Vaccinations: Influenza vaccine: Done 2022 - repeat every fall Pneumococcal vaccine: Done 09/28/2014 & 12/08/2020 Tdap vaccine: Done 09/28/2014 - Repeat in 10 years  Shingles vaccine: Done 12/08/2020 - # 2 due at next visit or get at pharmacy   Covid-19: Done 05/21/2019 & 06/18/2019    Advanced directives: Advance directive discussed with you today. Even though you declined this today, please call our office should you change your mind, and we can give you the proper paperwork for you to fill out.   Conditions/risks identified: Aim for 30 minutes of exercise or brisk walking, 6-8 glasses of water, and 5 servings of fruits and vegetables each day.   Next appointment: Follow up in one year for your annual wellness visit.   Preventive Care 68 Years and Older, Male  Preventive care refers to lifestyle choices and visits with your health care provider that can promote health and wellness. What does preventive care include? A yearly physical exam. This is also called an annual well check. Dental exams once or twice a year. Routine eye exams. Ask your health care provider how often you should have your eyes checked. Personal lifestyle choices, including: Daily care of your teeth and gums. Regular physical activity. Eating a healthy diet. Avoiding tobacco and drug use. Limiting alcohol use. Practicing safe sex. Taking low doses of aspirin every day. Taking vitamin and mineral supplements as recommended by your health care provider. What  happens during an annual well check? The services and screenings done by your health care provider during your annual well check will depend on your age, overall health, lifestyle risk factors, and family history of disease. Counseling  Your health care provider may ask you questions about your: Alcohol use. Tobacco use. Drug use. Emotional well-being. Home and relationship well-being. Sexual activity. Eating habits. History of falls. Memory and ability to understand (cognition). Work and work Astronomer. Screening  You may have the following tests or measurements: Height, weight, and BMI. Blood pressure. Lipid and cholesterol levels. These may be checked every 5 years, or more frequently if you are over 45 years old. Skin check. Lung cancer screening. You may have this screening every year starting at age 78 if you have a 30-pack-year history of smoking and currently smoke or have quit within the past 15 years. Fecal occult blood test (FOBT) of the stool. You may have this test every year starting at age 62. Flexible sigmoidoscopy or colonoscopy. You may have a sigmoidoscopy every 5 years or a colonoscopy every 10 years starting at age 27. Prostate cancer screening. Recommendations will vary depending on your family history and other risks. Hepatitis C blood test. Hepatitis B blood test. Sexually transmitted disease (STD) testing. Diabetes screening. This is done by checking your blood sugar (glucose) after you have not eaten for a while (fasting). You may have this done every 1-3 years. Abdominal aortic aneurysm (AAA) screening. You may need this if you are a current or former smoker. Osteoporosis. You may be screened starting at age 63 if you are at high risk. Talk with your health  care provider about your test results, treatment options, and if necessary, the need for more tests. Vaccines  Your health care provider may recommend certain vaccines, such as: Influenza vaccine. This  is recommended every year. Tetanus, diphtheria, and acellular pertussis (Tdap, Td) vaccine. You may need a Td booster every 10 years. Zoster vaccine. You may need this after age 58. Pneumococcal 13-valent conjugate (PCV13) vaccine. One dose is recommended after age 55. Pneumococcal polysaccharide (PPSV23) vaccine. One dose is recommended after age 74. Talk to your health care provider about which screenings and vaccines you need and how often you need them. This information is not intended to replace advice given to you by your health care provider. Make sure you discuss any questions you have with your health care provider. Document Released: 03/19/2015 Document Revised: 11/10/2015 Document Reviewed: 12/22/2014 Elsevier Interactive Patient Education  2017 [employer].  Fall Prevention in the Home Falls can cause injuries. They can happen to people of all ages. There are many things you can do to make your home safe and to help prevent falls. What can I do on the outside of my home? Regularly fix the edges of walkways and driveways and fix any cracks. Remove anything that might make you trip as you walk through a door, such as a raised step or threshold. Trim any bushes or trees on the path to your home. Use bright outdoor lighting. Clear any walking paths of anything that might make someone trip, such as rocks or tools. Regularly check to see if handrails are loose or broken. Make sure that both sides of any steps have handrails. Any raised decks and porches should have guardrails on the edges. Have any leaves, snow, or ice cleared regularly. Use sand or salt on walking paths during winter. Clean up any spills in your garage right away. This includes oil or grease spills. What can I do in the bathroom? Use night lights. Install grab bars by the toilet and in the tub and shower. Do not use towel bars as grab bars. Use non-skid mats or decals in the tub or shower. If you need to sit down  in the shower, use a plastic, non-slip stool. Keep the floor dry. Clean up any water that spills on the floor as soon as it happens. Remove soap buildup in the tub or shower regularly. Attach bath mats securely with double-sided non-slip rug tape. Do not have throw rugs and other things on the floor that can make you trip. What can I do in the bedroom? Use night lights. Make sure that you have a light by your bed that is easy to reach. Do not use any sheets or blankets that are too big for your bed. They should not hang down onto the floor. Have a firm chair that has side arms. You can use this for support while you get dressed. Do not have throw rugs and other things on the floor that can make you trip. What can I do in the kitchen? Clean up any spills right away. Avoid walking on wet floors. Keep items that you use a lot in easy-to-reach places. If you need to reach something above you, use a strong step stool that has a grab bar. Keep electrical cords out of the way. Do not use floor polish or wax that makes floors slippery. If you must use wax, use non-skid floor wax. Do not have throw rugs and other things on the floor that can make you trip. What can  I do with my stairs? Do not leave any items on the stairs. Make sure that there are handrails on both sides of the stairs and use them. Fix handrails that are broken or loose. Make sure that handrails are as long as the stairways. Check any carpeting to make sure that it is firmly attached to the stairs. Fix any carpet that is loose or worn. Avoid having throw rugs at the top or bottom of the stairs. If you do have throw rugs, attach them to the floor with carpet tape. Make sure that you have a light switch at the top of the stairs and the bottom of the stairs. If you do not have them, ask someone to add them for you. What else can I do to help prevent falls? Wear shoes that: Do not have high heels. Have rubber bottoms. Are comfortable  and fit you well. Are closed at the toe. Do not wear sandals. If you use a stepladder: Make sure that it is fully opened. Do not climb a closed stepladder. Make sure that both sides of the stepladder are locked into place. Ask someone to hold it for you, if possible. Clearly mark and make sure that you can see: Any grab bars or handrails. First and last steps. Where the edge of each step is. Use tools that help you move around (mobility aids) if they are needed. These include: Canes. Walkers. Scooters. Crutches. Turn on the lights when you go into a dark area. Replace any light bulbs as soon as they burn out. Set up your furniture so you have a clear path. Avoid moving your furniture around. If any of your floors are uneven, fix them. If there are any pets around you, be aware of where they are. Review your medicines with your doctor. Some medicines can make you feel dizzy. This can increase your chance of falling. Ask your doctor what other things that you can do to help prevent falls. This information is not intended to replace advice given to you by your health care provider. Make sure you discuss any questions you have with your health care provider. Document Released: 12/17/2008 Document Revised: 07/29/2015 Document Reviewed: 03/27/2014 Elsevier Interactive Patient Education  2017 [employer].

## 2021-10-05 DIAGNOSIS — E119 Type 2 diabetes mellitus without complications: Secondary | ICD-10-CM | POA: Diagnosis not present

## 2021-10-05 DIAGNOSIS — F17210 Nicotine dependence, cigarettes, uncomplicated: Secondary | ICD-10-CM | POA: Diagnosis not present

## 2021-10-05 DIAGNOSIS — K625 Hemorrhage of anus and rectum: Secondary | ICD-10-CM | POA: Diagnosis not present

## 2021-10-05 DIAGNOSIS — M5136 Other intervertebral disc degeneration, lumbar region: Secondary | ICD-10-CM | POA: Diagnosis not present

## 2021-10-05 DIAGNOSIS — K760 Fatty (change of) liver, not elsewhere classified: Secondary | ICD-10-CM | POA: Diagnosis not present

## 2021-10-05 DIAGNOSIS — N400 Enlarged prostate without lower urinary tract symptoms: Secondary | ICD-10-CM | POA: Diagnosis not present

## 2021-10-05 DIAGNOSIS — I700 Atherosclerosis of aorta: Secondary | ICD-10-CM | POA: Diagnosis not present

## 2021-10-05 DIAGNOSIS — K8020 Calculus of gallbladder without cholecystitis without obstruction: Secondary | ICD-10-CM | POA: Diagnosis not present

## 2021-10-05 DIAGNOSIS — M5137 Other intervertebral disc degeneration, lumbosacral region: Secondary | ICD-10-CM | POA: Diagnosis not present

## 2021-10-13 ENCOUNTER — Ambulatory Visit (INDEPENDENT_AMBULATORY_CARE_PROVIDER_SITE_OTHER): Payer: No Typology Code available for payment source | Admitting: Family

## 2021-10-13 ENCOUNTER — Encounter: Payer: Self-pay | Admitting: Family

## 2021-10-13 VITALS — BP 139/81 | HR 94 | Temp 98.3°F | Ht 66.0 in | Wt 174.6 lb

## 2021-10-13 DIAGNOSIS — K219 Gastro-esophageal reflux disease without esophagitis: Secondary | ICD-10-CM | POA: Diagnosis not present

## 2021-10-13 DIAGNOSIS — Z23 Encounter for immunization: Secondary | ICD-10-CM

## 2021-10-13 DIAGNOSIS — K760 Fatty (change of) liver, not elsewhere classified: Secondary | ICD-10-CM | POA: Diagnosis not present

## 2021-10-13 DIAGNOSIS — K529 Noninfective gastroenteritis and colitis, unspecified: Secondary | ICD-10-CM | POA: Diagnosis not present

## 2021-10-13 DIAGNOSIS — E1169 Type 2 diabetes mellitus with other specified complication: Secondary | ICD-10-CM | POA: Diagnosis not present

## 2021-10-13 DIAGNOSIS — J410 Simple chronic bronchitis: Secondary | ICD-10-CM | POA: Diagnosis not present

## 2021-10-13 DIAGNOSIS — E785 Hyperlipidemia, unspecified: Secondary | ICD-10-CM

## 2021-10-13 DIAGNOSIS — Z0000 Encounter for general adult medical examination without abnormal findings: Secondary | ICD-10-CM

## 2021-10-13 DIAGNOSIS — N529 Male erectile dysfunction, unspecified: Secondary | ICD-10-CM | POA: Diagnosis not present

## 2021-10-13 DIAGNOSIS — Z09 Encounter for follow-up examination after completed treatment for conditions other than malignant neoplasm: Secondary | ICD-10-CM | POA: Diagnosis not present

## 2021-10-13 DIAGNOSIS — Z87891 Personal history of nicotine dependence: Secondary | ICD-10-CM

## 2021-10-13 LAB — BAYER DCA HB A1C WAIVED: HB A1C (BAYER DCA - WAIVED): 9 % — ABNORMAL HIGH (ref 4.8–5.6)

## 2021-10-13 MED ORDER — BLOOD GLUCOSE METER KIT
PACK | 0 refills | Status: AC
Start: 2021-10-13 — End: ?

## 2021-10-13 MED ORDER — SILDENAFIL CITRATE 100 MG PO TABS: 50.0000 mg | ORAL_TABLET | Freq: Every day | ORAL | 1 refills | Status: AC | PRN

## 2021-10-13 NOTE — Patient Instructions (Signed)

## 2021-10-13 NOTE — Progress Notes (Signed)
Subjective:    Patient ID: Ryan Mcintyre, male    DOB: 12-06-1955, 66 y.o.   MRN: [id number]  Chief Complaint  Patient presents with   Annual Exam    Was seen in the Er for collitis 08/02   Pt presents to the office today for CPE.   He has COPD and quit smoking 04/2020. Denies any SOB. Reports he smoked for 57 years.   He went to ED on 10/05/21 and was diagnosed with colitis. Currently taking Augmentin daily.   Complaining of having a hard time maintaining an erection. Requesting Viagra today.  Diabetes He presents for his follow-up diabetic visit. He has type 2 diabetes mellitus. Pertinent negatives for diabetes include no blurred vision and no foot paresthesias. Symptoms are stable. Risk factors for coronary artery disease include dyslipidemia, diabetes mellitus, male sex, hypertension and sedentary lifestyle. He is following a generally healthy diet. (Does not check BS at home) Eye exam is current.  Hyperlipidemia This is a chronic problem. The current episode started more than 1 year ago. The problem is uncontrolled. He is currently on no antihyperlipidemic treatment. The current treatment provides no improvement of lipids. Risk factors for coronary artery disease include dyslipidemia, diabetes mellitus, male sex and a sedentary lifestyle.  Gastroesophageal Reflux He complains of belching and heartburn. This is a chronic problem. The current episode started more than 1 year ago. The problem occurs occasionally. Risk factors include obesity. He has tried a PPI for the symptoms. The treatment provided moderate relief.      Review of Systems  Eyes:  Negative for blurred vision.  Gastrointestinal:  Positive for heartburn.  All other systems reviewed and are negative.  Family History  Problem Relation Age of Onset   Hypertension Mother    Diabetes Mother    Diabetes Sister    Early death Sister    Early death Brother    Early death Brother    Social History   Socioeconomic  History   Marital status: Married    Spouse name: Ryan Mcintyre   Number of children: 3   Years of education: 12   Highest education level: 12th grade  Occupational History   Occupation: Charity fundraiser    Comment: Disabled  Tobacco Use   Smoking status: Former    Packs/day: 0.25    Years: 50.00    Total pack years: 12.50    Types: Cigarettes    Quit date: 04/06/2020    Years since quitting: 1.5   Smokeless tobacco: Never   Tobacco comments:    about 3 to 4 cigarettes a day - finally quit completely  Vaping Use   Vaping Use: Never used  Substance and Sexual Activity   Alcohol use: No    Alcohol/week: 0.0 standard drinks of alcohol   Drug use: No   Sexual activity: Yes  Other Topics Concern   Not on file  Social History Narrative   Lives on one level home with wife   They enjoy going dancing every Thursday night   Social Determinants of Health   Financial Resource Strain: Low Risk  (09/28/2021)   Overall Financial Resource Strain (CARDIA)    Difficulty of Paying Living Expenses: Not hard at all  Food Insecurity: No Food Insecurity (09/28/2021)   Hunger Vital Sign    Worried About Running Out of Food in the Last Year: Never true    Ran Out of Food in the Last Year: Never true  Transportation Needs: No Transportation Needs (09/28/2021)  PRAPARE - Hydrologist (Medical): No    Lack of Transportation (Non-Medical): No  Physical Activity: Insufficiently Active (09/28/2021)   Exercise Vital Sign    Days of Exercise per Week: 6 days    Minutes of Exercise per Session: 20 min  Stress: No Stress Concern Present (09/28/2021)   [place]    Feeling of Stress : Not at all  Social Connections: Moderately Integrated (09/28/2021)   Social Connection and Isolation Panel [NHANES]    Frequency of Communication with Friends and Family: More than three times a week    Frequency of Social Gatherings with Friends  and Family: More than three times a week    Attends Religious Services: Never    Marine scientist or Organizations: Yes    Attends Music therapist: More than 4 times per year    Marital Status: Married       Objective:   Physical Exam Vitals reviewed.  Constitutional:      General: He is not in acute distress.    Appearance: He is well-developed.  HENT:     Head: Normocephalic.     Right Ear: Tympanic membrane normal.     Left Ear: Tympanic membrane normal.  Eyes:     General:        Right eye: No discharge.        Left eye: No discharge.     Pupils: Pupils are equal, round, and reactive to light.  Neck:     Thyroid: No thyromegaly.  Cardiovascular:     Rate and Rhythm: Normal rate and regular rhythm.     Heart sounds: Normal heart sounds. No murmur heard. Pulmonary:     Effort: Pulmonary effort is normal. No respiratory distress.     Breath sounds: Normal breath sounds. No wheezing.  Abdominal:     General: Bowel sounds are normal. There is no distension.     Palpations: Abdomen is soft.     Tenderness: There is no abdominal tenderness.  Musculoskeletal:        General: No tenderness. Normal range of motion.     Cervical back: Normal range of motion and neck supple.  Skin:    General: Skin is warm and dry.     Findings: No erythema or rash.  Neurological:     Mental Status: He is alert and oriented to person, place, and time.     Cranial Nerves: No cranial nerve deficit.     Deep Tendon Reflexes: Reflexes are normal and symmetric.  Psychiatric:        Behavior: Behavior normal.        Thought Content: Thought content normal.        Judgment: Judgment normal.     Diabetic Foot Exam - Simple   Simple Foot Form Diabetic Foot exam was performed with the following findings: Yes 10/13/2021 12:07 PM  Visual Inspection No deformities, no ulcerations, no other skin breakdown bilaterally: Yes Sensation Testing Intact to touch and monofilament  testing bilaterally: Yes Pulse Check Posterior Tibialis and Dorsalis pulse intact bilaterally: Yes Comments Nails long      BP 139/81   Pulse 94   Temp 98.3 F (36.8 C) (Temporal)   Ht $R'5\' 6"'gs$  (1.676 m)   Wt 174 lb 9.6 oz (79.2 kg)   SpO2 97%   BMI 28.18 kg/m      Assessment & Plan:  Ryan Mcintyre  comes in today with chief complaint of Annual Exam (Was seen in the Er for collitis 08/02)   Diagnosis and orders addressed:  1. Annual physical exam - Microalbumin / creatinine urine ratio - Bayer DCA Hb A1c Waived - CMP14+EGFR - CBC with Differential/Platelet - Lipid panel - TSH - CT CHEST LUNG CA SCREEN LOW DOSE W/O CM; Future  2. Simple chronic bronchitis (HCC) - CMP14+EGFR - CBC with Differential/Platelet  3. Gastroesophageal reflux disease, unspecified whether esophagitis present - CMP14+EGFR - CBC with Differential/Platelet  4. Hepatic steatosis - CMP14+EGFR - CBC with Differential/Platelet  5. Type 2 diabetes mellitus with other specified complication, without long-term current use of insulin (HCC) - Microalbumin / creatinine urine ratio - Bayer DCA Hb A1c Waived - CMP14+EGFR - CBC with Differential/Platelet - blood glucose meter kit and supplies; Dispense based on patient and insurance preference. Use up to four times daily as directed. (FOR ICD-10 E10.9, E11.9).  Dispense: 1 each; Refill: 0  6. Hyperlipidemia associated with type 2 diabetes mellitus (HCC) - CMP14+EGFR - CBC with Differential/Platelet - Lipid panel  7. Former smoker - CMP14+EGFR - CBC with Differential/Platelet - CT CHEST LUNG CA SCREEN LOW DOSE W/O CM; Future  8. History of smoking greater than 50 pack years - CT CHEST LUNG CA SCREEN LOW DOSE W/O CM; Future  9. Erectile dysfunction, unspecified erectile dysfunction type - sildenafil (VIAGRA) 100 MG tablet; Take 0.5-1 tablets (50-100 mg total) by mouth daily as needed for erectile dysfunction.  Dispense: 30 tablet; Refill: 1  10.  Hospital discharge follow-up  11. Colitis Complete Augmentin    Labs pending Health Maintenance reviewed Diet and exercise encouraged  Follow up plan: 6 months    Evelina Dun, FNP

## 2021-10-14 ENCOUNTER — Other Ambulatory Visit: Payer: Self-pay | Admitting: Family

## 2021-10-14 DIAGNOSIS — E1169 Type 2 diabetes mellitus with other specified complication: Secondary | ICD-10-CM

## 2021-10-14 DIAGNOSIS — E785 Hyperlipidemia, unspecified: Secondary | ICD-10-CM

## 2021-10-14 LAB — CBC WITH DIFFERENTIAL/PLATELET
Basophils Absolute: 0.1 10*3/uL (ref 0.0–0.2)
Basos: 1 %
EOS (ABSOLUTE): 0.3 10*3/uL (ref 0.0–0.4)
Eos: 3 %
Hematocrit: 44.6 % (ref 37.5–51.0)
Hemoglobin: 14.6 g/dL (ref 13.0–17.7)
Immature Grans (Abs): 0 10*3/uL (ref 0.0–0.1)
Immature Granulocytes: 1 %
Lymphocytes Absolute: 1.9 10*3/uL (ref 0.7–3.1)
Lymphs: 24 %
MCH: 29.6 pg (ref 26.6–33.0)
MCHC: 32.7 g/dL (ref 31.5–35.7)
MCV: 91 fL (ref 79–97)
Monocytes Absolute: 0.6 10*3/uL (ref 0.1–0.9)
Monocytes: 7 %
Neutrophils Absolute: 5.2 10*3/uL (ref 1.4–7.0)
Neutrophils: 64 %
Platelets: 289 10*3/uL (ref 150–450)
RBC: 4.93 x10E6/uL (ref 4.14–5.80)
RDW: 13.1 % (ref 11.6–15.4)
WBC: 8.1 10*3/uL (ref 3.4–10.8)

## 2021-10-14 LAB — CMP14+EGFR
ALT: 16 IU/L (ref 0–44)
AST: 12 IU/L (ref 0–40)
Albumin/Globulin Ratio: 1.8 (ref 1.2–2.2)
Albumin: 4.3 g/dL (ref 3.9–4.9)
Alkaline Phosphatase: 87 IU/L (ref 44–121)
BUN/Creatinine Ratio: 13 (ref 10–24)
BUN: 13 mg/dL (ref 8–27)
Bilirubin Total: <0.2 mg/dL (ref 0.0–1.2)
CO2: 27 mmol/L (ref 20–29)
Calcium: 9.3 mg/dL (ref 8.6–10.2)
Chloride: 101 mmol/L (ref 96–106)
Creatinine, Ser: 1 mg/dL (ref 0.76–1.27)
Globulin, Total: 2.4 g/dL (ref 1.5–4.5)
Glucose: 292 mg/dL — ABNORMAL HIGH (ref 70–99)
Potassium: 4.9 mmol/L (ref 3.5–5.2)
Sodium: 142 mmol/L (ref 134–144)
Total Protein: 6.7 g/dL (ref 6.0–8.5)
eGFR: 82 mL/min/{1.73_m2} (ref >59)

## 2021-10-14 LAB — LIPID PANEL
Chol/HDL Ratio: 5.7 ratio — ABNORMAL HIGH (ref 0.0–5.0)
Cholesterol, Total: 166 mg/dL (ref 100–199)
HDL: 29 mg/dL — ABNORMAL LOW (ref >39)
LDL Chol Calc (NIH): 76 mg/dL (ref 0–99)
Triglycerides: 383 mg/dL — ABNORMAL HIGH (ref 0–149)
VLDL Cholesterol Cal: 61 mg/dL — ABNORMAL HIGH (ref 5–40)

## 2021-10-14 LAB — TSH: TSH: 1.94 u[IU]/mL (ref 0.450–4.500)

## 2021-10-14 LAB — MICROALBUMIN / CREATININE URINE RATIO
Creatinine, Urine: 84.7 mg/dL
Microalb/Creat Ratio: <4 mg/g creat (ref 0–29)
Microalbumin, Urine: <3 ug/mL

## 2021-10-14 MED ORDER — ROSUVASTATIN CALCIUM 10 MG PO TABS: 10.0000 mg | ORAL_TABLET | Freq: Every day | ORAL | 3 refills | Status: DC

## 2021-10-14 MED ORDER — EMPAGLIFLOZIN 10 MG PO TABS: 10.0000 mg | ORAL_TABLET | Freq: Every day | ORAL | 1 refills | Status: DC

## 2021-10-24 ENCOUNTER — Telehealth: Payer: Self-pay

## 2021-10-24 NOTE — Chronic Care Management (AMB) (Signed)
  Chronic Care Management   Note  10/24/2021 Name: DRAYDEN LUKAS MRN: [id number] DOB: Jul 27, 1955  MATTEO BANKE is a 66 y.o. year old male who is a primary care patient of Sharion Balloon, FNP. I reached out to [name] by phone today in response to a referral sent by Mr. Jashad Depaula Furnish's PCP.  Mr. Gorrell was given information about Chronic Care Management services today including:  CCM service includes personalized support from designated clinical staff supervised by his physician, including individualized plan of care and coordination with other care providers 24/7 contact phone numbers for assistance for urgent and routine care needs. Service will only be billed when office clinical staff spend 20 minutes or more in a month to coordinate care. Only one practitioner may furnish and bill the service in a calendar month. The patient may stop CCM services at any time (effective at the end of the month) by phone call to the office staff. The patient is responsible for co-pay (up to 20% after annual deductible is met) if co-pay is required by the individual health plan.   Patient agreed to services and verbal consent obtained.   Follow up plan: Telephone appointment with care management team member scheduled for:12/06/2021  Noreene Larsson, [place], [place] [address] Direct Dial: [phone] [email or web address]

## 2021-12-06 ENCOUNTER — Ambulatory Visit (INDEPENDENT_AMBULATORY_CARE_PROVIDER_SITE_OTHER): Payer: No Typology Code available for payment source | Admitting: Pharmacist

## 2021-12-06 DIAGNOSIS — E1169 Type 2 diabetes mellitus with other specified complication: Secondary | ICD-10-CM

## 2021-12-06 DIAGNOSIS — E119 Type 2 diabetes mellitus without complications: Secondary | ICD-10-CM

## 2021-12-06 MED ORDER — ROSUVASTATIN CALCIUM 10 MG PO TABS: 10.0000 mg | ORAL_TABLET | Freq: Every day | ORAL | 3 refills | Status: DC

## 2021-12-06 MED ORDER — METFORMIN HCL 1000 MG PO TABS: 1000.0000 mg | ORAL_TABLET | Freq: Two times a day (BID) | ORAL | 3 refills | Status: DC

## 2021-12-06 MED ORDER — DAPAGLIFLOZIN PROPANEDIOL 10 MG PO TABS: 10.0000 mg | ORAL_TABLET | Freq: Every day | ORAL | 6 refills | Status: DC

## 2021-12-06 MED ORDER — GLIMEPIRIDE 4 MG PO TABS: 4.0000 mg | ORAL_TABLET | Freq: Every day | ORAL | 1 refills | Status: DC

## 2021-12-06 NOTE — Progress Notes (Signed)
Chronic Care Management Pharmacy Note  12/06/2021 Name:  Ryan Mcintyre MRN:  [id number] DOB:  03/13/1955  Summary:  Diabetes: New goal. Uncontrolled-A1C 9%, GFR 66; current treatment:METFORMIN, GLIMEPIRIDE, NEW START FARXIGA TODAY;  Current glucose readings: fasting glucose: N/A, post prandial glucose: N/A Current exercise: N/A Educated on RESTART DIABETES MEDICATIONS, ADDING FARXIGA, PLEASE CONTINUE CHECKING FASTING BLOOD SUGAR Assessed patient finances. WILL ENROLL IN [place] PATIENT ASSISTANCE   Patient Goals/Self-Care Activities patient will:  - take medications as prescribed as evidenced by patient report and record review check glucose FASTING/DAILY, document, and provide at future appointments collaborate with provider on medication access solutions target a minimum of 150 minutes of moderate intensity exercise weekly engage in dietary modifications by  FOLLOWING A HEART HEALTHY DIET/HEALTHY PLATE METHOD   Subjective: Ryan Mcintyre is an 66 y.o. year old male who is a primary patient of Sharion Balloon, FNP.  The CCM team was consulted for assistance with disease management and care coordination needs.    Engaged with patient by telephone for initial visit in response to provider referral for pharmacy case management and/or care coordination services.   Consent to Services:  The patient was given information about Chronic Care Management services, agreed to services, and gave verbal consent prior to initiation of services.  Please see initial visit note for detailed documentation.   Patient Care Team: Sharion Balloon, FNP as PCP - General (Nurse Practitioner) Sherrlyn Hock, MD as Consulting Physician Lavera Guise, [facility] as Pharmacist (Family Medicine)  Objective:  Lab Results  Component Value Date   CREATININE 1.20 12/16/2021   CREATININE 1.00 10/13/2021   CREATININE 1.04 06/07/2017    Lab Results  Component Value Date   HGBA1C 9.0 (H) 12/16/2021    Last diabetic Eye exam:  Lab Results  Component Value Date/Time   HMDIABEYEEXA No Retinopathy 05/24/2016 12:00 AM   HMDIABEYEEXA No Retinopathy 05/24/2016 12:00 AM    Last diabetic Foot exam: No results found for: "HMDIABFOOTEX"      Component Value Date/Time   CHOL 166 10/13/2021 1233   TRIG 383 (H) 10/13/2021 1233   HDL 29 (L) 10/13/2021 1233   CHOLHDL 5.7 (H) 10/13/2021 1233   LDLCALC 76 10/13/2021 1233   LDLDIRECT 83 12/30/2014 0903       Latest Ref Rng & Units 12/16/2021    8:52 AM 10/13/2021   12:33 PM 06/07/2017    8:52 AM  Hepatic Function  Total Protein 6.0 - 8.5 g/dL 7.0  6.7  7.0   Albumin 3.9 - 4.9 g/dL 4.4  4.3  4.3   AST 0 - 40 IU/L _0 ALT 0 - 44 IU/L _1 Alk Phosphatase 44 - 121 IU/L 72  87  78   Total Bilirubin 0.0 - 1.2 mg/dL 0.3  <0.2  0.3     Lab Results  Component Value Date/Time   TSH 1.940 10/13/2021 12:33 PM   TSH 1.340 09/28/2014 12:39 PM       Latest Ref Rng & Units 10/13/2021   12:33 PM  CBC  WBC 3.4 - 10.8 x10E3/uL 8.1   Hemoglobin 13.0 - 17.7 g/dL 14.6   Hematocrit 37.5 - 51.0 % 44.6   Platelets 150 - 450 x10E3/uL 289     Lab Results  Component Value Date/Time   VD25OH 30.5 09/28/2014 12:39 PM    Clinical ASCVD: No  The 10-year ASCVD risk score (Arnett DK, et  al., 2019) is: 41.8%   Values used to calculate the score:     Age: 66 years     Sex: Male     Is Non-Hispanic African American: No     Diabetic: Yes     Tobacco smoker: Yes     Systolic Blood Pressure: 482 mmHg     Is BP treated: No     HDL Cholesterol: 29 mg/dL     Total Cholesterol: 166 mg/dL    Other: (CHADS2VASc if Afib, PHQ9 if depression, MMRC or CAT for COPD, ACT, DEXA)  Social History   Tobacco Use  Smoking Status Former   Packs/day: 0.25   Years: 50.00   Total pack years: 12.50   Types: Cigarettes   Quit date: 04/06/2020   Years since quitting: 1.7  Smokeless Tobacco Never  Tobacco Comments   about 3 to 4 cigarettes a day -  finally quit completely   BP Readings from Last 3 Encounters:  12/16/21 132/81  10/13/21 139/81  09/16/20 135/80   Pulse Readings from Last 3 Encounters:  12/16/21 87  10/13/21 94  09/16/20 88   Wt Readings from Last 3 Encounters:  12/16/21 175 lb 9.6 oz (79.7 kg)  10/13/21 174 lb 9.6 oz (79.2 kg)  09/28/21 176 lb (79.8 kg)    Assessment: Review of patient past medical history, allergies, medications, health status, including review of consultants reports, laboratory and other test data, was performed as part of comprehensive evaluation and provision of chronic care management services.   SDOH:  (Social Determinants of Health) assessments and interventions performed:  SDOH Interventions    Flowsheet Row Clinical Support from 09/28/2021 in [place] Interventions   Food Insecurity Interventions Intervention Not Indicated  Housing Interventions Intervention Not Indicated  Transportation Interventions Intervention Not Indicated  Financial Strain Interventions Intervention Not Indicated  Physical Activity Interventions Intervention Not Indicated  Stress Interventions Intervention Not Indicated  Social Connections Interventions Intervention Not Indicated       CCM Care Plan  No Known Allergies  Medications Reviewed Today     Reviewed by Lavera Guise, [facility] (Pharmacist) on 12/27/21 at [place] List Status: <None>   Medication Order Taking? Sig Documenting Provider Last Dose Status Informant  blood glucose meter kit and supplies [id number] No Dispense based on patient and insurance preference. Use up to four times daily as directed. (FOR ICD-10 E10.9, E11.9). Sharion Balloon, FNP Taking Active   dapagliflozin propanediol (FARXIGA) 10 MG TABS tablet [id number] Yes Take 1 tablet (10 mg total) by mouth daily before breakfast. Sharion Balloon, FNP Taking Active   glimepiride (AMARYL) 4 MG tablet [id number] No Take 1 tablet (4 mg total) by mouth daily  before breakfast. Sharion Balloon, FNP Taking Active   glucose blood test strip [id number] No Test daily. Sharion Balloon, FNP Taking Active   metFORMIN (GLUCOPHAGE) 1000 MG tablet [id number] No Take 1 tablet (1,000 mg total) by mouth 2 (two) times daily with a meal. Sharion Balloon, FNP Taking Active   OMEPRAZOLE PO [id number] No Take by mouth. [provider] Taking Active   rosuvastatin (CRESTOR) 10 MG tablet [id number] No Take 1 tablet (10 mg total) by mouth daily. Sharion Balloon, FNP Taking Active   sildenafil (VIAGRA) 100 MG tablet [id number] No Take 0.5-1 tablets (50-100 mg total) by mouth daily as needed for erectile dysfunction. Sharion Balloon, FNP Taking Active  Patient Active Problem List   Diagnosis Date Noted   History of smoking greater than 50 pack years 12/16/2021   Hepatic steatosis 02/21/2017   COPD (chronic obstructive pulmonary disease) ([place]) 02/21/2017   Overweight (BMI 25.0-29.9) 04/11/2016   Former smoker 04/11/2016   Hyperlipidemia associated with type 2 diabetes mellitus ([place]) 09/30/2014   Diabetes mellitus, type 2 ([place]) 09/28/2014   GERD (gastroesophageal reflux disease) 09/28/2014    Immunization History  Administered Date(s) Administered   Fluad Quad(high Dose 65+) 12/06/2018, 12/16/2021   Influenza Inj Mdck Quad Pf 12/22/2016   Influenza,inj,Quad PF,6+ Mos 12/30/2014, 01/18/2018   Influenza-Unspecified 12/22/2016   Moderna Sars-Covid-2 Vaccination 05/21/2019, 06/18/2019   Pneumococcal Conjugate-13 09/28/2014   Pneumococcal Polysaccharide-23 12/08/2020   Tdap 09/28/2014   Zoster Recombinat (Shingrix) 12/08/2020, 10/13/2021    Conditions to be addressed/monitored: HTN and DMII  Care Plan : PHARMD MEDICATION MANAGEMENT  Updates made by Lavera Guise, [place] since 12/27/2021 12:00 AM     Problem: DISEASE PROGRESSION PREVENTION      Long-Range Goal: T2DM PHARMD   Note:   Current Barriers:  Unable to independently afford  treatment regimen Unable to achieve control of T2DM  Suboptimal therapeutic regimen for T2DM  Pharmacist Clinical Goal(s):  patient will verbalize ability to afford treatment regimen maintain control of T2DM as evidenced by IMPROVED GLYCEMIC, CONTROL--A1C<7%  through collaboration with PharmD and provider.    Interventions: 1:1 collaboration with Sharion Balloon, FNP regarding development and update of comprehensive plan of care as evidenced by provider attestation and co-signature Inter-disciplinary care team collaboration (see longitudinal plan of care) Comprehensive medication review performed; medication list updated in electronic medical record  Diabetes: New goal. Uncontrolled-A1C 9%, GFR 66; current treatment:METFORMIN, GLIMEPIRIDE, NEW START FARXIGA TODAY;  Current glucose readings: fasting glucose: N/A, post prandial glucose: N/A Current exercise: N/A Educated on RESTART DIABETES MEDICATIONS, ADDING FARXIGA, [place] patient finances. WILL ENROLL IN [place] PATIENT ASSISTANCE   Patient Goals/Self-Care Activities patient will:  - take medications as prescribed as evidenced by patient report and record review check glucose FASTING/DAILY, document, and provide at future appointments collaborate with provider on medication access solutions target a minimum of 150 minutes of moderate intensity exercise weekly engage in dietary modifications by    FOLLOWING A HEART HEALTHY DIET/HEALTHY PLATE METHOD      Medication Assistance: Application for [facility]  medication assistance program. in process.  Anticipated assistance start date TBD.  See plan of care for additional detail.  Patient's preferred pharmacy is:  [facility] [address] [place] [address] [place] [address] Phone: [phone] Fax: [phone]  [facility] [place], [place] [address] [place]  [address] Phone: [phone] Fax: [phone]  Plan: Telephone follow up appointment with care management team member scheduled for:  1 MONTH    Regina Eck, PharmD, BCPS Clinical Pharmacist, [place]  II Phone [phone]

## 2021-12-08 NOTE — Progress Notes (Signed)
[facility] Quality Team Note  Name: Ryan Mcintyre Date of Birth: 03/22/1955 MRN: [id number] Date: 12/08/2021  [facility] Quality Team has reviewed this patient's chart, please see recommendations below:  Diabetic Retinal Eye Exam; Patient requests [facility] Quality Coordinator to schedule Diabetic Retinal Screening at ([place] event 12/15/21 11:00am).

## 2021-12-15 LAB — HM DIABETES EYE EXAM

## 2021-12-16 ENCOUNTER — Ambulatory Visit (INDEPENDENT_AMBULATORY_CARE_PROVIDER_SITE_OTHER): Payer: No Typology Code available for payment source | Admitting: Family

## 2021-12-16 ENCOUNTER — Encounter: Payer: Self-pay | Admitting: Family

## 2021-12-16 VITALS — BP 132/81 | HR 87 | Temp 97.8°F | Ht 66.0 in | Wt 175.6 lb

## 2021-12-16 DIAGNOSIS — E1169 Type 2 diabetes mellitus with other specified complication: Secondary | ICD-10-CM | POA: Diagnosis not present

## 2021-12-16 DIAGNOSIS — J410 Simple chronic bronchitis: Secondary | ICD-10-CM

## 2021-12-16 DIAGNOSIS — Z23 Encounter for immunization: Secondary | ICD-10-CM | POA: Diagnosis not present

## 2021-12-16 DIAGNOSIS — Z87891 Personal history of nicotine dependence: Secondary | ICD-10-CM

## 2021-12-16 DIAGNOSIS — K219 Gastro-esophageal reflux disease without esophagitis: Secondary | ICD-10-CM

## 2021-12-16 DIAGNOSIS — E663 Overweight: Secondary | ICD-10-CM

## 2021-12-16 DIAGNOSIS — E785 Hyperlipidemia, unspecified: Secondary | ICD-10-CM | POA: Diagnosis not present

## 2021-12-16 LAB — CMP14+EGFR
ALT: 20 IU/L (ref 0–44)
AST: 19 IU/L (ref 0–40)
Albumin/Globulin Ratio: 1.7 (ref 1.2–2.2)
Albumin: 4.4 g/dL (ref 3.9–4.9)
Alkaline Phosphatase: 72 IU/L (ref 44–121)
BUN/Creatinine Ratio: 18 (ref 10–24)
BUN: 21 mg/dL (ref 8–27)
Bilirubin Total: 0.3 mg/dL (ref 0.0–1.2)
CO2: 27 mmol/L (ref 20–29)
Calcium: 9.7 mg/dL (ref 8.6–10.2)
Chloride: 100 mmol/L (ref 96–106)
Creatinine, Ser: 1.2 mg/dL (ref 0.76–1.27)
Globulin, Total: 2.6 g/dL (ref 1.5–4.5)
Glucose: 116 mg/dL — ABNORMAL HIGH (ref 70–99)
Potassium: 4.5 mmol/L (ref 3.5–5.2)
Sodium: 139 mmol/L (ref 134–144)
Total Protein: 7 g/dL (ref 6.0–8.5)
eGFR: 66 mL/min/{1.73_m2} (ref >59)

## 2021-12-16 LAB — BAYER DCA HB A1C WAIVED: HB A1C (BAYER DCA - WAIVED): 9 % — ABNORMAL HIGH (ref 4.8–5.6)

## 2021-12-16 NOTE — Progress Notes (Signed)
Subjective:    Patient ID: Ryan Mcintyre, male    DOB: 1955-06-25, 66 y.o.   MRN: [id number]  Chief Complaint  Patient presents with   Follow-up   Diabetes    Wants A1c    Pt presents to the office today for chronic follow up. His A1C was elevated to 9.0. We started him on Jardiance, this was changed to [place] and he was able to get this free.    He has COPD and quit smoking 04/2020. Denies any SOB. Reports he smoked for 57 years. States he is riding his bike and hiking.    Complaining of having a hard time maintaining an erection. Requesting Viagra today.  Diabetes He presents for his follow-up diabetic visit. He has type 2 diabetes mellitus. Pertinent negatives for diabetes include no blurred vision and no foot paresthesias. Symptoms are stable. Pertinent negatives for diabetic complications include no CVA, heart disease, nephropathy or peripheral neuropathy. Risk factors for coronary artery disease include dyslipidemia, diabetes mellitus, male sex, hypertension and sedentary lifestyle. He is following a generally healthy diet. His overall blood glucose range is 130-140 mg/dl. Eye exam is current.  Gastroesophageal Reflux He complains of belching and heartburn. He reports no globus sensation. This is a chronic problem. The current episode started more than 1 year ago. The problem occurs occasionally. He has tried a PPI (as needed) for the symptoms. The treatment provided moderate relief.  Hyperlipidemia This is a chronic problem. The current episode started more than 1 year ago. Current antihyperlipidemic treatment includes statins. The current treatment provides moderate improvement of lipids. Risk factors for coronary artery disease include dyslipidemia, male sex, hypertension and a sedentary lifestyle.      Review of Systems  Eyes:  Negative for blurred vision.  Gastrointestinal:  Positive for heartburn.  All other systems reviewed and are negative.      Objective:    Physical Exam Vitals reviewed.  Constitutional:      General: He is not in acute distress.    Appearance: He is well-developed.  HENT:     Head: Normocephalic.     Right Ear: Tympanic membrane normal.     Left Ear: Tympanic membrane normal.  Eyes:     General:        Right eye: No discharge.        Left eye: No discharge.     Pupils: Pupils are equal, round, and reactive to light.  Neck:     Thyroid: No thyromegaly.  Cardiovascular:     Rate and Rhythm: Normal rate and regular rhythm.     Heart sounds: Normal heart sounds. No murmur heard. Pulmonary:     Effort: Pulmonary effort is normal. No respiratory distress.     Breath sounds: Normal breath sounds. No wheezing.  Abdominal:     General: Bowel sounds are normal. There is no distension.     Palpations: Abdomen is soft.     Tenderness: There is no abdominal tenderness.  Musculoskeletal:        General: No tenderness. Normal range of motion.     Cervical back: Normal range of motion and neck supple.  Skin:    General: Skin is warm and dry.     Findings: No erythema or rash.  Neurological:     Mental Status: He is alert and oriented to person, place, and time.     Cranial Nerves: No cranial nerve deficit.     Deep Tendon Reflexes: Reflexes are normal and  symmetric.  Psychiatric:        Behavior: Behavior normal.        Thought Content: Thought content normal.        Judgment: Judgment normal.          BP 132/81   Pulse 87   Temp 97.8 F (36.6 C) (Temporal)   Ht $R'5\' 6"'BL$  (1.676 m)   Wt 175 lb 9.6 oz (79.7 kg)   BMI 28.34 kg/m   Assessment & Plan:  Ryan Mcintyre comes in today with chief complaint of Follow-up and Diabetes (Wants A1c )   Diagnosis and orders addressed:  1. Simple chronic bronchitis ([place]) - CMP14+EGFR  2. Gastroesophageal reflux disease, unspecified whether esophagitis present - CMP14+EGFR  3. Type 2 diabetes mellitus with other specified complication, without long-term current use of  insulin (HCC) - Bayer DCA Hb A1c Waived - CMP14+EGFR  4. History of smoking greater than 50 pack years - CMP14+EGFR  5. Hyperlipidemia associated with type 2 diabetes mellitus (HCC) - CMP14+EGFR  6. Overweight (BMI 25.0-29.9) - CMP14+EGFR  7. Former smoker  - [place] pending Health Maintenance reviewed Diet and exercise encouraged  Follow up plan: 2 month and keep follow up with Almyra Free for education   Ryan Mcintyre, [place]

## 2021-12-16 NOTE — Patient Instructions (Signed)

## 2021-12-19 ENCOUNTER — Other Ambulatory Visit: Payer: Self-pay | Admitting: Family

## 2021-12-27 NOTE — Patient Instructions (Signed)
Visit Information  Following are the goals we discussed today:  Current Barriers:  Unable to independently afford treatment regimen Unable to achieve control of T2DM  Suboptimal therapeutic regimen for T2DM  Pharmacist Clinical Goal(s):  patient will verbalize ability to afford treatment regimen maintain control of T2DM as evidenced by IMPROVED GLYCEMIC, CONTROL--A1C<7%  through collaboration with PharmD and provider.    Interventions: 1:1 collaboration with Sharion Balloon, FNP regarding development and update of comprehensive plan of care as evidenced by provider attestation and co-signature Inter-disciplinary care team collaboration (see longitudinal plan of care) Comprehensive medication review performed; medication list updated in electronic medical record  Diabetes: New goal. Uncontrolled-A1C 9%, GFR 66; current treatment:METFORMIN, GLIMEPIRIDE, NEW START FARXIGA TODAY;  Current glucose readings: fasting glucose: N/A, post prandial glucose: N/A Current exercise: N/A Educated on RESTART DIABETES MEDICATIONS, ADDING FARXIGA, [place] patient finances. WILL ENROLL IN [place]&ME PATIENT ASSISTANCE   Patient Goals/Self-Care Activities patient will:  - take medications as prescribed as evidenced by patient report and record review check glucose FASTING/DAILY, document, and provide at future appointments collaborate with provider on medication access solutions target a minimum of 150 minutes of moderate intensity exercise weekly engage in dietary modifications by  FOLLOWING A HEART HEALTHY DIET/HEALTHY PLATE METHOD   Plan: Telephone follow up appointment with care management team member scheduled for:  01/2022  Regina Eck, PharmD, BCPS Clinical Pharmacist, [place]  II Phone [phone]   Please call the care guide team at [phone] if you need to cancel or reschedule your  appointment.   The patient verbalized understanding of instructions, educational materials, and care plan provided today and DECLINED offer to receive copy of patient instructions, educational materials, and care plan.

## 2022-01-03 DIAGNOSIS — E119 Type 2 diabetes mellitus without complications: Secondary | ICD-10-CM

## 2022-01-04 ENCOUNTER — Ambulatory Visit (INDEPENDENT_AMBULATORY_CARE_PROVIDER_SITE_OTHER): Payer: No Typology Code available for payment source | Admitting: Pharmacist

## 2022-01-04 DIAGNOSIS — E1169 Type 2 diabetes mellitus with other specified complication: Secondary | ICD-10-CM

## 2022-01-04 NOTE — Patient Instructions (Signed)
Visit Information  Following are the goals we discussed today:  Current Barriers:  Unable to independently afford treatment regimen Unable to achieve control of T2DM  Suboptimal therapeutic regimen for T2DM  Pharmacist Clinical Goal(s):  patient will verbalize ability to afford treatment regimen maintain control of T2DM as evidenced by IMPROVED GLYCEMIC, CONTROL--A1C<7%  through collaboration with PharmD and provider.    Interventions: 1:1 collaboration with Sharion Balloon, FNP regarding development and update of comprehensive plan of care as evidenced by provider attestation and co-signature Inter-disciplinary care team collaboration (see longitudinal plan of care) Comprehensive medication review performed; medication list updated in electronic medical record  Diabetes: Goal on Track (progressing): YES. Uncontrolled-A1C 9%, GFR 66; current treatment: METFORMIN, GLIMEPIRIDE, NEW START FARXIGA TODAY;  Patient now has all medications and is taking them His FBG was 148 this morning--much improved [place] is coming via mail patient assistance Current glucose readings: fasting glucose: N/A, post prandial glucose: N/A Current exercise: N/A Educated on RESTART DIABETES MEDICATIONS, ADDING FARXIGA, [place] patient finances. WILL ENROLL IN [place] PATIENT ASSISTANCE  Hyperlipidemia -rosuvastatin was restarted--patient complains of feeling fatigued and potential myopathy  Will cut in half and address at follow up  -Lipid Panel     Component Value Date/Time   CHOL 166 10/13/2021 1233   TRIG 383 (H) 10/13/2021 1233   HDL 29 (L) 10/13/2021 1233   CHOLHDL 5.7 (H) 10/13/2021 1233   LDLCALC 76 10/13/2021 1233   LDLDIRECT 83 12/30/2014 0903   LABVLDL 61 (H) 10/13/2021 1233    Patient Goals/Self-Care Activities patient will:  - take medications as prescribed as evidenced by patient report and record review check glucose FASTING/DAILY,  document, and provide at future appointments collaborate with provider on medication access solutions target a minimum of 150 minutes of moderate intensity exercise weekly engage in dietary modifications by  FOLLOWING A HEART HEALTHY DIET/HEALTHY PLATE METHOD   Plan: Telephone follow up appointment with care management team member scheduled for:  3 months  Signature Regina Eck, PharmD, BCPS Clinical Pharmacist, [place]  II Phone [phone]   Please call the care guide team at [phone] if you need to cancel or reschedule your appointment.   The patient verbalized understanding of instructions, educational materials, and care plan provided today and DECLINED offer to receive copy of patient instructions, educational materials, and care plan.

## 2022-01-04 NOTE — Progress Notes (Signed)
Chronic Care Management Pharmacy Note  01/04/2022 Name:  Ryan Mcintyre MRN:  [id number] DOB:  07-21-1955  Summary:   Diabetes: Goal on Track (progressing): YES. Uncontrolled-A1C 9%, GFR 66; current treatment: METFORMIN, GLIMEPIRIDE, NEW START FARXIGA TODAY;  Patient now has all medications and is taking them His FBG was 148 this morning--much improved Wilder Glade is coming via mail patient assistance Current glucose readings: fasting glucose: N/A, post prandial glucose: N/A Current exercise: N/A Educated on RESTART DIABETES MEDICATIONS, ADDING FARXIGA, [place] patient finances. WILL ENROLL IN [place] PATIENT ASSISTANCE  Hyperlipidemia -rosuvastatin was restarted--patient complains of feeling fatigued and potential myopathy  Will cut in half and address at follow up  -Lipid Panel     Component Value Date/Time   CHOL 166 10/13/2021 1233   TRIG 383 (H) 10/13/2021 1233   HDL 29 (L) 10/13/2021 1233   CHOLHDL 5.7 (H) 10/13/2021 1233   LDLCALC 76 10/13/2021 1233   LDLDIRECT 83 12/30/2014 0903   LABVLDL 61 (H) 10/13/2021 1233    Patient Goals/Self-Care Activities patient will:  - take medications as prescribed as evidenced by patient report and record review check glucose FASTING/DAILY, document, and provide at future appointments collaborate with provider on medication access solutions target a minimum of 150 minutes of moderate intensity exercise weekly engage in dietary modifications by  FOLLOWING A HEART HEALTHY DIET/HEALTHY PLATE METHOD   Subjective: Ryan Mcintyre is an 66 y.o. year old male who is a primary patient of Sharion Balloon, FNP.  The patient was referred to the Chronic Care Management team for assistance with care management needs subsequent to provider initiation of CCM services and plan of care.    Engaged with patient by telephone for follow up visit in response to provider referral for CCM services.    Objective:  LABS:   Lab Results  Component Value Date   CREATININE 1.20 12/16/2021   CREATININE 1.00 10/13/2021   CREATININE 1.04 06/07/2017     Lab Results  Component Value Date   HGBA1C 9.0 (H) 12/16/2021         Component Value Date/Time   CHOL 166 10/13/2021 1233   TRIG 383 (H) 10/13/2021 1233   HDL 29 (L) 10/13/2021 1233   CHOLHDL 5.7 (H) 10/13/2021 1233   LDLCALC 76 10/13/2021 1233   LDLDIRECT 83 12/30/2014 0903     Clinical ASCVD: No   The 10-year ASCVD risk score (Arnett DK, et al., 2019) is: 41.8%   Values used to calculate the score:     Age: 66 years     Sex: Male     Is Non-Hispanic African American: No     Diabetic: Yes     Tobacco smoker: Yes     Systolic Blood Pressure: 295 mmHg     Is BP treated: No     HDL Cholesterol: 29 mg/dL     Total Cholesterol: 166 mg/dL    Other: (CHADS2VASc if Afib, PHQ9 if depression, MMRC or CAT for COPD, ACT, DEXA)    BP Readings from Last 3 Encounters:  12/16/21 132/81  10/13/21 139/81  09/16/20 135/80      SDOH:  (Social Determinants of Health) assessments and interventions performed:    No Known Allergies  Medications Reviewed Today     Reviewed by Lavera Guise, [facility] (Pharmacist) on 01/04/22 at [place] Status: <None>   Medication Order Taking? Sig Documenting Provider Last Dose Status Informant  blood glucose meter kit  and supplies [id number] No Dispense based on patient and insurance preference. Use up to four times daily as directed. (FOR ICD-10 E10.9, E11.9). Sharion Balloon, FNP Taking Active   dapagliflozin propanediol (FARXIGA) 10 MG TABS tablet [id number] No Take 1 tablet (10 mg total) by mouth daily before breakfast. Sharion Balloon, FNP Taking Active   glimepiride (AMARYL) 4 MG tablet [id number] No Take 1 tablet (4 mg total) by mouth daily before breakfast. Sharion Balloon, FNP Taking Active   glucose blood test strip [id number] No Test daily. Sharion Balloon, FNP Taking Active    metFORMIN (GLUCOPHAGE) 1000 MG tablet [id number] No Take 1 tablet (1,000 mg total) by mouth 2 (two) times daily with a meal. Sharion Balloon, FNP Taking Active   OMEPRAZOLE PO [id number] No Take by mouth. [provider] Taking Active   rosuvastatin (CRESTOR) 10 MG tablet [id number] No Take 1 tablet (10 mg total) by mouth daily. Sharion Balloon, FNP Taking Active   sildenafil (VIAGRA) 100 MG tablet [id number] No Take 0.5-1 tablets (50-100 mg total) by mouth daily as needed for erectile dysfunction. Sharion Balloon, FNP Taking Active               Goals Addressed               This Visit's Progress     Patient Stated     T2DM, HLD (pt-stated)        Current Barriers:  Unable to independently afford treatment regimen Unable to achieve control of T2DM  Suboptimal therapeutic regimen for T2DM  Pharmacist Clinical Goal(s):  patient will verbalize ability to afford treatment regimen maintain control of T2DM as evidenced by IMPROVED GLYCEMIC, CONTROL--A1C<7%  through collaboration with PharmD and provider.    Interventions: 1:1 collaboration with Sharion Balloon, FNP regarding development and update of comprehensive plan of care as evidenced by provider attestation and co-signature Inter-disciplinary care team collaboration (see longitudinal plan of care) Comprehensive medication review performed; medication list updated in electronic medical record  Diabetes: Goal on Track (progressing): YES. Uncontrolled-A1C 9%, GFR 66; current treatment: METFORMIN, GLIMEPIRIDE, NEW START FARXIGA TODAY;  Patient now has all medications and is taking them His FBG was 148 this morning--much improved Wilder [place]ade is coming via mail patient assistance Current glucose readings: fasting glucose: N/A, post prandial glucose: N/A Current exercise: N/A Educated on RESTART DIABETES MEDICATIONS, ADDING FARXIGA, [place] patient finances. WILL ENROLL IN  [place] PATIENT ASSISTANCE  Hyperlipidemia -rosuvastatin was restarted--patient complains of feeling fatigued and potential myopathy  Will cut in half and address at follow up  -Lipid Panel     Component Value Date/Time   CHOL 166 10/13/2021 1233   TRIG 383 (H) 10/13/2021 1233   HDL 29 (L) 10/13/2021 1233   CHOLHDL 5.7 (H) 10/13/2021 1233   LDLCALC 76 10/13/2021 1233   LDLDIRECT 83 12/30/2014 0903   LABVLDL 61 (H) 10/13/2021 1233   Patient Goals/Self-Care Activities patient will:  - take medications as prescribed as evidenced by patient report and record review check glucose FASTING/DAILY, document, and provide at future appointments collaborate with provider on medication access solutions target a minimum of 150 minutes of moderate intensity exercise weekly engage in dietary modifications by  FOLLOWING A HEART HEALTHY DIET/HEALTHY PLATE METHOD         Plan: Telephone follow up appointment with care management team member scheduled for:  03/2022      Regina Eck, PharmD, BCPS Clinical Pharmacist,  [place]  II Phone [phone]

## 2022-01-16 ENCOUNTER — Other Ambulatory Visit: Payer: Self-pay | Admitting: Family Medicine

## 2022-01-16 DIAGNOSIS — E1169 Type 2 diabetes mellitus with other specified complication: Secondary | ICD-10-CM

## 2022-01-18 ENCOUNTER — Telehealth: Payer: Self-pay

## 2022-01-18 NOTE — Progress Notes (Signed)
  Chronic Care Management   Note  01/18/2022 Name: Ryan Mcintyre MRN: [id number] DOB: 04-Oct-1955  Ryan Mcintyre is a 66 y.o. year old male who is a primary care patient of Ryan Spencer, Ryan Mcintyre. I reached out to Ryan Mcintyre by phone today in response to a referral sent by Ryan Mcintyre's PCP.  Ryan Mcintyre was not successfully contacted today. A HIPAA compliant voice message was left requesting a return call.   Follow up plan: Additional outreach attempts will be made.  Penne Lash, RMA Care Guide [facility]  [place], [place] [address] Direct Dial: [phone] [email or web address]

## 2022-01-20 ENCOUNTER — Encounter: Payer: Self-pay | Admitting: Family

## 2022-01-20 ENCOUNTER — Telehealth: Payer: Self-pay | Admitting: *Deleted

## 2022-01-20 ENCOUNTER — Other Ambulatory Visit: Payer: Self-pay | Admitting: Family

## 2022-01-20 ENCOUNTER — Ambulatory Visit (INDEPENDENT_AMBULATORY_CARE_PROVIDER_SITE_OTHER): Payer: No Typology Code available for payment source | Admitting: Family

## 2022-01-20 VITALS — BP 133/75 | HR 95 | Temp 97.8°F | Ht 66.0 in | Wt 173.6 lb

## 2022-01-20 DIAGNOSIS — Z87891 Personal history of nicotine dependence: Secondary | ICD-10-CM | POA: Diagnosis not present

## 2022-01-20 DIAGNOSIS — E785 Hyperlipidemia, unspecified: Secondary | ICD-10-CM

## 2022-01-20 DIAGNOSIS — E663 Overweight: Secondary | ICD-10-CM | POA: Diagnosis not present

## 2022-01-20 DIAGNOSIS — J410 Simple chronic bronchitis: Secondary | ICD-10-CM

## 2022-01-20 DIAGNOSIS — E1169 Type 2 diabetes mellitus with other specified complication: Secondary | ICD-10-CM | POA: Diagnosis not present

## 2022-01-20 DIAGNOSIS — K219 Gastro-esophageal reflux disease without esophagitis: Secondary | ICD-10-CM | POA: Diagnosis not present

## 2022-01-20 LAB — BAYER DCA HB A1C WAIVED: HB A1C (BAYER DCA - WAIVED): 8.4 % — ABNORMAL HIGH (ref 4.8–5.6)

## 2022-01-20 MED ORDER — ROSUVASTATIN CALCIUM 5 MG PO TABS: 5.0000 mg | ORAL_TABLET | Freq: Every day | ORAL | 3 refills | Status: DC

## 2022-01-20 NOTE — Telephone Encounter (Signed)
   CCM RN Visit Note   01/20/22 Name: Ryan Mcintyre MRN: [id number]      DOB: 1955/06/23  Subjective: Ryan Mcintyre is a 66 y.o. year old male who is a primary care patient of Jannifer Rodney FNP The patient was referred to the Chronic Care Management team for assistance with care management needs subsequent to provider initiation of CCM services and plan of care.      An unsuccessful telephone outreach was attempted today to contact the patient about Chronic Care Management needs.    Plan:The care management team will reach out to the patient again over the next 30 days.  Irving Shows RNC, BSN RN Case Manager [facility] [phone]

## 2022-01-20 NOTE — Progress Notes (Signed)
 Subjective:    Patient ID: Ryan Mcintyre, male    DOB: 02/01/1956, 66 y.o.   MRN: [id number]  Chief Complaint  Patient presents with   Diabetes   Pt presents to the office today for chronic follow up. His A1C was elevated to 9.0. We started him on Jardiance, this was changed to Farxiga and he was able to get this free.    He has COPD and quit smoking 04/2020. Denies any SOB. Reports he smoked for 57 years. States he is riding his bike and hiking.    He had ED and takes viagra as needed  He is staking Crestor 5 mg, but has myalgia on the 10 mg.  Diabetes He presents for his follow-up diabetic visit. He has type 2 diabetes mellitus. His disease course has been stable. Pertinent negatives for diabetes include no blurred vision and no foot paresthesias. Symptoms are stable. Pertinent negatives for diabetic complications include no peripheral neuropathy. Risk factors for coronary artery disease include dyslipidemia, diabetes mellitus, male sex, hypertension and sedentary lifestyle. He is following a generally unhealthy diet. His overall blood glucose range is 110-130 mg/dl. Eye exam is current.  Hyperlipidemia This is a chronic problem. The current episode started more than 1 year ago. The problem is controlled. Recent lipid tests were reviewed and are normal. Current antihyperlipidemic treatment includes statins. The current treatment provides moderate improvement of lipids. Risk factors for coronary artery disease include diabetes mellitus, dyslipidemia, male sex, hypertension and a sedentary lifestyle.  Gastroesophageal Reflux He complains of belching and heartburn. This is a chronic problem. The current episode started more than 1 year ago. The problem occurs occasionally. He has tried a PPI for the symptoms. The treatment provided significant relief.      Review of Systems  Eyes:  Negative for blurred vision.  Gastrointestinal:  Positive for heartburn.  All other systems reviewed and  are negative.      Objective:   Physical Exam Vitals reviewed.  Constitutional:      General: He is not in acute distress.    Appearance: He is well-developed.  HENT:     Head: Normocephalic.     Right Ear: Tympanic membrane normal.     Left Ear: Tympanic membrane normal.  Eyes:     General:        Right eye: No discharge.        Left eye: No discharge.     Pupils: Pupils are equal, round, and reactive to light.  Neck:     Thyroid: No thyromegaly.  Cardiovascular:     Rate and Rhythm: Normal rate and regular rhythm.     Heart sounds: Normal heart sounds. No murmur heard. Pulmonary:     Effort: Pulmonary effort is normal. No respiratory distress.     Breath sounds: Normal breath sounds. No wheezing.  Abdominal:     General: Bowel sounds are normal. There is no distension.     Palpations: Abdomen is soft.     Tenderness: There is no abdominal tenderness.  Musculoskeletal:        General: No tenderness. Normal range of motion.     Cervical back: Normal range of motion and neck supple.  Skin:    General: Skin is warm and dry.     Findings: No erythema or rash.  Neurological:     Mental Status: He is alert and oriented to person, place, and time.     Cranial Nerves: No cranial nerve deficit.       Deep Tendon Reflexes: Reflexes are normal and symmetric.  Psychiatric:        Behavior: Behavior normal.        Thought Content: Thought content normal.        Judgment: Judgment normal.      BP 133/75   Pulse 95   Temp 97.8 F (36.6 C) (Temporal)   Ht 5' 6" (1.676 m)   Wt 173 lb 9.6 oz (78.7 kg)   SpO2 95%   BMI 28.02 kg/m       Assessment & Plan:  Rufino J Mcclenny comes in today with chief complaint of Diabetes   Diagnosis and orders addressed:  1. Simple chronic bronchitis (HCC) - CMP14+EGFR  2. Gastroesophageal reflux disease, unspecified whether esophagitis present - CMP14+EGFR  3. Type 2 diabetes mellitus with other specified complication, without  long-term current use of insulin (HCC) - CMP14+EGFR - Bayer DCA Hb A1c Waived  4. Former smoker - CMP14+EGFR  5. History of smoking greater than 50 pack years - CMP14+EGFR  6. Hyperlipidemia associated with type 2 diabetes mellitus (HCC) Will decrease to 5 mg  - rosuvastatin (CRESTOR) 5 MG tablet; Take 1 tablet (5 mg total) by mouth daily.  Dispense: 90 tablet; Refill: 3 - CMP14+EGFR  7. Overweight (BMI 25.0-29.9) - CMP14+EGFR   Labs pending Health Maintenance reviewed Diet and exercise encouraged  Follow up plan: 3 months    Christy Hawks, FNP    

## 2022-01-21 LAB — CMP14+EGFR
ALT: 11 IU/L (ref 0–44)
AST: 13 IU/L (ref 0–40)
Albumin/Globulin Ratio: 1.8 (ref 1.2–2.2)
Albumin: 4.6 g/dL (ref 3.9–4.9)
Alkaline Phosphatase: 90 IU/L (ref 44–121)
BUN/Creatinine Ratio: 14 (ref 10–24)
BUN: 16 mg/dL (ref 8–27)
Bilirubin Total: 0.4 mg/dL (ref 0.0–1.2)
CO2: 26 mmol/L (ref 20–29)
Calcium: 9.6 mg/dL (ref 8.6–10.2)
Chloride: 103 mmol/L (ref 96–106)
Creatinine, Ser: 1.17 mg/dL (ref 0.76–1.27)
Globulin, Total: 2.5 g/dL (ref 1.5–4.5)
Glucose: 105 mg/dL — ABNORMAL HIGH (ref 70–99)
Potassium: 4.7 mmol/L (ref 3.5–5.2)
Sodium: 145 mmol/L — ABNORMAL HIGH (ref 134–144)
Total Protein: 7.1 g/dL (ref 6.0–8.5)
eGFR: 68 mL/min/{1.73_m2} (ref >59)

## 2022-01-23 ENCOUNTER — Other Ambulatory Visit: Payer: Self-pay | Admitting: Family

## 2022-01-23 DIAGNOSIS — E1169 Type 2 diabetes mellitus with other specified complication: Secondary | ICD-10-CM

## 2022-01-23 MED ORDER — SEMAGLUTIDE(0.25 OR 0.5MG/DOS) 2 MG/3ML ~~LOC~~ SOPN: 0.2500 mg | PEN_INJECTOR | SUBCUTANEOUS | 2 refills | Status: DC

## 2022-01-23 MED ORDER — SEMAGLUTIDE(0.25 OR 0.5MG/DOS) 2 MG/3ML ~~LOC~~ SOPN: 0.5000 mg | PEN_INJECTOR | SUBCUTANEOUS | 2 refills | Status: DC

## 2022-01-30 NOTE — Progress Notes (Signed)
  Chronic Care Management   Note  01/30/2022 Name: Ryan Mcintyre MRN: [id number] DOB: 12/26/1955  Ryan Mcintyre is a 66 y.o. year old male who is a primary care patient of Junie Spencer, FNP. I reached out to Hermine Messick by phone today in response to a referral sent by Ryan Mcintyre's PCP.  Ryan Mcintyre  agreedto scheduling an appointment with the CCM RN Case Manager   Follow up plan: Patient agreed to scheduled appointment with RN Case Manager on 02/17/2022(date/time).   Penne Lash, RMA Care Guide [facility]  [place], [place] [address] Direct Dial: [phone] [email or web address]

## 2022-02-02 DIAGNOSIS — E1169 Type 2 diabetes mellitus with other specified complication: Secondary | ICD-10-CM

## 2022-02-02 DIAGNOSIS — E785 Hyperlipidemia, unspecified: Secondary | ICD-10-CM | POA: Diagnosis not present

## 2022-02-17 ENCOUNTER — Telehealth: Payer: Self-pay | Admitting: *Deleted

## 2022-02-17 ENCOUNTER — Telehealth: Payer: No Typology Code available for payment source

## 2022-02-17 NOTE — Telephone Encounter (Signed)
   CCM RN Visit Note   02/17/22 Name: Ryan Mcintyre MRN: [id number]      DOB: 05-28-1955  Subjective: Ryan Mcintyre is a 66 y.o. year old male who is a primary care patient of Jannifer Rodney FNP. The patient was referred to the Chronic Care Management team for assistance with care management needs subsequent to provider initiation of CCM services and plan of care.      An unsuccessful telephone outreach was attempted today to contact the patient about Chronic Care Management needs.    Plan:Telephone follow up appointment with care management team member scheduled for:  upon care guide rescheduling.  Irving Shows RNC, BSN RN Case Manager [facility] [phone]

## 2022-02-23 ENCOUNTER — Telehealth: Payer: Self-pay

## 2022-02-23 NOTE — Progress Notes (Signed)
  Chronic Care Management Note  02/23/2022 Name: Ryan Mcintyre MRN: [id number] DOB: [birth year]  Ryan Mcintyre is a 66 y.o. year old male who is a primary care patient of Junie Spencer, FNP and is actively engaged with the care management team. I reached out to Hermine Messick by phone today to assist with re-scheduling an initial visit with the RN Case Manager  Follow up plan: Unsuccessful telephone outreach attempt made. A HIPAA compliant phone message was left for the patient providing contact information and requesting a return call.  The care management team will reach out to the patient again over the next 7 days.  If patient returns call to provider office, please advise to call CCM Care Guide Penne Lash  at [phone]  Penne Lash, RMA Care Guide [facility]  [place], [place] [address] Direct Dial: [phone] [email or web address]

## 2022-03-14 ENCOUNTER — Ambulatory Visit: Payer: No Typology Code available for payment source | Admitting: Pharmacist

## 2022-03-14 VITALS — BP 125/75 | HR 83

## 2022-03-14 DIAGNOSIS — E1169 Type 2 diabetes mellitus with other specified complication: Secondary | ICD-10-CM

## 2022-03-14 DIAGNOSIS — G720 Drug-induced myopathy: Secondary | ICD-10-CM

## 2022-03-14 NOTE — Progress Notes (Signed)
Chronic Care Management Pharmacy Note  03/14/2022 Name:  Ryan Mcintyre MRN:  [id number] DOB:  06/24/1955  Summary:  Diabetes: Goal on Track (progressing): YES. Uncontrolled-A1C 9-->8.4%, GFR 66-->68; current treatment: METFORMIN, GLIMEPIRIDE, FARXIGA, new start ozempic;  FBG 120-125--improved!! CUT SODAS  ABLE TO RUN  RESTART OZEMPIC 0.25MG  weekly then increase to 0.5mg  weekly Denies personal and family history of Medullary thyroid cancer (MTC) Current glucose readings: fasting glucose: N/A, post prandial glucose: N/A Current exercise: jogging now Educated on RESTART DIABETES MEDICATIONS, ADDING FARXIGA, PLEASE CONTINUE CHECKING FASTING BLOOD SUGAR Assessed patient finances. WILL ENROLL IN [place] PATIENT ASSISTANCE; may need novo nordisk patient assistance  Hyperlipidemia -rosuvastatin was restarted--patient complains of feeling fatigued and potential myopathy  Will cut in half and address at follow up  G72 statin myopathy  -Lipid Panel     Component Value Date/Time   CHOL 166 10/13/2021 1233   TRIG 383 (H) 10/13/2021 1233   HDL 29 (L) 10/13/2021 1233   CHOLHDL 5.7 (H) 10/13/2021 1233   LDLCALC 76 10/13/2021 1233   LDLDIRECT 83 12/30/2014 0903   LABVLDL 61 (H) 10/13/2021 1233    Patient Goals/Self-Care Activities patient will:  - take medications as prescribed as evidenced by patient report and record review check glucose FASTING/DAILY, document, and provide at future appointments collaborate with provider on medication access solutions target a minimum of 150 minutes of moderate intensity exercise weekly engage in dietary modifications by  FOLLOWING A HEART HEALTHY DIET/HEALTHY PLATE METHOD   Subjective: Ryan Mcintyre is an 67 y.o. year old male who is a primary patient of Junie Spencer, FNP.  The patient was referred to the Chronic Care Management team for assistance with care management needs subsequent to provider initiation of CCM services and plan of care.     Engaged with patient by telephone for follow up visit in response to provider referral for CCM services.   Objective:  LABS:    Lab Results  Component Value Date   CREATININE 1.17 01/20/2022   CREATININE 1.20 12/16/2021   CREATININE 1.00 10/13/2021     Lab Results  Component Value Date   HGBA1C 8.4 (H) 01/20/2022         Component Value Date/Time   CHOL 166 10/13/2021 1233   TRIG 383 (H) 10/13/2021 1233   HDL 29 (L) 10/13/2021 1233   CHOLHDL 5.7 (H) 10/13/2021 1233   LDLCALC 76 10/13/2021 1233   LDLDIRECT 83 12/30/2014 0903     Clinical ASCVD: No   The 10-year ASCVD risk score (Arnett DK, et al., 2019) is: 42.2%   Values used to calculate the score:     Age: 70 years     Sex: Male     Is Non-Hispanic African American: No     Diabetic: Yes     Tobacco smoker: Yes     Systolic Blood Pressure: 133 mmHg     Is BP treated: No     HDL Cholesterol: 29 mg/dL     Total Cholesterol: 166 mg/dL    Other: ([id number] if Afib, PHQ9 if depression, MMRC or CAT for COPD, ACT, DEXA)    BP Readings from Last 3 Encounters:  01/20/22 133/75  12/16/21 132/81  10/13/21 139/81      SDOH:  (Social Determinants of Health) assessments and interventions performed:    No Known Allergies  Medications Reviewed Today     Reviewed by Danella Maiers, [facility] (Pharmacist) on 03/22/22 at 1326  Med List Status: <None>  Medication Order Taking? Sig Documenting Provider Last Dose Status Informant  blood glucose meter kit and supplies [id number] No Dispense based on patient and insurance preference. Use up to four times daily as directed. (FOR ICD-10 E10.9, E11.9). Junie Spencer, FNP Taking Active   dapagliflozin propanediol (FARXIGA) 10 MG TABS tablet [id number] No Take 1 tablet (10 mg total) by mouth daily before breakfast. Junie Spencer, FNP Taking Active   glimepiride (AMARYL) 4 MG tablet [id number] No Take 1 tablet (4 mg total) by mouth daily before breakfast. Junie Spencer, FNP  Taking Active   glucose blood test strip [id number] No Test daily. Junie Spencer, FNP Taking Active   metFORMIN (GLUCOPHAGE) 1000 MG tablet [id number] No Take 1 tablet (1,000 mg total) by mouth 2 (two) times daily with a meal. Junie Spencer, FNP Taking Active   OMEPRAZOLE PO [id number] No Take by mouth. [provider] Taking Active   rosuvastatin (CRESTOR) 5 MG tablet [id number] No Take 1 tablet (5 mg total) by mouth daily. Junie Spencer, FNP Taking Active     Discontinued 03/14/22 1118 (Change in therapy)   Semaglutide,0.25 or 0.5MG /DOS, 2 MG/3ML SOPN [id number]  Inject 0.5 mg into the skin once a week. Jannifer Rodney A, FNP  Active   sildenafil (VIAGRA) 100 MG tablet [id number] No Take 0.5-1 tablets (50-100 mg total) by mouth daily as needed for erectile dysfunction. Junie Spencer, FNP Taking Active               Goals Addressed               This Visit's Progress     Patient Stated     T2DM, HLD (pt-stated)        Current Barriers:  Unable to independently afford treatment regimen Unable to achieve control of T2DM  Suboptimal therapeutic regimen for T2DM  Pharmacist Clinical Goal(s):  patient will verbalize ability to afford treatment regimen maintain control of T2DM as evidenced by IMPROVED GLYCEMIC, CONTROL--A1C<7%  through collaboration with PharmD and provider.    Interventions: 1:1 collaboration with Junie Spencer, FNP regarding development and update of comprehensive plan of care as evidenced by provider attestation and co-signature Inter-disciplinary care team collaboration (see longitudinal plan of care) Comprehensive medication review performed; medication list updated in electronic medical record  Diabetes: Goal on Track (progressing): YES. Uncontrolled-A1C 9-->8.4%, GFR 66-->68; current treatment: METFORMIN, GLIMEPIRIDE, FARXIGA, new start ozempic;  FBG 120-125--improved!! CUT SODAS  ABLE TO RUN  RESTART OZEMPIC 0.25MG  weekly then  increase to 0.5mg  weekly Denies personal and family history of Medullary thyroid cancer (MTC) Current glucose readings: fasting glucose: N/A, post prandial glucose: N/A Current exercise: jogging now Educated on RESTART DIABETES MEDICATIONS, ADDING FARXIGA, PLEASE CONTINUE CHECKING FASTING BLOOD SUGAR Assessed patient finances. WILL ENROLL IN [place] PATIENT ASSISTANCE; may need novo nordisk patient assistance  Hyperlipidemia -rosuvastatin was restarted--patient complains of feeling fatigued and potential myopathy  Will cut in half and address at follow up  G72 statin myopathy  -Lipid Panel     Component Value Date/Time   CHOL 166 10/13/2021 1233   TRIG 383 (H) 10/13/2021 1233   HDL 29 (L) 10/13/2021 1233   CHOLHDL 5.7 (H) 10/13/2021 1233   LDLCALC 76 10/13/2021 1233   LDLDIRECT 83 12/30/2014 0903   LABVLDL 61 (H) 10/13/2021 1233   Patient Goals/Self-Care Activities patient will:  - take medications as prescribed as evidenced by patient report and record review check glucose FASTING/DAILY, document, and provide at future  appointments collaborate with provider on medication access solutions target a minimum of 150 minutes of moderate intensity exercise weekly engage in dietary modifications by  FOLLOWING A HEART HEALTHY DIET/HEALTHY PLATE METHOD         Plan: Telephone follow up appointment with care management team member scheduled for:  2 months    Regina Eck, PharmD, BCPS, [place] Clinical Pharmacist, [place]  II  T [phone]

## 2022-03-21 ENCOUNTER — Ambulatory Visit: Payer: No Typology Code available for payment source | Admitting: *Deleted

## 2022-03-21 DIAGNOSIS — E1169 Type 2 diabetes mellitus with other specified complication: Secondary | ICD-10-CM

## 2022-03-21 DIAGNOSIS — J410 Simple chronic bronchitis: Secondary | ICD-10-CM

## 2022-03-21 NOTE — Plan of Care (Signed)
Chronic Care Management Provider Comprehensive Care Plan    03/21/2022 Name: Ryan Mcintyre MRN: [id number] DOB: [birth year]  Referral to Chronic Care Management (CCM) services was placed by Provider:  Evelina Dun FNP on Date: 01/20/22.  Chronic Condition 1: COPD Provider Assessment and Plan Simple chronic bronchitis ([place]) - CMP14+EGFR   Expected Outcome/Goals Addressed This Visit (Provider CCM goals/Provider Assessment and plan  CCM (COPD) EXPECTED OUTCOME: MONITOR, SELF-MANAGE AND REDUCE SYMPTOMS OF COPD  Symptom Management Condition 1: Take medications as prescribed   Attend all scheduled provider appointments Call pharmacy for medication refills 3-7 days in advance of running out of medications Attend church or other social activities Perform all self care activities independently  Perform IADL's (shopping, preparing meals, housekeeping, managing finances) independently Call provider office for new concerns or questions  identify and remove indoor air pollutants limit outdoor activity during cold weather develop a rescue plan eliminate symptom triggers at home follow rescue plan if symptoms flare-up keep follow-up appointments: primary care provider 04/03/22 at 1040 am eat healthy/prescribed diet: heart healthy, carbohydrate modified get at least 7 to 8 hours of sleep at night do breathing exercises every day COPD action plan sent via my chart  Chronic Condition 2: DIABETES Provider Assessment and Plan Type 2 diabetes mellitus with other specified complication, without long-term current use of insulin (HCC) - CMP14+EGFR - [employer] DCA Hb A1c Waived   Expected Outcome/Goals Addressed This Visit (Provider CCM goals/Provider Assessment and plan  CCM (DIABETES) EXPECTED OUTCOME:  MONITOR, SELF-MANAGE AND REDUCE SYMPTOMS OF DIABETES  Symptom Management Condition 2: Take medications as prescribed   Attend all scheduled provider appointments Call pharmacy for medication refills  3-7 days in advance of running out of medications Attend church or other social activities Perform all self care activities independently  Perform IADL's (shopping, preparing meals, housekeeping, managing finances) independently Call provider office for new concerns or questions  check blood sugar at prescribed times: once daily check feet daily for cuts, sores or redness enter blood sugar readings and medication or insulin into daily log take the blood sugar log to all doctor visits take the blood sugar meter to all doctor visits trim toenails straight across fill half of plate with vegetables limit fast food meals to no more than 1 per week manage portion size read food labels for fat, fiber, carbohydrates and portion size keep feet up while sitting wash and dry feet carefully every day wear comfortable, cotton socks Look over education via my chart- hypoglycemia  Problem List Patient Active Problem List   Diagnosis Date Noted   History of smoking greater than 50 pack years 12/16/2021   Hepatic steatosis 02/21/2017   COPD (chronic obstructive pulmonary disease) ([place]) 02/21/2017   Overweight (BMI 25.0-29.9) 04/11/2016   Former smoker 04/11/2016   Hyperlipidemia associated with type 2 diabetes mellitus ([place]) 09/30/2014   Diabetes mellitus, type 2 ([place]) 09/28/2014   GERD (gastroesophageal reflux disease) 09/28/2014    Medication Management  Current Outpatient Medications:    blood glucose meter kit and supplies, Dispense based on patient and insurance preference. Use up to four times daily as directed. (FOR ICD-10 E10.9, E11.9)., Disp: 1 each, Rfl: 0   dapagliflozin propanediol (FARXIGA) 10 MG TABS tablet, Take 1 tablet (10 mg total) by mouth daily before breakfast., Disp: 90 tablet, Rfl: 6   glimepiride (AMARYL) 4 MG tablet, Take 1 tablet (4 mg total) by mouth daily before breakfast., Disp: 90 tablet, Rfl: 1   glucose blood test strip, Test  daily., Disp: 100 each, Rfl: 12    metFORMIN (GLUCOPHAGE) 1000 MG tablet, Take 1 tablet (1,000 mg total) by mouth 2 (two) times daily with a meal., Disp: 180 tablet, Rfl: 3   OMEPRAZOLE PO, Take by mouth., Disp: , Rfl:    rosuvastatin (CRESTOR) 5 MG tablet, Take 1 tablet (5 mg total) by mouth daily., Disp: 90 tablet, Rfl: 3   sildenafil (VIAGRA) 100 MG tablet, Take 0.5-1 tablets (50-100 mg total) by mouth daily as needed for erectile dysfunction., Disp: 30 tablet, Rfl: 1   Semaglutide,0.25 or 0.5MG /DOS, 2 MG/3ML SOPN, Inject 0.25 mg into the skin once a week. (Patient not taking: Reported on 03/21/2022), Disp: 3 mL, Rfl: 2  Cognitive Assessment Identity Confirmed: : Name; DOB Cognitive Status: Normal   Functional Assessment Hearing Difficulty or Deaf: no Wear Glasses or Blind: yes Vision Management: can see well w/ glasses Concentrating, Remembering or Making Decisions Difficulty (CP): no Difficulty Communicating: no Difficulty Eating/Swallowing: no Walking or Climbing Stairs Difficulty: no Dressing/Bathing Difficulty: no Doing Errands Independently Difficulty (such as shopping) (CP): no   Caregiver Assessment  Primary Source of Support/Comfort: spouse Name of Support/Comfort Primary Source: Renee Harder People in Home: spouse Name(s) of People in Home: Qasim Diveley-  spouse   Planned Interventions  Provided education to patient about basic DM disease process; Reviewed prescribed diet with patient carbohydrate modified; Counseled on importance of regular laboratory monitoring as prescribed;        Discussed plans with patient for ongoing care management follow up and provided patient with direct contact information for care management team;      Provided patient with written educational materials related to hypo and hyperglycemia and importance of correct treatment;       Advised patient, providing education and rationale, to check cbg once daily and record        call provider for findings outside established  parameters;       Review of patient status, including review of consultants reports, relevant laboratory and other test results, and medications completed;       Screening for signs and symptoms of depression related to chronic disease state;        Assessed social determinant of health barriers;        In basket message sent to primary care provider reporting patient is not taking semaglutide because of side effects of nausea and vomiting Provided patient with basic written and verbal COPD education on self care/management/and exacerbation prevention Provided written and verbal instructions on pursed lip breathing and utilized returned demonstration as teach back Provided instruction about proper use of medications used for management of COPD including inhalers Advised patient to self assesses COPD action plan zone and make appointment with provider if in the yellow zone for 48 hours without improvement Advised patient to engage in light exercise as tolerated 3-5 days a week to aid in the the management of COPD Discussed the importance of adequate rest and management of fatigue with COPD Screening for signs and symptoms of depression related to chronic disease state  Assessed social determinant of health barriers Advanced directives packet mailed  Interaction and coordination with outside resources, practitioners, and providers See CCM Referral  Care Plan: Available in MyChart

## 2022-03-21 NOTE — Chronic Care Management (AMB) (Signed)
Chronic Care Management   CCM RN Visit Note  03/21/2022 Name: Ryan Mcintyre MRN: [id number] DOB: 07-02-1955  Subjective: Ryan Mcintyre is a 67 y.o. year old male who is a primary care patient of Sharion Balloon, FNP. The patient was referred to the Chronic Care Management team for assistance with care management needs subsequent to provider initiation of CCM services and plan of care.    Today's Visit:  Engaged with patient by telephone for initial visit.     SDOH Interventions Today    Flowsheet Row Most Recent Value  SDOH Interventions   Food Insecurity Interventions Intervention Not Indicated  Housing Interventions Intervention Not Indicated  Transportation Interventions Intervention Not Indicated  Utilities Interventions Intervention Not Indicated  Financial Strain Interventions Intervention Not Indicated  Physical Activity Interventions Intervention Not Indicated  Stress Interventions Intervention Not Indicated  Social Connections Interventions Intervention Not Indicated         Goals Addressed             This Visit's Progress    CCM (COPD) EXPECTED OUTCOME: MONITOR, SELF-MANAGE AND REDUCE SYMPTOMS OF COPD       Current Barriers:  Knowledge Deficits related to COPD management Chronic Disease Management support and education needs related to COPD/ action plan No Advanced Directives in place- patient requests information be mailed Patient reports he lives with spouse, is independent in all aspects of his care, continues to drive, states " my breathing has been good"  Planned Interventions: Provided patient with basic written and verbal COPD education on self care/management/and exacerbation prevention Provided written and verbal instructions on pursed lip breathing and utilized returned demonstration as teach back Provided instruction about proper use of medications used for management of COPD including inhalers Advised patient to self assesses COPD action plan  zone and make appointment with provider if in the yellow zone for 48 hours without improvement Advised patient to engage in light exercise as tolerated 3-5 days a week to aid in the the management of COPD Discussed the importance of adequate rest and management of fatigue with COPD Screening for signs and symptoms of depression related to chronic disease state  Assessed social determinant of health barriers Advanced directives packet mailed  Symptom Management: Take medications as prescribed   Attend all scheduled provider appointments Call pharmacy for medication refills 3-7 days in advance of running out of medications Attend church or other social activities Perform all self care activities independently  Perform IADL's (shopping, preparing meals, housekeeping, managing finances) independently Call provider office for new concerns or questions  identify and remove indoor air pollutants limit outdoor activity during cold weather develop a rescue plan eliminate symptom triggers at home follow rescue plan if symptoms flare-up keep follow-up appointments: primary care provider 04/03/22 at 1040 am eat healthy/prescribed diet: heart healthy, carbohydrate modified get at least 7 to 8 hours of sleep at night do breathing exercises every day COPD action plan sent via my chart  Follow Up Plan: Telephone follow up appointment with care management team member scheduled for: 05/12/2022 at 9 am       CCM (DIABETES) EXPECTED OUTCOME:  MONITOR, SELF-MANAGE AND REDUCE SYMPTOMS OF DIABETES       Current Barriers:  Knowledge Deficits related to Diabetes management Chronic Disease Management support and education needs related to Diabetes, diet No Advanced Directives in place- information to be mailed Patient reports he checks CBG once daily with fasting 125 today with all fasting readings under 145 Patient states he  is no longer taking semaglutide due to side effects of nausea and vomiting, states  this has been discussed with primary care provider/ pharmacist but they are unaware pt has completely stopped taking  Planned Interventions: Provided education to patient about basic DM disease process; Reviewed prescribed diet with patient carbohydrate modified; Counseled on importance of regular laboratory monitoring as prescribed;        Discussed plans with patient for ongoing care management follow up and provided patient with direct contact information for care management team;      Provided patient with written educational materials related to hypo and hyperglycemia and importance of correct treatment;       Advised patient, providing education and rationale, to check cbg once daily and record        call provider for findings outside established parameters;       Review of patient status, including review of consultants reports, relevant laboratory and other test results, and medications completed;       Screening for signs and symptoms of depression related to chronic disease state;        Assessed social determinant of health barriers;        In basket message sent to primary care provider reporting patient is not taking semaglutide because of side effects of nausea and vomiting  Symptom Management: Take medications as prescribed   Attend all scheduled provider appointments Call pharmacy for medication refills 3-7 days in advance of running out of medications Attend church or other social activities Perform all self care activities independently  Perform IADL's (shopping, preparing meals, housekeeping, managing finances) independently Call provider office for new concerns or questions  check blood sugar at prescribed times: once daily check feet daily for cuts, sores or redness enter blood sugar readings and medication or insulin into daily log take the blood sugar log to all doctor visits take the blood sugar meter to all doctor visits trim toenails straight across fill half  of plate with vegetables limit fast food meals to no more than 1 per week manage portion size read food labels for fat, fiber, carbohydrates and portion size keep feet up while sitting wash and dry feet carefully every day wear comfortable, cotton socks Look over education via my chart- hypoglycemia  Follow Up Plan: Telephone follow up appointment with care management team member scheduled for: 05/12/22 at 9 am          Plan:Telephone follow up appointment with care management team member scheduled for:  05/12/22 at 9 am  Jacqlyn Larsen [facility], BSN RN Case Manager [place] [phone]

## 2022-03-21 NOTE — Patient Instructions (Signed)
Please call the care guide team at [phone] if you need to cancel or reschedule your appointment.   If you are experiencing a Mental Health or Behavioral Health Crisis or need someone to talk to, please call the Suicide and Crisis Lifeline: 988 call the [place] National Suicide Prevention Lifeline: 865 451 8808 or TTY: 803 309 0495 TTY 985-009-3737) to talk to a trained counselor call 1-800-273-TALK (toll free, 24 hour hotline) go to [facility] [address], [place] [phone]) call the [facility]: [phone] call 911   Following is a copy of the CCM Program Consent:  CCM service includes personalized support from designated clinical staff supervised by the physician, including individualized plan of care and coordination with other care providers 24/7 contact phone numbers for assistance for urgent and routine care needs. Service will only be billed when office clinical staff spend 20 minutes or more in a month to coordinate care. Only one practitioner may furnish and bill the service in a calendar month. The patient may stop CCM services at amy time (effective at the end of the month) by phone call to the office staff. The patient will be responsible for cost sharing (co-pay) or up to 20% of the service fee (after annual deductible is met)  Following is a copy of your full provider care plan:   Goals Addressed             This Visit's Progress    CCM (COPD) EXPECTED OUTCOME: MONITOR, SELF-MANAGE AND REDUCE SYMPTOMS OF COPD       Current Barriers:  Knowledge Deficits related to COPD management Chronic Disease Management support and education needs related to COPD/ action plan No Advanced Directives in place- patient requests information be mailed Patient reports he lives with spouse, is independent in all aspects of his care, continues to drive, states " my breathing has been good"  Planned Interventions: Provided  patient with basic written and verbal COPD education on self care/management/and exacerbation prevention Provided written and verbal instructions on pursed lip breathing and utilized returned demonstration as teach back Provided instruction about proper use of medications used for management of COPD including inhalers Advised patient to self assesses COPD action plan zone and make appointment with provider if in the yellow zone for 48 hours without improvement Advised patient to engage in light exercise as tolerated 3-5 days a week to aid in the the management of COPD Discussed the importance of adequate rest and management of fatigue with COPD Screening for signs and symptoms of depression related to chronic disease state  Assessed social determinant of health barriers Advanced directives packet mailed  Symptom Management: Take medications as prescribed   Attend all scheduled provider appointments Call pharmacy for medication refills 3-7 days in advance of running out of medications Attend church or other social activities Perform all self care activities independently  Perform IADL's (shopping, preparing meals, housekeeping, managing finances) independently Call provider office for new concerns or questions  identify and remove indoor air pollutants limit outdoor activity during cold weather develop a rescue plan eliminate symptom triggers at home follow rescue plan if symptoms flare-up keep follow-up appointments: primary care provider 04/03/22 at 1040 am eat healthy/prescribed diet: heart healthy, carbohydrate modified get at least 7 to 8 hours of sleep at night do breathing exercises every day COPD action plan sent via my chart  Follow Up Plan: Telephone follow up appointment with care management team member scheduled for: 05/12/2022 at 9 am  CCM (DIABETES) EXPECTED OUTCOME:  MONITOR, SELF-MANAGE AND REDUCE SYMPTOMS OF DIABETES       Current Barriers:  Knowledge Deficits  related to Diabetes management Chronic Disease Management support and education needs related to Diabetes, diet No Advanced Directives in place- information to be mailed Patient reports he checks CBG once daily with fasting 125 today with all fasting readings under 145 Patient states he is no longer taking semaglutide due to side effects of nausea and vomiting, states this has been discussed with primary care provider/ pharmacist but they are unaware pt has completely stopped taking  Planned Interventions: Provided education to patient about basic DM disease process; Reviewed prescribed diet with patient carbohydrate modified; Counseled on importance of regular laboratory monitoring as prescribed;        Discussed plans with patient for ongoing care management follow up and provided patient with direct contact information for care management team;      Provided patient with written educational materials related to hypo and hyperglycemia and importance of correct treatment;       Advised patient, providing education and rationale, to check cbg once daily and record        call provider for findings outside established parameters;       Review of patient status, including review of consultants reports, relevant laboratory and other test results, and medications completed;       Screening for signs and symptoms of depression related to chronic disease state;        Assessed social determinant of health barriers;        In basket message sent to primary care provider reporting patient is not taking semaglutide because of side effects of nausea and vomiting  Symptom Management: Take medications as prescribed   Attend all scheduled provider appointments Call pharmacy for medication refills 3-7 days in advance of running out of medications Attend church or other social activities Perform all self care activities independently  Perform IADL's (shopping, preparing meals, housekeeping, managing  finances) independently Call provider office for new concerns or questions  check blood sugar at prescribed times: once daily check feet daily for cuts, sores or redness enter blood sugar readings and medication or insulin into daily log take the blood sugar log to all doctor visits take the blood sugar meter to all doctor visits trim toenails straight across fill half of plate with vegetables limit fast food meals to no more than 1 per week manage portion size read food labels for fat, fiber, carbohydrates and portion size keep feet up while sitting wash and dry feet carefully every day wear comfortable, cotton socks Look over education via my chart- hypoglycemia  Follow Up Plan: Telephone follow up appointment with care management team member scheduled for: 05/12/22 at 9 am          Patient verbalizes understanding of instructions and care plan provided today and agrees to view in MyChart. Active MyChart status and patient understanding of how to access instructions and care plan via MyChart confirmed with patient.     Telephone follow up appointment with care management team member scheduled for: 05/12/22 at 9 am  COPD Action Plan A COPD action plan is a description of what to do when you have a flare (exacerbation) of chronic obstructive pulmonary disease (COPD). Your action plan is a color-coded plan that lists the symptoms that indicate whether your condition is under control and what actions to take. If you have symptoms in the green zone, it means you  are doing well that day. If you have symptoms in the yellow zone, it means you are having a bad day or an exacerbation. If you have symptoms in the red zone, you need urgent medical care. Follow the plan that you and your health care provider developed. Review your plan with your health care provider at each visit. Red zone Symptoms in this zone mean that you should get medical help right away. They include: Feeling very short of  breath, even when you are resting. Not being able to do any activities because of poor breathing. Not being able to sleep because of poor breathing. Fever or shaking chills. Feeling confused or very sleepy. Chest pain. Coughing up blood. If you have any of these symptoms, call emergency services (911 in the [place] or go to the nearest emergency room. Yellow zone Symptoms in this zone mean that your condition may be getting worse. They include: Feeling more short of breath than usual. Having less energy for daily activities than usual. Phlegm or mucus that is thicker than usual. Needing to use your rescue inhaler or nebulizer more often than usual. More ankle swelling than usual. Coughing more than usual. Feeling like you have a chest cold. Trouble sleeping due to COPD symptoms. Decreased appetite. COPD medicines not helping as much as usual. If you experience any "yellow" symptoms: Keep taking your daily medicines as directed. Use your quick-relief inhaler as told by your health care provider. If you were prescribed steroid medicine to take by mouth (oral medicine), start taking it as told by your health care provider. If you were prescribed an antibiotic medicine, start taking it as told by your health care provider. Do not stop taking the antibiotic even if you start to feel better. Use oxygen as told by your health care provider. Get more rest. Do your pursed-lip breathing exercises. Do not smoke. Avoid any irritants in the air. If your signs and symptoms do not improve after taking these steps, call your health care provider right away. Green zone Symptoms in this zone mean that you are doing well. They include: Being able to do your usual activities and exercise. Having the usual amount of coughing, including the same amount of phlegm or mucus. Being able to sleep well. Having a good appetite. Where to find more information: You can find more information about COPD  from: American Lung Association, My COPD Action Plan: www.lung.org COPD Foundation: www.copdfoundation.org National Heart, Lung, & Blood Institute: [employer].is Follow these instructions at home: Continue taking your daily medicines as told by your health care provider. Make sure you receive all the immunizations that your health care provider recommends, especially the pneumococcal and influenza vaccines. Wash your hands often with soap and water. Have family members wash their hands too. Regular hand washing can help prevent infections. Follow your usual exercise and diet plan. Avoid irritants in the air, such as smoke. Do not use any products that contain nicotine or tobacco. These products include cigarettes, chewing tobacco, and vaping devices, such as e-cigarettes. If you need help quitting, ask your health care provider. Summary A COPD action plan tells you what to do when you have a flare (exacerbation) of chronic obstructive pulmonary disease (COPD). Follow each action plan for your symptoms. If you have any symptoms in the red zone, call emergency services (911 in the [place] or go to the nearest emergency room. This information is not intended to replace advice given to you by your health care provider. Make sure you  discuss any questions you have with your health care provider. Document Revised: 12/30/2019 Document Reviewed: 12/30/2019 Elsevier Patient Education  [place]. Hypoglycemia Hypoglycemia is when the sugar (glucose) level in your blood is too low. Low blood sugar can happen to people who have diabetes and people who do not have diabetes. Low blood sugar can happen quickly, and it can be an emergency. What are the causes? This condition happens most often in people who have diabetes. It may be caused by: Diabetes medicine. Not eating enough, or not eating often enough. Doing more physical activity. Drinking alcohol on an empty stomach. If you do not have  diabetes, this condition may be caused by: A tumor in the pancreas. Not eating enough, or not eating for long periods at a time (fasting). A very bad infection or illness. Problems after having weight loss (bariatric) surgery. Kidney failure or liver failure. Certain medicines. What increases the risk? This condition is more likely to develop in people who: Have diabetes and take medicines to lower their blood sugar. Abuse alcohol. Have a very bad illness. What are the signs or symptoms? Mild Hunger. Sweating and feeling clammy. Feeling dizzy or light-headed. Being sleepy or having trouble sleeping. Feeling like you may vomit (nauseous). A fast heartbeat. A headache. Blurry vision. Mood changes, such as: Being grouchy. Feeling worried or nervous (anxious). Tingling or loss of feeling (numbness) around your mouth, lips, or tongue. Moderate Confusion and poor judgment. Behavior changes. Weakness. Uneven heartbeat. Trouble with moving (coordination). Very low Very low blood sugar (severe hypoglycemia) is a medical emergency. It can cause: Fainting. Seizures. Loss of consciousness (coma). Death. How is this treated? Treating low blood sugar Low blood sugar is often treated by eating or drinking something that has sugar in it right away. The food or drink should contain 15 grams of a fast-acting carb (carbohydrate). Options include: 4 oz (120 mL) of fruit juice. 4 oz (120 mL) of regular soda (not diet soda). A few pieces of hard candy. Check food labels to see how many pieces to eat for 15 grams. 1 Tbsp (15 mL) of sugar or honey. 4 glucose tablets. 1 tube of glucose gel. Treating low blood sugar if you have diabetes If you can think clearly and swallow safely, follow the 15:15 rule: Take 15 grams of a fast-acting carb. Talk with your doctor about how much you should take. Always keep a source of fast-acting carb with you, such as: Glucose tablets (take 4 tablets). A few  pieces of hard candy. Check food labels to see how many pieces to eat for 15 grams. 4 oz (120 mL) of fruit juice. 4 oz (120 mL) of regular soda (not diet soda). 1 Tbsp (15 mL) of honey or sugar. 1 tube of glucose gel. Check your blood sugar 15 minutes after you take the carb. If your blood sugar is still at or below 70 mg/dL (3.9 mmol/L), take 15 grams of a carb again. If your blood sugar does not go above 70 mg/dL (3.9 mmol/L) after 3 tries, get help right away. After your blood sugar goes back to normal, eat a meal or a snack within 1 hour.  Treating very low blood sugar If your blood sugar is below 54 mg/dL (3 mmol/L), you have very low blood sugar, or severe hypoglycemia. This is an emergency. Get medical help right away. If you have very low blood sugar and you cannot eat or drink, you will need to be given a hormone called  glucagon. A family member or friend should learn how to check your blood sugar and how to give you glucagon. Ask your doctor if you need to have an emergency glucagon kit at home. Very low blood sugar may also need to be treated in a hospital. Follow these instructions at home: General instructions Take over-the-counter and prescription medicines only as told by your doctor. Stay aware of your blood sugar as told by your doctor. If you drink alcohol: Limit how much you have to: 0-1 drink a day for women who are not pregnant. 0-2 drinks a day for men. Know how much alcohol is in your drink. In the [place] one drink equals one 12 oz bottle of beer (355 mL), one 5 oz glass of wine (148 mL), or one 1 oz glass of hard liquor (44 mL). Be sure to eat food when you drink alcohol. Know that your body absorbs alcohol quickly. This may lead to low blood sugar later. Be sure to keep checking your blood sugar. Keep all follow-up visits. If you have diabetes:  Always have a fast-acting carb (15 grams) with you to treat low blood sugar. Follow your diabetes care plan as told by  your doctor. Make sure you: Know the symptoms of low blood sugar. Check your blood sugar as often as told. Always check it before and after exercise. Always check your blood sugar before you drive. Take your medicines as told. Follow your meal plan. Eat on time. Do not skip meals. Share your diabetes care plan with: Your work or school. People you live with. Carry a card or wear jewelry that says you have diabetes. Where to find more information American Diabetes Association: www.diabetes.org Contact a doctor if: You have trouble keeping your blood sugar in your target range. You have low blood sugar often. Get help right away if: You still have symptoms after you eat or drink something that contains 15 grams of fast-acting carb, and you cannot get your blood sugar above 70 mg/dL by following the 15:15 rule. Your blood sugar is below 54 mg/dL (3 mmol/L). You have a seizure. You faint. These symptoms may be an emergency. Get help right away. Call your local emergency services (911 in the [place] Do not wait to see if the symptoms will go away. Do not drive yourself to the hospital. Summary Hypoglycemia happens when the level of sugar (glucose) in your blood is too low. Low blood sugar can happen to people who have diabetes and people who do not have diabetes. Low blood sugar can happen quickly, and it can be an emergency. Make sure you know the symptoms of low blood sugar and know how to treat it. Always keep a source of sugar (fast-acting carb) with you to treat low blood sugar. This information is not intended to replace advice given to you by your health care provider. Make sure you discuss any questions you have with your health care provider. Document Revised: 01/22/2020 Document Reviewed: 01/22/2020 Elsevier Patient Education  [place].

## 2022-03-22 ENCOUNTER — Encounter: Payer: Self-pay | Admitting: Pharmacist

## 2022-03-22 MED ORDER — SEMAGLUTIDE(0.25 OR 0.5MG/DOS) 2 MG/3ML ~~LOC~~ SOPN: 0.5000 mg | PEN_INJECTOR | SUBCUTANEOUS | 2 refills | Status: DC

## 2022-03-22 NOTE — Patient Instructions (Signed)
Visit Information  Following are the goals we discussed today:  Current Barriers:  Unable to independently afford treatment regimen Unable to achieve control of T2DM  Suboptimal therapeutic regimen for T2DM  Pharmacist Clinical Goal(s):  patient will verbalize ability to afford treatment regimen maintain control of T2DM as evidenced by IMPROVED GLYCEMIC, CONTROL--A1C<7%  through collaboration with PharmD and provider.    Interventions: 1:1 collaboration with Sharion Balloon, FNP regarding development and update of comprehensive plan of care as evidenced by provider attestation and co-signature Inter-disciplinary care team collaboration (see longitudinal plan of care) Comprehensive medication review performed; medication list updated in electronic medical record  Diabetes: Goal on Track (progressing): YES. Uncontrolled-A1C 9-->8.4%, GFR 66-->68; current treatment: METFORMIN, GLIMEPIRIDE, FARXIGA, new start ozempic;  FBG 120-125--improved!! CUT SODAS  ABLE TO RUN  RESTART OZEMPIC 0.25MG  weekly then increase to 0.5mg  weekly Denies personal and family history of Medullary thyroid cancer (MTC) Current glucose readings: fasting glucose: N/A, post prandial glucose: N/A Current exercise: jogging now Educated on RESTART DIABETES MEDICATIONS, ADDING FARXIGA, [place] BLOOD SUGAR Assessed patient finances. WILL E[place]OLL IN [place] PATIENT ASSISTANCE; may need novo nordisk patient assistance  Hyperlipidemia -rosuvastatin was restarted--patient complains of feeling fatigued and potential myopathy  Will cut in half and address at follow up  G72 statin myopathy  -Lipid Panel     Component Value Date/Time   CHOL 166 10/13/2021 1233   TRIG 383 (H) 10/13/2021 1233   HDL 29 (L) 10/13/2021 1233   CHOLHDL 5.7 (H) 10/13/2021 1233   LDLCALC 76 10/13/2021 1233   LDLDIRECT 83 12/30/2014 0903   LABVLDL 61 (H) 10/13/2021 1233    Patient Goals/Self-Care Activities patient  will:  - take medications as prescribed as evidenced by patient report and record review check glucose FASTING/DAILY, document, and provide at future appointments collaborate with provider on medication access solutions target a minimum of 150 minutes of moderate intensity exercise weekly engage in dietary modifications by  FOLLOWING A HEART HEALTHY DIET/HEALTHY PLATE METHOD   Plan: Telephone follow up appointment with care management team member scheduled for:  2 months  Signature Regina Eck, PharmD, BCPS, [place] Clinical Pharmacist, [place]  [facility]  T [phone]   Please call the care guide team at [phone] if you need to cancel or reschedule your appointment.   The patient verbalized understanding of instructions, educational materials, and care plan provided today and DECLINED offer to receive copy of patient instructions, educational materials, and care plan.

## 2022-03-24 ENCOUNTER — Other Ambulatory Visit: Payer: Self-pay | Admitting: Family

## 2022-03-24 MED ORDER — INSULIN GLARGINE (2 UNIT DIAL) 300 UNIT/ML ~~LOC~~ SOPN: 8.0000 [IU] | PEN_INJECTOR | Freq: Every evening | SUBCUTANEOUS | 1 refills | Status: DC

## 2022-03-24 NOTE — Progress Notes (Signed)
lmtcb

## 2022-03-24 NOTE — Progress Notes (Signed)
Toujeo 8 units sent to pharmacy. Keep chronic follow up.

## 2022-04-03 ENCOUNTER — Ambulatory Visit: Payer: No Typology Code available for payment source | Admitting: Family

## 2022-04-03 ENCOUNTER — Encounter: Payer: Self-pay | Admitting: Family

## 2022-04-05 DIAGNOSIS — J449 Chronic obstructive pulmonary disease, unspecified: Secondary | ICD-10-CM

## 2022-04-05 DIAGNOSIS — E1159 Type 2 diabetes mellitus with other circulatory complications: Secondary | ICD-10-CM | POA: Diagnosis not present

## 2022-04-25 ENCOUNTER — Ambulatory Visit: Payer: No Typology Code available for payment source | Admitting: Family

## 2022-05-04 ENCOUNTER — Ambulatory Visit: Payer: No Typology Code available for payment source | Admitting: Pharmacist

## 2022-05-04 DIAGNOSIS — E1169 Type 2 diabetes mellitus with other specified complication: Secondary | ICD-10-CM

## 2022-05-12 ENCOUNTER — Ambulatory Visit: Payer: No Typology Code available for payment source | Admitting: *Deleted

## 2022-05-12 DIAGNOSIS — J410 Simple chronic bronchitis: Secondary | ICD-10-CM

## 2022-05-12 DIAGNOSIS — E1169 Type 2 diabetes mellitus with other specified complication: Secondary | ICD-10-CM

## 2022-05-12 NOTE — Patient Instructions (Signed)
Please call the care guide team at [phone] if you need to cancel or reschedule your appointment.   If you are experiencing a Mental Health or [place] or need someone to talk to, please call the Suicide and Crisis Lifeline: 988 call the [place] National Suicide Prevention Lifeline: 517-685-8542 or TTY: (254)035-1274 TTY (408) 645-2710) to talk to a trained counselor call 1-800-273-TALK (toll free, 24 hour hotline) go to [facility] [address], [place] [phone]) call the [facility]: [phone] call 911   Following is a copy of the CCM Program Consent:  CCM service includes personalized support from designated clinical staff supervised by the physician, including individualized plan of care and coordination with other care providers 24/7 contact phone numbers for assistance for urgent and routine care needs. Service will only be billed when office clinical staff spend 20 minutes or more in a month to coordinate care. Only one practitioner may furnish and bill the service in a calendar month. The patient may stop CCM services at amy time (effective at the end of the month) by phone call to the office staff. The patient will be responsible for cost sharing (co-pay) or up to 20% of the service fee (after annual deductible is met)  Following is a copy of your full provider care plan:   Goals Addressed             This Visit's Progress    CCM (COPD) EXPECTED OUTCOME: MONITOR, SELF-MANAGE AND REDUCE SYMPTOMS OF COPD       Current Barriers:  Knowledge Deficits related to COPD management Chronic Disease Management support and education needs related to COPD/ action plan No Advanced Directives in place- information previously mailed Patient reports he lives with spouse, is independent in all aspects of his care, continues to drive, states " my breathing has been good", continues to get outdoors and exercise,  rides bike  Planned Interventions: Provided instruction about proper use of medications used for management of COPD including inhalers Advised patient to self assesses COPD action plan zone and make appointment with provider if in the yellow zone for 48 hours without improvement Advised patient to engage in light exercise as tolerated 3-5 days a week to aid in the the management of COPD Provided education about and advised patient to utilize infection prevention strategies to reduce risk of respiratory infection Discussed the importance of adequate rest and management of fatigue with COPD Reviewed upcoming scheduled appointments  Symptom Management: Take medications as prescribed   Attend all scheduled provider appointments Call pharmacy for medication refills 3-7 days in advance of running out of medications Attend church or other social activities Perform all self care activities independently  Perform IADL's (shopping, preparing meals, housekeeping, managing finances) independently Call provider office for new concerns or questions  identify and remove indoor air pollutants limit outdoor activity during cold weather listen for public air quality announcements every day develop a rescue plan eliminate symptom triggers at home follow rescue plan if symptoms flare-up keep follow-up appointments: primary care provider 04/03/22 at 1040 am eat healthy/prescribed diet: heart healthy, carbohydrate modified get at least 7 to 8 hours of sleep at night do breathing exercises every day Follow COPD action- call your doctor early on for change in health status, symptoms Follow up with primary care provider on 05/16/22 at 825 am  Follow Up Plan: Telephone follow up appointment with care management team member scheduled for:   08/10/22 at 130 pm  CCM (DIABETES) EXPECTED OUTCOME:  MONITOR, SELF-MANAGE AND REDUCE SYMPTOMS OF DIABETES       Current Barriers:  Knowledge Deficits related to  Diabetes management Chronic Disease Management support and education needs related to Diabetes, diet No Advanced Directives in place- information previously mailed Patient reports he checks CBG once daily fasting with all readings under 150, states will continue to work towards goal of [place] <7  Planned Interventions: Counseled on importance of regular laboratory monitoring as prescribed;        Discussed plans with patient for ongoing care management follow up and provided patient with direct contact information for care management team;      Provided patient with written educational materials related to hypo and hyperglycemia and importance of correct treatment;       Advised patient, providing education and rationale, to check cbg once daily and record        call provider for findings outside established parameters;       Review of patient status, including review of consultants reports, relevant laboratory and other test results, and medications completed;       Reinforced carbohydrate modified diet and nutritious food choices  Symptom Management: Take medications as prescribed   Attend all scheduled provider appointments Call pharmacy for medication refills 3-7 days in advance of running out of medications Attend church or other social activities Perform all self care activities independently  Perform IADL's (shopping, preparing meals, housekeeping, managing finances) independently Call provider office for new concerns or questions  check blood sugar at prescribed times: once daily check feet daily for cuts, sores or redness enter blood sugar readings and medication or insulin into daily log take the blood sugar log to all doctor visits take the blood sugar meter to all doctor visits trim toenails straight across fill half of plate with vegetables limit fast food meals to no more than 1 per week manage portion size read food labels for fat, fiber, carbohydrates and portion  size set a realistic goal keep feet up while sitting wash and dry feet carefully every day wear comfortable, cotton socks Follow carbohydrate modified diet  Follow Up Plan: Telephone follow up appointment with care management team member scheduled for:   08/10/22 at 130 pm         Patient verbalizes understanding of instructions and care plan provided today and agrees to view in [place]. Active MyChart status and patient understanding of how to access instructions and care plan via MyChart confirmed with patient.     Telephone follow up appointment with care management team member scheduled for:  08/10/22 at 130 pm

## 2022-05-12 NOTE — Progress Notes (Signed)
     05/04/22 Name: Ryan Mcintyre MRN: [id number] DOB: 1955/06/23   Unsuccessful outreach attempt to patient.  Patient encouraged to call back if pharmacy needs arise.   Regina Eck, PharmD, [place] Clinical Pharmacist, [facility]

## 2022-05-12 NOTE — Chronic Care Management (AMB) (Signed)
Chronic Care Management   CCM RN Visit Note  05/12/2022 Name: Ryan Mcintyre MRN: [id number] DOB: 1955/04/07  Subjective: Ryan Mcintyre is a 67 y.o. year old male who is a primary care patient of Sharion Balloon, FNP. The patient was referred to the Chronic Care Management team for assistance with care management needs subsequent to provider initiation of CCM services and plan of care.    Today's Visit:  Engaged with patient by telephone for follow up visit.        Goals Addressed             This Visit's Progress    CCM (COPD) EXPECTED OUTCOME: MONITOR, SELF-MANAGE AND REDUCE SYMPTOMS OF COPD       Current Barriers:  Knowledge Deficits related to COPD management Chronic Disease Management support and education needs related to COPD/ action plan No Advanced Directives in place- information previously mailed Patient reports he lives with spouse, is independent in all aspects of his care, continues to drive, states " my breathing has been good", continues to get outdoors and exercise, rides bike  Planned Interventions: Provided instruction about proper use of medications used for management of COPD including inhalers Advised patient to self assesses COPD action plan zone and make appointment with provider if in the yellow zone for 48 hours without improvement Advised patient to engage in light exercise as tolerated 3-5 days a week to aid in the the management of COPD Provided education about and advised patient to utilize infection prevention strategies to reduce risk of respiratory infection Discussed the importance of adequate rest and management of fatigue with COPD Reviewed upcoming scheduled appointments  Symptom Management: Take medications as prescribed   Attend all scheduled provider appointments Call pharmacy for medication refills 3-7 days in advance of running out of medications Attend church or other social activities Perform all self care activities independently   Perform IADL's (shopping, preparing meals, housekeeping, managing finances) independently Call provider office for new concerns or questions  identify and remove indoor air pollutants limit outdoor activity during cold weather listen for public air quality announcements every day develop a rescue plan eliminate symptom triggers at home follow rescue plan if symptoms flare-up keep follow-up appointments: primary care provider 04/03/22 at 1040 am eat healthy/prescribed diet: heart healthy, carbohydrate modified get at least 7 to 8 hours of sleep at night do breathing exercises every day Follow COPD action- call your doctor early on for change in health status, symptoms Follow up with primary care provider on 05/16/22 at 825 am  Follow Up Plan: Telephone follow up appointment with care management team member scheduled for:   08/10/22 at 130 pm       CCM (DIABETES) EXPECTED OUTCOME:  MONITOR, SELF-MANAGE AND REDUCE SYMPTOMS OF DIABETES       Current Barriers:  Knowledge Deficits related to Diabetes management Chronic Disease Management support and education needs related to Diabetes, diet No Advanced Directives in place- information previously mailed Patient reports he checks CBG once daily fasting with all readings under 150, states will continue to work towards goal of [facility] <7  Planned Interventions: Counseled on importance of regular laboratory monitoring as prescribed;        Discussed plans with patient for ongoing care management follow up and provided patient with direct contact information for care management team;      Provided patient with written educational materials related to hypo and hyperglycemia and importance of correct treatment;  Advised patient, providing education and rationale, to check cbg once daily and record        call provider for findings outside established parameters;       Review of patient status, including review of consultants reports, relevant  laboratory and other test results, and medications completed;       Reinforced carbohydrate modified diet and nutritious food choices  Symptom Management: Take medications as prescribed   Attend all scheduled provider appointments Call pharmacy for medication refills 3-7 days in advance of running out of medications Attend church or other social activities Perform all self care activities independently  Perform IADL's (shopping, preparing meals, housekeeping, managing finances) independently Call provider office for new concerns or questions  check blood sugar at prescribed times: once daily check feet daily for cuts, sores or redness enter blood sugar readings and medication or insulin into daily log take the blood sugar log to all doctor visits take the blood sugar meter to all doctor visits trim toenails straight across fill half of plate with vegetables limit fast food meals to no more than 1 per week manage portion size read food labels for fat, fiber, carbohydrates and portion size set a realistic goal keep feet up while sitting wash and dry feet carefully every day wear comfortable, cotton socks Follow carbohydrate modified diet  Follow Up Plan: Telephone follow up appointment with care management team member scheduled for:   08/10/22 at 130 pm         Plan:Telephone follow up appointment with care management team member scheduled for:  08/10/22 at 130 pm  Jacqlyn Larsen [facility], BSN RN Case Manager [place] [phone]

## 2022-05-16 ENCOUNTER — Ambulatory Visit: Payer: No Typology Code available for payment source | Admitting: Family

## 2022-05-16 ENCOUNTER — Encounter: Payer: Self-pay | Admitting: Family

## 2022-05-16 VITALS — BP 137/81 | HR 79 | Temp 98.0°F | Ht 66.0 in | Wt 177.2 lb

## 2022-05-16 DIAGNOSIS — E785 Hyperlipidemia, unspecified: Secondary | ICD-10-CM

## 2022-05-16 DIAGNOSIS — Z87891 Personal history of nicotine dependence: Secondary | ICD-10-CM

## 2022-05-16 DIAGNOSIS — J410 Simple chronic bronchitis: Secondary | ICD-10-CM

## 2022-05-16 DIAGNOSIS — E663 Overweight: Secondary | ICD-10-CM

## 2022-05-16 DIAGNOSIS — K760 Fatty (change of) liver, not elsewhere classified: Secondary | ICD-10-CM

## 2022-05-16 DIAGNOSIS — J4489 Other specified chronic obstructive pulmonary disease: Secondary | ICD-10-CM

## 2022-05-16 DIAGNOSIS — E1169 Type 2 diabetes mellitus with other specified complication: Secondary | ICD-10-CM | POA: Diagnosis not present

## 2022-05-16 DIAGNOSIS — K219 Gastro-esophageal reflux disease without esophagitis: Secondary | ICD-10-CM

## 2022-05-16 LAB — CMP14+EGFR
ALT: 19 IU/L (ref 0–44)
AST: 16 IU/L (ref 0–40)
Albumin/Globulin Ratio: 1.8 (ref 1.2–2.2)
Albumin: 4.4 g/dL (ref 3.9–4.9)
Alkaline Phosphatase: 83 IU/L (ref 44–121)
BUN/Creatinine Ratio: 16 (ref 10–24)
BUN: 17 mg/dL (ref 8–27)
Bilirubin Total: 0.3 mg/dL (ref 0.0–1.2)
CO2: 27 mmol/L (ref 20–29)
Calcium: 9.5 mg/dL (ref 8.6–10.2)
Chloride: 102 mmol/L (ref 96–106)
Creatinine, Ser: 1.05 mg/dL (ref 0.76–1.27)
Globulin, Total: 2.5 g/dL (ref 1.5–4.5)
Glucose: 227 mg/dL — ABNORMAL HIGH (ref 70–99)
Potassium: 4.7 mmol/L (ref 3.5–5.2)
Sodium: 142 mmol/L (ref 134–144)
Total Protein: 6.9 g/dL (ref 6.0–8.5)
eGFR: 77 mL/min/{1.73_m2} (ref >59)

## 2022-05-16 LAB — BAYER DCA HB A1C WAIVED: HB A1C (BAYER DCA - WAIVED): 7.3 % — ABNORMAL HIGH (ref 4.8–5.6)

## 2022-05-16 NOTE — Patient Instructions (Signed)

## 2022-05-16 NOTE — Progress Notes (Signed)
Subjective:    Patient ID: Ryan Mcintyre, male    DOB: [birth year], 67 y.o.   MRN: [id number]  Chief Complaint  Patient presents with   Medical Management of Chronic Issues   Pt presents to the office today for chronic follow up. His A1C was elevated to 8.4. We started him on Jardiance, this was changed to [place] and he was able to get this free.    He has COPD and quit smoking nine months ago.  Denies any SOB. Reports he smoked for 57 years. States he is riding his bike and hiking.    He had ED and takes viagra as needed   He is staking Crestor 5 mg, but has myalgia on the 10 mg.  Gastroesophageal Reflux He complains of belching, heartburn and a hoarse voice. This is a chronic problem. The current episode started more than 1 year ago. The problem occurs occasionally. The problem has been waxing and waning. Risk factors include smoking/tobacco exposure. He has tried a PPI for the symptoms. The treatment provided moderate relief.  Hyperlipidemia This is a chronic problem. The current episode started more than 1 year ago. The problem is controlled. Recent lipid tests were reviewed and are normal. Associated symptoms include myalgias. Current antihyperlipidemic treatment includes statins. The current treatment provides moderate improvement of lipids. Risk factors for coronary artery disease include dyslipidemia, diabetes mellitus, hypertension, male sex and a sedentary lifestyle.  Diabetes He presents for his follow-up diabetic visit. He has type 2 diabetes mellitus. Pertinent negatives for diabetes include no blurred vision and no foot paresthesias. Symptoms are stable. Risk factors for coronary artery disease include dyslipidemia, diabetes mellitus, hypertension, male sex, sedentary lifestyle and post-menopausal. His overall blood glucose range is 110-130 mg/dl.      Review of Systems  HENT:  Positive for hoarse voice.   Eyes:  Negative for blurred vision.  Gastrointestinal:  Positive  for heartburn.  Musculoskeletal:  Positive for myalgias.  All other systems reviewed and are negative.      Objective:   Physical Exam Vitals reviewed.  Constitutional:      General: He is not in acute distress.    Appearance: He is well-developed.  HENT:     Head: Normocephalic.     Right Ear: Tympanic membrane normal.     Left Ear: Tympanic membrane normal.  Eyes:     General:        Right eye: No discharge.        Left eye: No discharge.     Pupils: Pupils are equal, round, and reactive to light.  Neck:     Thyroid: No thyromegaly.  Cardiovascular:     Rate and Rhythm: Normal rate and regular rhythm.     Heart sounds: Normal heart sounds. No murmur heard. Pulmonary:     Effort: Pulmonary effort is normal. No respiratory distress.     Breath sounds: Rhonchi present. No wheezing.  Abdominal:     General: Bowel sounds are normal. There is no distension.     Palpations: Abdomen is soft.     Tenderness: There is no abdominal tenderness.  Musculoskeletal:        General: No tenderness. Normal range of motion.     Cervical back: Normal range of motion and neck supple.  Skin:    General: Skin is warm and dry.     Findings: No erythema or rash.  Neurological:     Mental Status: He is alert and oriented to person,  place, and time.     Cranial Nerves: No cranial nerve deficit.     Deep Tendon Reflexes: Reflexes are normal and symmetric.  Psychiatric:        Behavior: Behavior normal.        Thought Content: Thought content normal.        Judgment: Judgment normal.          BP 137/81   Pulse 79   Temp 98 F (36.7 C) (Temporal)   Ht '5\' 6"'$  (1.676 m)   Wt 177 lb 3.2 oz (80.4 kg)   SpO2 96%   BMI 28.60 kg/m   Assessment & Plan:  TROWA KINDRICK comes in today with chief complaint of Medical Management of Chronic Issues   Diagnosis and orders addressed:  1. Type 2 diabetes mellitus with other specified complication, without long-term current use of insulin  (HCC) - CMP14+EGFR - Bayer DCA Hb A1c Waived  2. Gastroesophageal reflux disease, unspecified whether esophagitis present - CMP14+EGFR  3. History of smoking greater than 50 pack years - CT CHEST LUNG CA SCREEN LOW DOSE W/O CM; Future - CMP14+EGFR  4. Simple chronic bronchitis (HCC) - CT CHEST LUNG CA SCREEN LOW DOSE W/O CM; Future - CMP14+EGFR  5. Hepatic steatosis - CMP14+EGFR  6. Overweight (BMI 25.0-29.9) - CMP14+EGFR  7. Hyperlipidemia associated with type 2 diabetes mellitus (HCC) - CMP14+EGFR  8. Former smoker - CT CHEST LUNG CA SCREEN LOW DOSE W/O CM; Future - CMP14+EGFR   Labs pending Health Maintenance reviewed Diet and exercise encouraged  Follow up plan: 3 months   Evelina Dun, FNP

## 2022-05-19 ENCOUNTER — Ambulatory Visit (HOSPITAL_BASED_OUTPATIENT_CLINIC_OR_DEPARTMENT_OTHER)
Admission: RE | Admit: 2022-05-19 | Discharge: 2022-05-19 | Disposition: A | Discharge status: Home / Self Care | Payer: No Typology Code available for payment source | Source: Ambulatory Visit | Attending: Family | Admitting: Family

## 2022-05-19 DIAGNOSIS — J410 Simple chronic bronchitis: Secondary | ICD-10-CM | POA: Diagnosis not present

## 2022-05-19 DIAGNOSIS — Z87891 Personal history of nicotine dependence: Secondary | ICD-10-CM | POA: Insufficient documentation

## 2022-05-23 ENCOUNTER — Other Ambulatory Visit: Payer: Self-pay | Admitting: Family

## 2022-05-23 DIAGNOSIS — I700 Atherosclerosis of aorta: Secondary | ICD-10-CM | POA: Insufficient documentation

## 2022-05-29 DIAGNOSIS — Z7984 Long term (current) use of oral hypoglycemic drugs: Secondary | ICD-10-CM | POA: Diagnosis not present

## 2022-05-29 DIAGNOSIS — E119 Type 2 diabetes mellitus without complications: Secondary | ICD-10-CM | POA: Diagnosis not present

## 2022-05-29 DIAGNOSIS — R1110 Vomiting, unspecified: Secondary | ICD-10-CM | POA: Diagnosis not present

## 2022-05-29 DIAGNOSIS — M6281 Muscle weakness (generalized): Secondary | ICD-10-CM | POA: Diagnosis not present

## 2022-05-29 DIAGNOSIS — U071 COVID-19: Secondary | ICD-10-CM | POA: Diagnosis not present

## 2022-05-29 DIAGNOSIS — R0600 Dyspnea, unspecified: Secondary | ICD-10-CM | POA: Diagnosis not present

## 2022-05-29 DIAGNOSIS — F17210 Nicotine dependence, cigarettes, uncomplicated: Secondary | ICD-10-CM | POA: Diagnosis not present

## 2022-05-29 DIAGNOSIS — J441 Chronic obstructive pulmonary disease with (acute) exacerbation: Secondary | ICD-10-CM | POA: Diagnosis not present

## 2022-06-04 DIAGNOSIS — J449 Chronic obstructive pulmonary disease, unspecified: Secondary | ICD-10-CM

## 2022-06-04 DIAGNOSIS — E1159 Type 2 diabetes mellitus with other circulatory complications: Secondary | ICD-10-CM | POA: Diagnosis not present

## 2022-06-04 DIAGNOSIS — Z794 Long term (current) use of insulin: Secondary | ICD-10-CM

## 2022-08-10 ENCOUNTER — Ambulatory Visit: Payer: No Typology Code available for payment source | Admitting: *Deleted

## 2022-08-10 DIAGNOSIS — E1169 Type 2 diabetes mellitus with other specified complication: Secondary | ICD-10-CM

## 2022-08-10 DIAGNOSIS — J410 Simple chronic bronchitis: Secondary | ICD-10-CM

## 2022-08-10 NOTE — Chronic Care Management (AMB) (Signed)
Chronic Care Management   CCM RN Visit Note  08/10/2022 Name: Ryan Mcintyre MRN: [id number] DOB: November 14, 1955  Subjective: Ryan Mcintyre is a 67 y.o. year old male who is a primary care patient of Junie Spencer, FNP. The patient was referred to the Chronic Care Management team for assistance with care management needs subsequent to provider initiation of CCM services and plan of care.    Today's Visit:  Engaged with patient by telephone for follow up visit.        Goals Addressed             This Visit's Progress    CCM (COPD) EXPECTED OUTCOME: MONITOR, SELF-MANAGE AND REDUCE SYMPTOMS OF COPD       Current Barriers:  Knowledge Deficits related to COPD management Chronic Disease Management support and education needs related to COPD/ action plan No Advanced Directives in place- information previously mailed Patient reports he lives with spouse, is independent in all aspects of his care, continues to drive, states " breathing is ok", continues to get outdoors and exercise, rides bike daily  Planned Interventions: Advised patient to track and manage COPD triggers Provided instruction about proper use of medications used for management of COPD including inhalers Advised patient to self assesses COPD action plan zone and make appointment with provider if in the yellow zone for 48 hours without improvement Advised patient to engage in light exercise as tolerated 3-5 days a week to aid in the the management of COPD Provided education about and advised patient to utilize infection prevention strategies to reduce risk of respiratory infection Discussed the importance of adequate rest and management of fatigue with COPD Reviewed energy conservation Reviewed upcoming scheduled appointments  Symptom Management: Take medications as prescribed   Attend all scheduled provider appointments Call pharmacy for medication refills 3-7 days in advance of running out of medications Attend church  or other social activities Perform all self care activities independently  Perform IADL's (shopping, preparing meals, housekeeping, managing finances) independently Call provider office for new concerns or questions  identify and remove indoor air pollutants limit outdoor activity during cold weather listen for public air quality announcements every day develop a rescue plan eliminate symptom triggers at home follow rescue plan if symptoms flare-up keep follow-up appointments: primary care provider 04/03/22 at 1040 am don't eat or exercise right before bedtime eat healthy/prescribed diet: heart healthy, carbohydrate modified get at least 7 to 8 hours of sleep at night do breathing exercises every day Follow COPD action- call your doctor early on for change in health status, symptoms Alternate activity with rest  Follow Up Plan: Telephone follow up appointment with care management team member scheduled for:   11/03/22 at 9 am       CCM (DIABETES) EXPECTED OUTCOME:  MONITOR, SELF-MANAGE AND REDUCE SYMPTOMS OF DIABETES       Current Barriers:  Knowledge Deficits related to Diabetes management Chronic Disease Management support and education needs related to Diabetes, diet No Advanced Directives in place- information previously mailed Patient reports he checks CBG once daily fasting with all readings under 130 with today's reading 122, states will continue to work towards goal of AIC <7, happy that most recent AIC on 05/16/22 is 7.3 down from 8.4  Planned Interventions: Counseled on importance of regular laboratory monitoring as prescribed;        Discussed plans with patient for ongoing care management follow up and provided patient with direct contact information for care management team;  Provided patient with written educational materials related to hypo and hyperglycemia and importance of correct treatment;       Advised patient, providing education and rationale, to check cbg once  daily and record        call provider for findings outside established parameters;       Review of patient status, including review of consultants reports, relevant laboratory and other test results, and medications completed;       Reviewed carbohydrate modified diet and nutritious food choices  Symptom Management: Take medications as prescribed   Attend all scheduled provider appointments Call pharmacy for medication refills 3-7 days in advance of running out of medications Attend church or other social activities Perform all self care activities independently  Perform IADL's (shopping, preparing meals, housekeeping, managing finances) independently Call provider office for new concerns or questions  check blood sugar at prescribed times: once daily check feet daily for cuts, sores or redness enter blood sugar readings and medication or insulin into daily log take the blood sugar log to all doctor visits take the blood sugar meter to all doctor visits trim toenails straight across fill half of plate with vegetables limit fast food meals to no more than 1 per week manage portion size read food labels for fat, fiber, carbohydrates and portion size set a realistic goal keep feet up while sitting wash and dry feet carefully every day wear comfortable, cotton socks Follow carbohydrate modified diet Continue to exercise daily  Follow Up Plan: Telephone follow up appointment with care management team member scheduled for:    11/03/22 at 9 am        Plan:Telephone follow up appointment with care management team member scheduled for:  11/03/22 at 9 am  Irving Shows [facility], BSN RN Case Manager [facility] [phone]

## 2022-08-10 NOTE — Patient Instructions (Signed)
Please call the care guide team at [phone] if you need to cancel or reschedule your appointment.   If you are experiencing a Mental Health or Behavioral Health Crisis or need someone to talk to, please call the Suicide and Crisis Lifeline: 988 call the [place] National Suicide Prevention Lifeline: (407)481-4951 or TTY: (531) 521-5907 TTY 929-446-2425) to talk to a trained counselor call 1-800-273-TALK (toll free, 24 hour hotline) go to [facility] [address] [place] [phone]) call the [facility]: [phone] call 911   Following is a copy of the CCM Program Consent:  CCM service includes personalized support from designated clinical staff supervised by the physician, including individualized plan of care and coordination with other care providers 24/7 contact phone numbers for assistance for urgent and routine care needs. Service will only be billed when office clinical staff spend 20 minutes or more in a month to coordinate care. Only one practitioner may furnish and bill the service in a calendar month. The patient may stop CCM services at amy time (effective at the end of the month) by phone call to the office staff. The patient will be responsible for cost sharing (co-pay) or up to 20% of the service fee (after annual deductible is met)  Following is a copy of your full provider care plan:   Goals Addressed             This Visit's Progress    CCM (COPD) EXPECTED OUTCOME: MONITOR, SELF-MANAGE AND REDUCE SYMPTOMS OF COPD       Current Barriers:  Knowledge Deficits related to COPD management Chronic Disease Management support and education needs related to COPD/ action plan No Advanced Directives in place- information previously mailed Patient reports he lives with spouse, is independent in all aspects of his care, continues to drive, states " breathing is ok", continues to get outdoors and exercise, rides bike  daily  Planned Interventions: Advised patient to track and manage COPD triggers Provided instruction about proper use of medications used for management of COPD including inhalers Advised patient to self assesses COPD action plan zone and make appointment with provider if in the yellow zone for 48 hours without improvement Advised patient to engage in light exercise as tolerated 3-5 days a week to aid in the the management of COPD Provided education about and advised patient to utilize infection prevention strategies to reduce risk of respiratory infection Discussed the importance of adequate rest and management of fatigue with COPD Reviewed energy conservation Reviewed upcoming scheduled appointments  Symptom Management: Take medications as prescribed   Attend all scheduled provider appointments Call pharmacy for medication refills 3-7 days in advance of running out of medications Attend church or other social activities Perform all self care activities independently  Perform IADL's (shopping, preparing meals, housekeeping, managing finances) independently Call provider office for new concerns or questions  identify and remove indoor air pollutants limit outdoor activity during cold weather listen for public air quality announcements every day develop a rescue plan eliminate symptom triggers at home follow rescue plan if symptoms flare-up keep follow-up appointments: primary care provider 04/03/22 at 1040 am don't eat or exercise right before bedtime eat healthy/prescribed diet: heart healthy, carbohydrate modified get at least 7 to 8 hours of sleep at night do breathing exercises every day Follow COPD action- call your doctor early on for change in health status, symptoms Alternate activity with rest  Follow Up Plan: Telephone follow up appointment with care management team member  scheduled for:   11/03/22 at 9 am       CCM (DIABETES) EXPECTED OUTCOME:  MONITOR, SELF-MANAGE AND  REDUCE SYMPTOMS OF DIABETES       Current Barriers:  Knowledge Deficits related to Diabetes management Chronic Disease Management support and education needs related to Diabetes, diet No Advanced Directives in place- information previously mailed Patient reports he checks CBG once daily fasting with all readings under 130 with today's reading 122, states will continue to work towards goal of AIC <7, happy that most recent AIC on 05/16/22 is 7.3 down from 8.4  Planned Interventions: Counseled on importance of regular laboratory monitoring as prescribed;        Discussed plans with patient for ongoing care management follow up and provided patient with direct contact information for care management team;      Provided patient with written educational materials related to hypo and hyperglycemia and importance of correct treatment;       Advised patient, providing education and rationale, to check cbg once daily and record        call provider for findings outside established parameters;       Review of patient status, including review of consultants reports, relevant laboratory and other test results, and medications completed;       Reviewed carbohydrate modified diet and nutritious food choices  Symptom Management: Take medications as prescribed   Attend all scheduled provider appointments Call pharmacy for medication refills 3-7 days in advance of running out of medications Attend church or other social activities Perform all self care activities independently  Perform IADL's (shopping, preparing meals, housekeeping, managing finances) independently Call provider office for new concerns or questions  check blood sugar at prescribed times: once daily check feet daily for cuts, sores or redness enter blood sugar readings and medication or insulin into daily log take the blood sugar log to all doctor visits take the blood sugar meter to all doctor visits trim toenails straight across fill  half of plate with vegetables limit fast food meals to no more than 1 per week manage portion size read food labels for fat, fiber, carbohydrates and portion size set a realistic goal keep feet up while sitting wash and dry feet carefully every day wear comfortable, cotton socks Follow carbohydrate modified diet Continue to exercise daily  Follow Up Plan: Telephone follow up appointment with care management team member scheduled for:    11/03/22 at 9 am        Patient verbalizes understanding of instructions and care plan provided today and agrees to view in MyChart. Active MyChart status and patient understanding of how to access instructions and care plan via MyChart confirmed with patient.  Telephone follow up appointment with care management team member scheduled for:  11/03/22 at 9 am

## 2022-09-03 DIAGNOSIS — J449 Chronic obstructive pulmonary disease, unspecified: Secondary | ICD-10-CM

## 2022-09-03 DIAGNOSIS — Z794 Long term (current) use of insulin: Secondary | ICD-10-CM | POA: Diagnosis not present

## 2022-09-03 DIAGNOSIS — E1159 Type 2 diabetes mellitus with other circulatory complications: Secondary | ICD-10-CM | POA: Diagnosis not present

## 2022-10-02 ENCOUNTER — Ambulatory Visit (INDEPENDENT_AMBULATORY_CARE_PROVIDER_SITE_OTHER): Payer: No Typology Code available for payment source

## 2022-10-02 VITALS — Ht 66.0 in | Wt 170.0 lb

## 2022-10-02 DIAGNOSIS — Z0000 Encounter for general adult medical examination without abnormal findings: Secondary | ICD-10-CM

## 2022-10-02 NOTE — Progress Notes (Signed)
Subjective:   Ryan Mcintyre is a 67 y.o. male who presents for Medicare Annual/Subsequent preventive examination.  Visit Complete: Virtual  I connected with  Ryan Mcintyre on 10/02/22 by a audio enabled telemedicine application and verified that I am speaking with the correct person using two identifiers.  Patient Location: Home  Provider Location: Home Office  I discussed the limitations of evaluation and management by telemedicine. The patient expressed understanding and agreed to proceed.  Patient Medicare AWV questionnaire was completed by the patient on 10/02/2022; I have confirmed that all information answered by patient is correct and no changes since this date. Vital Signs: Unable to obtain new vitals due to this being a telehealth visit.  Review of Systems    Nutrition Risk Assessment:  Has the patient had any N/V/D within the last 2 months?  No  Does the patient have any non-healing wounds?  No  Has the patient had any unintentional weight loss or weight gain?  No   Diabetes:  Is the patient diabetic?  Yes  If diabetic, was a CBG obtained today?  No  Did the patient bring in their glucometer from home?  No  How often do you monitor your CBG's? 3 x day .   Financial Strains and Diabetes Management:  Are you having any financial strains with the device, your supplies or your medication? No .  Does the patient want to be seen by Chronic Care Management for management of their diabetes?  No  Would the patient like to be referred to a Nutritionist or for Diabetic Management?  No   Diabetic Exams:  Diabetic Eye Exam: Completed 01/2022 Diabetic Foot Exam: Overdue, Pt has been advised about the importance in completing this exam. Pt is scheduled for diabetic foot exam on next office visit .  Cardiac Risk Factors include: advanced age (>52men, >23 women);diabetes mellitus;dyslipidemia;male gender;hypertension     Objective:    Today's Vitals   10/02/22 1117   Weight: 170 lb (77.1 kg)  Height: 5\' 6"  (1.676 m)   Body mass index is 27.44 kg/m.     10/02/2022   11:20 AM 03/21/2022    1:27 PM 03/21/2022    1:01 PM 09/28/2021   11:19 AM 09/21/2020    1:37 PM 05/23/2017    1:27 PM 05/23/2016    3:11 PM  Advanced Directives  Does Patient Have a Medical Advance Directive? No No No No No No No  Would patient like information on creating a medical advance directive? Yes (MAU/Ambulatory/Procedural Areas - Information given) Yes (MAU/Ambulatory/Procedural Areas - Information given) No - Guardian declined No - Patient declined No - Patient declined No - Patient declined Yes (MAU/Ambulatory/Procedural Areas - Information given)    Current Medications (verified) Outpatient Encounter Medications as of 10/02/2022  Medication Sig   blood glucose meter kit and supplies Dispense based on patient and insurance preference. Use up to four times daily as directed. (FOR ICD-10 E10.9, E11.9).   dapagliflozin propanediol (FARXIGA) 10 MG TABS tablet Take 1 tablet (10 mg total) by mouth daily before breakfast.   glimepiride (AMARYL) 4 MG tablet Take 1 tablet (4 mg total) by mouth daily before breakfast.   glucose blood test strip Test daily.   insulin glargine, 2 Unit Dial, (TOUJEO MAX) 300 UNIT/ML Solostar Pen Inject 8 Units into the skin at bedtime.   metFORMIN (GLUCOPHAGE) 1000 MG tablet Take 1 tablet (1,000 mg total) by mouth 2 (two) times daily with a meal.   OMEPRAZOLE  PO Take by mouth.   rosuvastatin (CRESTOR) 5 MG tablet Take 1 tablet (5 mg total) by mouth daily.   sildenafil (VIAGRA) 100 MG tablet Take 0.5-1 tablets (50-100 mg total) by mouth daily as needed for erectile dysfunction.   No facility-administered encounter medications on file as of 10/02/2022.    Allergies (verified) Patient has no known allergies.   History: Past Medical History:  Diagnosis Date   COPD (chronic obstructive pulmonary disease) (HCC) 02/21/2017   Diabetes mellitus without  complication (HCC)    GERD (gastroesophageal reflux disease)    Hyperlipidemia    Past Surgical History:  Procedure Laterality Date   CIRCUMCISION  1974   Family History  Problem Relation Age of Onset   Hypertension Mother    Diabetes Mother    Diabetes Sister    Early death Sister    Early death Brother    Early death Brother    Social History   Socioeconomic History   Marital status: Married    Spouse name: Mary   Number of children: 3   Years of education: 12   Highest education level: 12th grade  Occupational History   Occupation: Designer, fashion/clothing    Comment: Disabled  Tobacco Use   Smoking status: Former    Current packs/day: 0.00    Average packs/day: 0.3 packs/day for 50.0 years (12.5 ttl pk-yrs)    Types: Cigarettes    Start date: 04/06/1970    Quit date: 04/06/2020    Years since quitting: 2.4   Smokeless tobacco: Never   Tobacco comments:    about 3 to 4 cigarettes a day - finally quit completely  Vaping Use   Vaping status: Never Used  Substance and Sexual Activity   Alcohol use: No    Alcohol/week: 0.0 standard drinks of alcohol   Drug use: No   Sexual activity: Yes  Other Topics Concern   Not on file  Social History Narrative   Lives on one level home with wife   They enjoy going dancing every Thursday night   Social Determinants of Health   Financial Resource Strain: Low Risk  (10/02/2022)   Overall Financial Resource Strain (CARDIA)    Difficulty of Paying Living Expenses: Not hard at all  Food Insecurity: No Food Insecurity (10/02/2022)   Hunger Vital Sign    Worried About Running Out of Food in the Last Year: Never true    Ran Out of Food in the Last Year: Never true  Transportation Needs: No Transportation Needs (10/02/2022)   PRAPARE - Administrator, Civil Service (Medical): No    Lack of Transportation (Non-Medical): No  Physical Activity: Insufficiently Active (10/02/2022)   Exercise Vital Sign    Days of Exercise per Week: 3 days     Minutes of Exercise per Session: 30 min  Stress: No Stress Concern Present (10/02/2022)   [employer] of Occupational Health - Occupational Stress Questionnaire    Feeling of Stress : Not at all  Social Connections: Moderately Integrated (10/02/2022)   Social Connection and Isolation Panel [NHANES]    Frequency of Communication with Friends and Family: More than three times a week    Frequency of Social Gatherings with Friends and Family: More than three times a week    Attends Religious Services: More than 4 times per year    Active Member of [employer] or Organizations: No    Attends Banker Meetings: Never    Marital Status: Married    Tobacco  Counseling Counseling given: Not Answered Tobacco comments: about 3 to 4 cigarettes a day - finally quit completely   Clinical Intake:  Pre-visit preparation completed: Yes  Pain : No/denies pain     Nutritional Risks: None Diabetes: No  How often do you need to have someone help you when you read instructions, pamphlets, or other written materials from your doctor or pharmacy?: 1 - Never  Interpreter Needed?: No  Information entered by :: Renie Ora, LPN   Activities of Daily Living    10/02/2022   11:20 AM  In your present state of health, do you have any difficulty performing the following activities:  Hearing? 0  Vision? 0  Difficulty concentrating or making decisions? 0  Walking or climbing stairs? 0  Dressing or bathing? 0  Doing errands, shopping? 0  Preparing Food and eating ? N  Using the Toilet? N  In the past six months, have you accidently leaked urine? N  Do you have problems with loss of bowel control? N  Managing your Medications? N  Managing your Finances? N  Housekeeping or managing your Housekeeping? N    Patient Care Team: Junie Spencer, FNP as PCP - General (Nurse Practitioner) Mikal Plane, MD as Consulting Physician [place], Lilla Shook, [facility] as Pharmacist (Family  Medicine) Audrie Gallus, RN as [facility] Care Management  Indicate any recent Medical Services you may have received from other than [facility] providers in the past year (date may be approximate).     Assessment:   This is a routine wellness examination for Ryan Mcintyre.  Hearing/Vision screen Vision Screening - Comments:: Wears rx glasses - up to date with routine eye exams with  Dr Laural Benes   Dietary issues and exercise activities discussed:     Goals Addressed             This Visit's Progress    Patient Stated   On track    09/28/2021 AWV Goal: Fall Prevention  Over the next year, patient will decrease their risk for falls by: Using assistive devices, such as a cane or walker, as needed Identifying fall risks within their home and correcting them by: Removing throw rugs Adding handrails to stairs or ramps Removing clutter and keeping a clear pathway throughout the home Increasing light, especially at night Adding shower handles/bars Raising toilet seat Identifying potential personal risk factors for falls: Medication side effects Incontinence/urgency Vestibular dysfunction Hearing loss Musculoskeletal disorders Neurological disorders Orthostatic hypotension         Depression Screen    10/02/2022   11:19 AM 05/16/2022    8:42 AM 05/16/2022    8:15 AM 03/21/2022    1:22 PM 01/20/2022   10:55 AM 09/28/2021   11:19 AM 09/16/2020    9:04 AM  PHQ 2/9 Scores  PHQ - 2 Score 0 0 0 0 0 0 0  PHQ- 9 Score   0  3      Fall Risk    10/02/2022   11:18 AM 05/16/2022    8:15 AM 03/21/2022   12:59 PM 01/20/2022   10:55 AM 09/28/2021   11:14 AM  Fall Risk   Falls in the past year? 0 0 0 0 0  Number falls in past yr: 0 0   0  Injury with Fall? 0 0   0  Risk for fall due to : No Fall Risks History of fall(s)   No Fall Risks  Follow up Falls prevention discussed Falls evaluation completed  Falls prevention discussed    MEDICARE RISK AT HOME:  Medicare Risk at  Home - 10/02/22 1118     Any stairs in or around the home? Yes    If so, are there any without handrails? No    Home free of loose throw rugs in walkways, pet beds, electrical cords, etc? Yes    Adequate lighting in your home to reduce risk of falls? Yes    Life alert? No    Use of a cane, walker or w/c? No    Grab bars in the bathroom? No    Shower chair or bench in shower? No    Elevated toilet seat or a handicapped toilet? No             TIMED UP AND GO:  Was the test performed?  No    Cognitive Function:    05/23/2017    1:41 PM 05/23/2016    3:21 PM  MMSE - Mini Mental State Exam  Orientation to time 5 4  Orientation to Place 5 5  Registration 3 3  Attention/ Calculation 5 5  Recall 3 3  Language- name 2 objects 2 2  Language- repeat 1 1  Language- follow 3 step command 3 3  Language- read & follow direction 1 1  Write a sentence 1 1  Copy design 1 1  Total score 30 29        10/02/2022   11:20 AM 09/28/2021   11:33 AM 09/21/2020    1:39 PM  6CIT Screen  What Year? 0 points 0 points 0 points  What month? 0 points 0 points 0 points  What time? 0 points 0 points 0 points  Count back from 20 0 points 0 points 0 points  Months in reverse 0 points 0 points 0 points  Repeat phrase 0 points 0 points 0 points  Total Score 0 points 0 points 0 points    Immunizations Immunization History  Administered Date(s) Administered   Fluad Quad(high Dose 65+) 12/06/2018, 12/16/2021   Influenza Inj Mdck Quad Pf 12/22/2016   Influenza,inj,Quad PF,6+ Mos 12/30/2014, 01/18/2018   Influenza-Unspecified 12/22/2016   Moderna Sars-Covid-2 Vaccination 05/21/2019, 06/18/2019   Pneumococcal Conjugate-13 09/28/2014   Pneumococcal Polysaccharide-23 12/08/2020   Tdap 09/28/2014   Zoster Recombinant(Shingrix) 12/08/2020, 10/13/2021    TDAP status: Up to date  Flu Vaccine status: Up to date  Pneumococcal vaccine status: Up to date  Covid-19 vaccine status: Completed  vaccines  Qualifies for Shingles Vaccine? Yes   Zostavax completed Yes   Shingrix Completed?: Yes  Screening Tests Health Maintenance  Topic Date Due   Diabetic kidney evaluation - Urine ACR  10/14/2022   INFLUENZA VACCINE  10/05/2022   FOOT EXAM  10/14/2022   HEMOGLOBIN A1C  11/16/2022   OPHTHALMOLOGY EXAM  12/16/2022   Colonoscopy  03/11/2023   Diabetic kidney evaluation - eGFR measurement  05/16/2023   Lung Cancer Screening  05/19/2023   Medicare Annual Wellness (AWV)  10/02/2023   DTaP/Tdap/Td (2 - Td or Tdap) 09/27/2024   Pneumonia Vaccine 52+ Years old  Completed   Hepatitis C Screening  Completed   Zoster Vaccines- Shingrix  Completed   HPV VACCINES  Aged Out   COVID-19 Vaccine  Discontinued    Health Maintenance  Health Maintenance Due  Topic Date Due   Diabetic kidney evaluation - Urine ACR  10/14/2022    Colorectal cancer screening: Type of screening: Colonoscopy. Completed 03/10/2013. Repeat every 10 years  Lung Cancer  Screening: (Low Dose CT Chest recommended if Age 28-80 years, 20 pack-year currently smoking OR have quit w/in 15years.) does qualify.   Lung Cancer Screening Referral: completed 05/19/2022  Additional Screening:  Hepatitis C Screening: does not qualify; Completed 02/21/2017  Vision Screening: Recommended annual ophthalmology exams for early detection of glaucoma and other disorders of the eye. Is the patient up to date with their annual eye exam?  Yes  Who is the provider or what is the name of the office in which the patient attends annual eye exams? Dr Laural Benes  If pt is not established with a provider, would they like to be referred to a provider to establish care? No .   Dental Screening: Recommended annual dental exams for proper oral hygiene    Community Resource Referral / Chronic Care Management: CRR required this visit?  Yes   CCM required this visit?  No     Plan:     I have personally reviewed and noted the following  in the patient's chart:   Medical and social history Use of alcohol, tobacco or illicit drugs  Current medications and supplements including opioid prescriptions. Patient is not currently taking opioid prescriptions. Functional ability and status Nutritional status Physical activity Advanced directives List of other physicians Hospitalizations, surgeries, and ER visits in previous 12 months Vitals Screenings to include cognitive, depression, and falls Referrals and appointments  In addition, I have reviewed and discussed with patient certain preventive protocols, quality metrics, and best practice recommendations. A written personalized care plan for preventive services as well as general preventive health recommendations were provided to patient.     Lorrene Reid, LPN   5/73/2202   After Visit Summary: (MyChart) Due to this being a telephonic visit, the after visit summary with patients personalized plan was offered to patient via MyChart   Nurse Notes: none

## 2022-10-02 NOTE — Patient Instructions (Signed)
[name] , Thank you for taking time to come for your Medicare Wellness Visit. I appreciate your ongoing commitment to your health goals. Please review the following plan we discussed and let me know if I can assist you in the future.   Referrals/Orders/Follow-Ups/Clinician Recommendations: Aim for 30 minutes of exercise or brisk walking, 6-8 glasses of water, and 5 servings of fruits and vegetables each day.   This is a list of the screening recommended for you and due dates:  Health Maintenance  Topic Date Due   Yearly kidney health urinalysis for diabetes  10/14/2022   Flu Shot  10/05/2022   Complete foot exam   10/14/2022   Hemoglobin A1C  11/16/2022   Eye exam for diabetics  12/16/2022   Colon Cancer Screening  03/11/2023   Yearly kidney function blood test for diabetes  05/16/2023   Screening for Lung Cancer  05/19/2023   Medicare Annual Wellness Visit  10/02/2023   DTaP/Tdap/Td vaccine (2 - Td or Tdap) 09/27/2024   Pneumonia Vaccine  Completed   Hepatitis C Screening  Completed   Zoster (Shingles) Vaccine  Completed   HPV Vaccine  Aged Out   COVID-19 Vaccine  Discontinued    Advanced directives: (ACP Link)Information on Advanced Care Planning can be found at [facility] Advance Health Care Directives Advance Health Care Directives ([email or web address]   Next Medicare Annual Wellness Visit scheduled for next year: Yes  Preventive Care 65 Years and Older, Male  Preventive care refers to lifestyle choices and visits with your health care provider that can promote health and wellness. What does preventive care include? A yearly physical exam. This is also called an annual well check. Dental exams once or twice a year. Routine eye exams. Ask your health care provider how often you should have your eyes checked. Personal lifestyle choices, including: Daily care of your teeth and gums. Regular physical activity. Eating a healthy diet. Avoiding tobacco and drug  use. Limiting alcohol use. Practicing safe sex. Taking low doses of aspirin every day. Taking vitamin and mineral supplements as recommended by your health care provider. What happens during an annual well check? The services and screenings done by your health care provider during your annual well check will depend on your age, overall health, lifestyle risk factors, and family history of disease. Counseling  Your health care provider may ask you questions about your: Alcohol use. Tobacco use. Drug use. Emotional well-being. Home and relationship well-being. Sexual activity. Eating habits. History of falls. Memory and ability to understand (cognition). Work and work Astronomer. Screening  You may have the following tests or measurements: Height, weight, and BMI. Blood pressure. Lipid and cholesterol levels. These may be checked every 5 years, or more frequently if you are over 78 years old. Skin check. Lung cancer screening. You may have this screening every year starting at age 60 if you have a 30-pack-year history of smoking and currently smoke or have quit within the past 15 years. Fecal occult blood test (FOBT) of the stool. You may have this test every year starting at age 38. Flexible sigmoidoscopy or colonoscopy. You may have a sigmoidoscopy every 5 years or a colonoscopy every 10 years starting at age 66. Prostate cancer screening. Recommendations will vary depending on your family history and other risks. Hepatitis C blood test. Hepatitis B blood test. Sexually transmitted disease (STD) testing. Diabetes screening. This is done by checking your blood sugar (glucose) after you have not eaten for a  while (fasting). You may have this done every 1-3 years. Abdominal aortic aneurysm (AAA) screening. You may need this if you are a current or former smoker. Osteoporosis. You may be screened starting at age 12 if you are at high risk. Talk with your health care provider about  your test results, treatment options, and if necessary, the need for more tests. Vaccines  Your health care provider may recommend certain vaccines, such as: Influenza vaccine. This is recommended every year. Tetanus, diphtheria, and acellular pertussis (Tdap, Td) vaccine. You may need a Td booster every 10 years. Zoster vaccine. You may need this after age 11. Pneumococcal 13-valent conjugate (PCV13) vaccine. One dose is recommended after age 37. Pneumococcal polysaccharide (PPSV23) vaccine. One dose is recommended after age 23. Talk to your health care provider about which screenings and vaccines you need and how often you need them. This information is not intended to replace advice given to you by your health care provider. Make sure you discuss any questions you have with your health care provider. Document Released: 03/19/2015 Document Revised: 11/10/2015 Document Reviewed: 12/22/2014 Elsevier Interactive Patient Education  2017 [employer].  Fall Prevention in the Home Falls can cause injuries. They can happen to people of all ages. There are many things you can do to make your home safe and to help prevent falls. What can I do on the outside of my home? Regularly fix the edges of walkways and driveways and fix any cracks. Remove anything that might make you trip as you walk through a door, such as a raised step or threshold. Trim any bushes or trees on the path to your home. Use bright outdoor lighting. Clear any walking paths of anything that might make someone trip, such as rocks or tools. Regularly check to see if handrails are loose or broken. Make sure that both sides of any steps have handrails. Any raised decks and porches should have guardrails on the edges. Have any leaves, snow, or ice cleared regularly. Use sand or salt on walking paths during winter. Clean up any spills in your garage right away. This includes oil or grease spills. What can I do in the bathroom? Use  night lights. Install grab bars by the toilet and in the tub and shower. Do not use towel bars as grab bars. Use non-skid mats or decals in the tub or shower. If you need to sit down in the shower, use a plastic, non-slip stool. Keep the floor dry. Clean up any water that spills on the floor as soon as it happens. Remove soap buildup in the tub or shower regularly. Attach bath mats securely with double-sided non-slip rug tape. Do not have throw rugs and other things on the floor that can make you trip. What can I do in the bedroom? Use night lights. Make sure that you have a light by your bed that is easy to reach. Do not use any sheets or blankets that are too big for your bed. They should not hang down onto the floor. Have a firm chair that has side arms. You can use this for support while you get dressed. Do not have throw rugs and other things on the floor that can make you trip. What can I do in the kitchen? Clean up any spills right away. Avoid walking on wet floors. Keep items that you use a lot in easy-to-reach places. If you need to reach something above you, use a strong step stool that has a grab  bar. Keep electrical cords out of the way. Do not use floor polish or wax that makes floors slippery. If you must use wax, use non-skid floor wax. Do not have throw rugs and other things on the floor that can make you trip. What can I do with my stairs? Do not leave any items on the stairs. Make sure that there are handrails on both sides of the stairs and use them. Fix handrails that are broken or loose. Make sure that handrails are as long as the stairways. Check any carpeting to make sure that it is firmly attached to the stairs. Fix any carpet that is loose or worn. Avoid having throw rugs at the top or bottom of the stairs. If you do have throw rugs, attach them to the floor with carpet tape. Make sure that you have a light switch at the top of the stairs and the bottom of the  stairs. If you do not have them, ask someone to add them for you. What else can I do to help prevent falls? Wear shoes that: Do not have high heels. Have rubber bottoms. Are comfortable and fit you well. Are closed at the toe. Do not wear sandals. If you use a stepladder: Make sure that it is fully opened. Do not climb a closed stepladder. Make sure that both sides of the stepladder are locked into place. Ask someone to hold it for you, if possible. Clearly mark and make sure that you can see: Any grab bars or handrails. First and last steps. Where the edge of each step is. Use tools that help you move around (mobility aids) if they are needed. These include: Canes. Walkers. Scooters. Crutches. Turn on the lights when you go into a dark area. Replace any light bulbs as soon as they burn out. Set up your furniture so you have a clear path. Avoid moving your furniture around. If any of your floors are uneven, fix them. If there are any pets around you, be aware of where they are. Review your medicines with your doctor. Some medicines can make you feel dizzy. This can increase your chance of falling. Ask your doctor what other things that you can do to help prevent falls. This information is not intended to replace advice given to you by your health care provider. Make sure you discuss any questions you have with your health care provider. Document Released: 12/17/2008 Document Revised: 07/29/2015 Document Reviewed: 03/27/2014 Elsevier Interactive Patient Education  2017 [employer].

## 2022-10-17 ENCOUNTER — Ambulatory Visit: Payer: No Typology Code available for payment source | Admitting: Family

## 2022-10-17 ENCOUNTER — Encounter: Payer: Self-pay | Admitting: Family

## 2022-10-17 VITALS — BP 136/74 | HR 86 | Temp 97.2°F | Ht 66.0 in | Wt 173.0 lb

## 2022-10-17 DIAGNOSIS — E785 Hyperlipidemia, unspecified: Secondary | ICD-10-CM

## 2022-10-17 DIAGNOSIS — Z0001 Encounter for general adult medical examination with abnormal findings: Secondary | ICD-10-CM

## 2022-10-17 DIAGNOSIS — Z87891 Personal history of nicotine dependence: Secondary | ICD-10-CM | POA: Diagnosis not present

## 2022-10-17 DIAGNOSIS — J410 Simple chronic bronchitis: Secondary | ICD-10-CM

## 2022-10-17 DIAGNOSIS — Z0000 Encounter for general adult medical examination without abnormal findings: Secondary | ICD-10-CM

## 2022-10-17 DIAGNOSIS — K219 Gastro-esophageal reflux disease without esophagitis: Secondary | ICD-10-CM | POA: Diagnosis not present

## 2022-10-17 DIAGNOSIS — L821 Other seborrheic keratosis: Secondary | ICD-10-CM | POA: Diagnosis not present

## 2022-10-17 DIAGNOSIS — E663 Overweight: Secondary | ICD-10-CM | POA: Diagnosis not present

## 2022-10-17 DIAGNOSIS — E1169 Type 2 diabetes mellitus with other specified complication: Secondary | ICD-10-CM

## 2022-10-17 DIAGNOSIS — I700 Atherosclerosis of aorta: Secondary | ICD-10-CM | POA: Diagnosis not present

## 2022-10-17 DIAGNOSIS — Z125 Encounter for screening for malignant neoplasm of prostate: Secondary | ICD-10-CM | POA: Diagnosis not present

## 2022-10-17 LAB — TSH

## 2022-10-17 LAB — CMP14+EGFR
ALT: 14 IU/L (ref 0–44)
AST: 13 IU/L (ref 0–40)
Albumin: 4.3 g/dL (ref 3.9–4.9)
Alkaline Phosphatase: 94 IU/L (ref 44–121)
BUN/Creatinine Ratio: 16 (ref 10–24)
BUN: 16 mg/dL (ref 8–27)
Bilirubin Total: 0.4 mg/dL (ref 0.0–1.2)
CO2: 26 mmol/L (ref 20–29)
Calcium: 9.4 mg/dL (ref 8.6–10.2)
Chloride: 102 mmol/L (ref 96–106)
Creatinine, Ser: 1 mg/dL (ref 0.76–1.27)
Globulin, Total: 2.4 g/dL (ref 1.5–4.5)
Glucose: 256 mg/dL — ABNORMAL HIGH (ref 70–99)
Potassium: 4.8 mmol/L (ref 3.5–5.2)
Sodium: 141 mmol/L (ref 134–144)
Total Protein: 6.7 g/dL (ref 6.0–8.5)
eGFR: 81 mL/min/{1.73_m2} (ref >59)

## 2022-10-17 LAB — PSA, TOTAL AND FREE

## 2022-10-17 LAB — LIPID PANEL
Chol/HDL Ratio: 5.3 ratio — ABNORMAL HIGH (ref 0.0–5.0)
Cholesterol, Total: 158 mg/dL (ref 100–199)
HDL: 30 mg/dL — ABNORMAL LOW (ref >39)
LDL Chol Calc (NIH): 95 mg/dL (ref 0–99)
Triglycerides: 192 mg/dL — ABNORMAL HIGH (ref 0–149)
VLDL Cholesterol Cal: 33 mg/dL (ref 5–40)

## 2022-10-17 LAB — CBC WITH DIFFERENTIAL/PLATELET
Basophils Absolute: 0.1 10*3/uL (ref 0.0–0.2)
Basos: 1 %
EOS (ABSOLUTE): 0.2 10*3/uL (ref 0.0–0.4)
Eos: 3 %
Hematocrit: 50.4 % (ref 37.5–51.0)
Hemoglobin: 17.1 g/dL (ref 13.0–17.7)
Immature Grans (Abs): 0 10*3/uL (ref 0.0–0.1)
Immature Granulocytes: 0 %
Lymphocytes Absolute: 1.7 10*3/uL (ref 0.7–3.1)
Lymphs: 24 %
MCH: 29.4 pg (ref 26.6–33.0)
MCHC: 33.9 g/dL (ref 31.5–35.7)
MCV: 87 fL (ref 79–97)
Monocytes Absolute: 0.6 10*3/uL (ref 0.1–0.9)
Monocytes: 8 %
Neutrophils Absolute: 4.5 10*3/uL (ref 1.4–7.0)
Neutrophils: 64 %
Platelets: 220 10*3/uL (ref 150–450)
RBC: 5.82 x10E6/uL — ABNORMAL HIGH (ref 4.14–5.80)
RDW: 12.9 % (ref 11.6–15.4)
WBC: 7 10*3/uL (ref 3.4–10.8)

## 2022-10-17 LAB — BAYER DCA HB A1C WAIVED: HB A1C (BAYER DCA - WAIVED): 9.9 % — ABNORMAL HIGH (ref 4.8–5.6)

## 2022-10-17 MED ORDER — ASPIRIN 81 MG PO TBEC: 81.0000 mg | DELAYED_RELEASE_TABLET | Freq: Every day | ORAL | 1 refills | Status: DC

## 2022-10-17 NOTE — Progress Notes (Signed)
Subjective:    Patient ID: Ryan Mcintyre, male    DOB: 04/14/1955, 67 y.o.   MRN: [id number]  Chief Complaint  Patient presents with   Hair/Scalp Problem    Spot on top on scalp been there couple years has gotten bigger. Does not hurt or itch    bumps    On left arm and tingle for 1 mth    Pt presents to the office today for CPE and chronic follow up.   He has COPD and quit smoking 08/2022.  Denies any SOB. Reports he smoked for 57 years. States he is riding his bike and hiking.    He had ED and takes viagra as needed   He has aortic atherosclerosis is staking Crestor 5 mg, but has myalgia on the 10 mg.   Complaining of skin lesion on his scalp that has been there for "years" but seems like it has become larger. Denies any pain, redness, or discharge. Gastroesophageal Reflux He complains of belching and heartburn. This is a chronic problem. The current episode started more than 1 year ago. The problem occurs occasionally. He has tried a PPI for the symptoms. The treatment provided moderate relief.  Hyperlipidemia This is a chronic problem. The current episode started more than 1 year ago. The problem is uncontrolled. Exacerbating diseases include obesity. Current antihyperlipidemic treatment includes statins. The current treatment provides moderate improvement of lipids. Risk factors for coronary artery disease include dyslipidemia, diabetes mellitus, hypertension, male sex and a sedentary lifestyle.  Diabetes He presents for his follow-up diabetic visit. He has type 2 diabetes mellitus. Pertinent negatives for diabetes include no blurred vision and no foot paresthesias. Symptoms are stable. Risk factors for coronary artery disease include dyslipidemia, diabetes mellitus, hypertension and sedentary lifestyle. He is following a generally healthy diet. His overall blood glucose range is 110-130 mg/dl. Eye exam is current.      Review of Systems  Eyes:  Negative for blurred vision.   Gastrointestinal:  Positive for heartburn.  All other systems reviewed and are negative.  Family History  Problem Relation Age of Onset   Hypertension Mother    Diabetes Mother    Diabetes Sister    Early death Sister    Early death Brother    Early death Brother    Social History   Socioeconomic History   Marital status: Married    Spouse name: Mary   Number of children: 3   Years of education: 12   Highest education level: 12th grade  Occupational History   Occupation: Designer, fashion/clothing    Comment: Disabled  Tobacco Use   Smoking status: Former    Current packs/day: 0.00    Average packs/day: 0.3 packs/day for 50.0 years (12.5 ttl pk-yrs)    Types: Cigarettes    Start date: 04/06/1970    Quit date: 04/06/2020    Years since quitting: 2.5   Smokeless tobacco: Never   Tobacco comments:    about 3 to 4 cigarettes a day - finally quit completely  Vaping Use   Vaping status: Never Used  Substance and Sexual Activity   Alcohol use: No    Alcohol/week: 0.0 standard drinks of alcohol   Drug use: No   Sexual activity: Yes  Other Topics Concern   Not on file  Social History Narrative   Lives on one level home with wife   They enjoy going dancing every Thursday night   Social Determinants of Health   Financial Resource Strain: Low  Risk  (10/02/2022)   Overall Financial Resource Strain (CARDIA)    Difficulty of Paying Living Expenses: Not hard at all  Food Insecurity: No Food Insecurity (10/02/2022)   Hunger Vital Sign    Worried About Running Out of Food in the Last Year: Never true    Ran Out of Food in the Last Year: Never true  Transportation Needs: No Transportation Needs (10/02/2022)   PRAPARE - Administrator, Civil Service (Medical): No    Lack of Transportation (Non-Medical): No  Physical Activity: Insufficiently Active (10/02/2022)   Exercise Vital Sign    Days of Exercise per Week: 3 days    Minutes of Exercise per Session: 30 min  Stress: No Stress  Concern Present (10/02/2022)   [employer] of Occupational Health - Occupational Stress Questionnaire    Feeling of Stress : Not at all  Social Connections: Moderately Integrated (10/02/2022)   Social Connection and Isolation Panel [NHANES]    Frequency of Communication with Friends and Family: More than three times a week    Frequency of Social Gatherings with Friends and Family: More than three times a week    Attends Religious Services: More than 4 times per year    Active Member of [employer] or Organizations: No    Attends Banker Meetings: Never    Marital Status: Married       Objective:   Physical Exam Vitals reviewed.  Constitutional:      General: He is not in acute distress.    Appearance: He is well-developed.  HENT:     Head: Normocephalic.     Right Ear: Tympanic membrane normal.     Left Ear: Tympanic membrane normal.  Eyes:     General:        Right eye: No discharge.        Left eye: No discharge.     Pupils: Pupils are equal, round, and reactive to light.  Neck:     Thyroid: No thyromegaly.  Cardiovascular:     Rate and Rhythm: Normal rate and regular rhythm.     Heart sounds: Normal heart sounds. No murmur heard. Pulmonary:     Effort: Pulmonary effort is normal. No respiratory distress.     Breath sounds: Normal breath sounds. No wheezing.  Abdominal:     General: Bowel sounds are normal. There is no distension.     Palpations: Abdomen is soft.     Tenderness: There is no abdominal tenderness.  Musculoskeletal:        General: No tenderness. Normal range of motion.     Cervical back: Normal range of motion and neck supple.  Skin:    General: Skin is warm and dry.     Findings: Lesion present. No erythema or rash.          Comments: Two rough brown papules  Neurological:     Mental Status: He is alert and oriented to person, place, and time.     Cranial Nerves: No cranial nerve deficit.     Deep Tendon Reflexes: Reflexes are normal  and symmetric.  Psychiatric:        Behavior: Behavior normal.        Thought Content: Thought content normal.        Judgment: Judgment normal.     BP 136/74   Pulse 86   Temp (!) 97.2 F (36.2 C) (Temporal)   Ht 5\' 6"  (1.676 m)   Wt 173 lb (78.5  kg)   SpO2 95%   BMI 27.92 kg/m      Assessment & Plan:  ROSEMARIE MAURICIO comes in today with chief complaint of Hair/Scalp Problem (Spot on top on scalp been there couple years has gotten bigger. Does not hurt or itch ) and bumps (On left arm and tingle for 1 mth )   Diagnosis and orders addressed:  1. Annual physical exam - Bayer DCA Hb A1c Waived - CBC with Differential/Platelet - Lipid panel - Microalbumin / creatinine urine ratio - CMP14+EGFR - PSA, total and free - TSH  2. Aortic atherosclerosis (HCC) - CBC with Differential/Platelet - CMP14+EGFR - aspirin EC 81 MG tablet; Take 1 tablet (81 mg total) by mouth daily. Swallow whole.  Dispense: 90 tablet; Refill: 1  3. Simple chronic bronchitis (HCC) - CBC with Differential/Platelet - CMP14+EGFR  4. Type 2 diabetes mellitus with other specified complication, without long-term current use of insulin (HCC) - Bayer DCA Hb A1c Waived - CBC with Differential/Platelet - Microalbumin / creatinine urine ratio - CMP14+EGFR - aspirin EC 81 MG tablet; Take 1 tablet (81 mg total) by mouth daily. Swallow whole.  Dispense: 90 tablet; Refill: 1  5. Former smoker - CBC with Differential/Platelet - CMP14+EGFR  6. Gastroesophageal reflux disease, unspecified whether esophagitis present - CBC with Differential/Platelet - CMP14+EGFR  7. History of smoking greater than 50 pack years - CBC with Differential/Platelet - CMP14+EGFR  8. Hyperlipidemia associated with type 2 diabetes mellitus (HCC) - CBC with Differential/Platelet - Lipid panel - CMP14+EGFR - aspirin EC 81 MG tablet; Take 1 tablet (81 mg total) by mouth daily. Swallow whole.  Dispense: 90 tablet; Refill: 1  9.  Overweight (BMI 25.0-29.9) - CBC with Differential/Platelet - CMP14+EGFR  10. Seborrheic keratosis Avoid picking    Labs pending Health Maintenance reviewed Diet and exercise encouraged  Follow up plan: 3 months    Jannifer Rodney, FNP

## 2022-10-17 NOTE — Patient Instructions (Addendum)
Seborrheic Keratosis A seborrheic keratosis is a common, noncancerous (benign) skin growth. These growths are velvety, waxy, or rough spots that appear on the skin. They are often tan, brown, or black. The skin growths can be flat or raised and may be scaly. What are the causes? The cause of this condition is not known. What increases the risk? You are more likely to develop this condition if you: Have a family history of seborrheic keratosis. Are 75 years old or older. Are pregnant. Have had estrogen replacement therapy. What are the signs or symptoms? Symptoms of this condition include growths on the face, chest, shoulders, back, or other areas. These growths: Are usually painless, but may become irritated and itchy. Can be tan, yellow, brown, black, or other colors. Are slightly raised or have a flat surface. Are sometimes rough or wart-like in texture. Are often velvety or waxy on the surface. Are round or oval-shaped. Often occur in groups, but may occur as a single growth. How is this diagnosed? This condition is diagnosed with a medical history and physical exam. A sample of the growth may be tested (skin biopsy). You may also need to see a skin specialist (dermatologist). How is this treated? Treatment is not usually needed for this condition unless the growths are irritated or bleed often. You may also choose to have the growths removed if you do not like their appearance. Growth removal may include a procedure in which: Liquid nitrogen is applied to "freeze" off the growth (cryosurgery). This is the most common procedure. The growth is burned off with electricity (electrocautery). The growth is removed by scraping (curettage). Follow these instructions at home: Watch your growth or growths for any changes. Do not scratch or pick at the growth or growths. This can cause them to become irritated or infected. Contact a health care provider if: You suddenly have many new  growths. Your growth bleeds, itches, or hurts. Your growth suddenly becomes larger or changes color. Summary A seborrheic keratosis is a common, noncancerous skin growth. Treatment is not usually needed for this condition unless the growths are irritated or bleed often. Watch your growth or growths for any changes. Contact a health care provider if you suddenly have many new growths or your growth suddenly becomes larger or changes color. This information is not intended to replace advice given to you by your health care provider. Make sure you discuss any questions you have with your health care provider. Document Revised: 05/06/2021 Document Reviewed: 05/06/2021 Elsevier Patient Education  2024 Elsevier Inc. Diabetes Mellitus and Nutrition, Adult When you have diabetes, or diabetes mellitus, it is very important to have healthy eating habits because your blood sugar (glucose) levels are greatly affected by what you eat and drink. Eating healthy foods in the right amounts, at about the same times every day, can help you: Manage your blood glucose. Lower your risk of heart disease. Improve your blood pressure. Reach or maintain a healthy weight. What can affect my meal plan? Every person with diabetes is different, and each person has different needs for a meal plan. Your health care provider may recommend that you work with a dietitian to make a meal plan that is best for you. Your meal plan may vary depending on factors such as: The calories you need. The medicines you take. Your weight. Your blood glucose, blood pressure, and cholesterol levels. Your activity level. Other health conditions you have, such as heart or kidney disease. How do carbohydrates affect me? Carbohydrates,  also called carbs, affect your blood glucose level more than any other type of food. Eating carbs raises the amount of glucose in your blood. It is important to know how many carbs you can safely have in each  meal. This is different for every person. Your dietitian can help you calculate how many carbs you should have at each meal and for each snack. How does alcohol affect me? Alcohol can cause a decrease in blood glucose (hypoglycemia), especially if you use insulin or take certain diabetes medicines by mouth. Hypoglycemia can be a life-threatening condition. Symptoms of hypoglycemia, such as sleepiness, dizziness, and confusion, are similar to symptoms of having too much alcohol. Do not drink alcohol if: Your health care provider tells you not to drink. You are pregnant, may be pregnant, or are planning to become pregnant. If you drink alcohol: Limit how much you have to: 0-1 drink a day for women. 0-2 drinks a day for men. Know how much alcohol is in your drink. In the [place] one drink equals one 12 oz bottle of beer (355 mL), one 5 oz glass of wine (148 mL), or one 1 oz glass of hard liquor (44 mL). Keep yourself hydrated with water, diet soda, or unsweetened iced tea. Keep in mind that regular soda, juice, and other mixers may contain a lot of sugar and must be counted as carbs. What are tips for following this plan?  Reading food labels Start by checking the serving size on the Nutrition Facts label of packaged foods and drinks. The number of calories and the amount of carbs, fats, and other nutrients listed on the label are based on one serving of the item. Many items contain more than one serving per package. Check the total grams (g) of carbs in one serving. Check the number of grams of saturated fats and trans fats in one serving. Choose foods that have a low amount or none of these fats. Check the number of milligrams (mg) of salt (sodium) in one serving. Most people should limit total sodium intake to less than 2,300 mg per day. Always check the nutrition information of foods labeled as "low-fat" or "nonfat." These foods may be higher in added sugar or refined carbs and should be  avoided. Talk to your dietitian to identify your daily goals for nutrients listed on the label. Shopping Avoid buying canned, pre-made, or processed foods. These foods tend to be high in fat, sodium, and added sugar. Shop around the outside edge of the grocery store. This is where you will most often find fresh fruits and vegetables, bulk grains, fresh meats, and fresh dairy products. Cooking Use low-heat cooking methods, such as baking, instead of high-heat cooking methods, such as deep frying. Cook using healthy oils, such as olive, canola, or sunflower oil. Avoid cooking with butter, cream, or high-fat meats. Meal planning Eat meals and snacks regularly, preferably at the same times every day. Avoid going long periods of time without eating. Eat foods that are high in fiber, such as fresh fruits, vegetables, beans, and whole grains. Eat 4-6 oz (112-168 g) of lean protein each day, such as lean meat, chicken, fish, eggs, or tofu. One ounce (oz) (28 g) of lean protein is equal to: 1 oz (28 g) of meat, chicken, or fish. 1 egg.  cup (62 g) of tofu. Eat some foods each day that contain healthy fats, such as avocado, nuts, seeds, and fish. What foods should I eat? Fruits Berries. Apples. Oranges. Peaches. Apricots.  Plums. Grapes. Mangoes. Papayas. Pomegranates. Kiwi. Cherries. Vegetables Leafy greens, including lettuce, spinach, kale, chard, collard greens, mustard greens, and cabbage. Beets. Cauliflower. Broccoli. Carrots. Green beans. Tomatoes. Peppers. Onions. Cucumbers. Brussels sprouts. Grains Whole grains, such as whole-wheat or whole-grain bread, crackers, tortillas, cereal, and pasta. Unsweetened oatmeal. Quinoa. Brown or wild rice. Meats and other proteins Seafood. Poultry without skin. Lean cuts of poultry and beef. Tofu. Nuts. Seeds. Dairy Low-fat or fat-free dairy products such as milk, yogurt, and cheese. The items listed above may not be a complete list of foods and beverages  you can eat and drink. Contact a dietitian for more information. What foods should I avoid? Fruits Fruits canned with syrup. Vegetables Canned vegetables. Frozen vegetables with butter or cream sauce. Grains Refined white flour and flour products such as bread, pasta, snack foods, and cereals. Avoid all processed foods. Meats and other proteins Fatty cuts of meat. Poultry with skin. Breaded or fried meats. Processed meat. Avoid saturated fats. Dairy Full-fat yogurt, cheese, or milk. Beverages Sweetened drinks, such as soda or iced tea. The items listed above may not be a complete list of foods and beverages you should avoid. Contact a dietitian for more information. Questions to ask a health care provider Do I need to meet with a certified diabetes care and education specialist? Do I need to meet with a dietitian? What number can I call if I have questions? When are the best times to check my blood glucose? Where to find more information: American Diabetes Association: diabetes.org Academy of Nutrition and Dietetics: eatright.[employer] of Diabetes and Digestive and Kidney Diseases: StageSync.si Association of Diabetes Care & Education Specialists: diabeteseducator.org Summary It is important to have healthy eating habits because your blood sugar (glucose) levels are greatly affected by what you eat and drink. It is important to use alcohol carefully. A healthy meal plan will help you manage your blood glucose and lower your risk of heart disease. Your health care provider may recommend that you work with a dietitian to make a meal plan that is best for you. This information is not intended to replace advice given to you by your health care provider. Make sure you discuss any questions you have with your health care provider. Document Revised: 09/24/2019 Document Reviewed: 09/24/2019 Elsevier Patient Education  2024 [employer].

## 2022-10-19 ENCOUNTER — Other Ambulatory Visit: Payer: Self-pay | Admitting: Family

## 2022-10-19 DIAGNOSIS — E785 Hyperlipidemia, unspecified: Secondary | ICD-10-CM

## 2022-10-19 DIAGNOSIS — J410 Simple chronic bronchitis: Secondary | ICD-10-CM

## 2022-10-19 DIAGNOSIS — E1169 Type 2 diabetes mellitus with other specified complication: Secondary | ICD-10-CM

## 2022-10-19 MED ORDER — INSULIN GLARGINE (2 UNIT DIAL) 300 UNIT/ML ~~LOC~~ SOPN: 12.0000 [IU] | PEN_INJECTOR | Freq: Every evening | SUBCUTANEOUS | 1 refills | Status: DC

## 2022-11-01 ENCOUNTER — Telehealth: Payer: Self-pay

## 2022-11-01 NOTE — Progress Notes (Signed)
   Care Guide Note  11/01/2022 Name: Ryan Mcintyre MRN: [id number] DOB: 1955/03/15  Referred by: Junie Spencer, FNP Reason for referral : Care Coordination (Outreach to schedule with Pharm d )   Ryan Mcintyre is a 67 y.o. year old male who is a primary care patient of Junie Spencer, FNP. Ryan Mcintyre was referred to the pharmacist for assistance related to DM.    An unsuccessful telephone outreach was attempted today to contact the patient who was referred to the pharmacy team for assistance with medication management. Additional attempts will be made to contact the patient.   Penne Lash, RMA Care Guide [facility]  [place], [place] [address] Direct Dial: [phone] [email or web address]

## 2022-11-02 DIAGNOSIS — Z008 Encounter for other general examination: Secondary | ICD-10-CM | POA: Diagnosis not present

## 2022-11-02 DIAGNOSIS — E119 Type 2 diabetes mellitus without complications: Secondary | ICD-10-CM | POA: Diagnosis not present

## 2022-11-03 ENCOUNTER — Encounter: Payer: Self-pay | Admitting: *Deleted

## 2022-11-03 ENCOUNTER — Other Ambulatory Visit: Payer: No Typology Code available for payment source | Admitting: *Deleted

## 2022-11-03 ENCOUNTER — Telehealth: Payer: No Typology Code available for payment source

## 2022-11-03 NOTE — Patient Outreach (Signed)
Care Management   Visit Note  11/03/2022 Name: Ryan Mcintyre MRN: [id number] DOB: 12/31/1955  Subjective: Ryan Mcintyre is a 67 y.o. year old male who is a primary care patient of Ryan Spencer, FNP. The Care Management team was consulted for assistance.      Engaged with patient spoke with patient by telephone for follow up   Goals Addressed             This Visit's Progress    CCM (COPD) EXPECTED OUTCOME: MONITOR, SELF-MANAGE AND REDUCE SYMPTOMS OF COPD       Current Barriers:  Knowledge Deficits related to COPD management Chronic Disease Management support and education needs related to COPD/ action plan No Advanced Directives in place- information previously mailed Patient reports he lives with spouse, is independent in all aspects of his care, continues to drive, states " breathing is ok", continues to get outdoors and exercise, rides bike daily, no new concerns reported  Planned Interventions: Advised patient to track and manage COPD triggers Provided instruction about proper use of medications used for management of COPD including inhalers Advised patient to self assesses COPD action plan zone and make appointment with provider if in the yellow zone for 48 hours without improvement Advised patient to engage in light exercise as tolerated 3-5 days a week to aid in the the management of COPD Provided education about and advised patient to utilize infection prevention strategies to reduce risk of respiratory infection Discussed the importance of adequate rest and management of fatigue with COPD Reinforced energy conservation Reviewed upcoming scheduled appointments  Symptom Management: Take medications as prescribed   Attend all scheduled provider appointments Call pharmacy for medication refills 3-7 days in advance of running out of medications Attend church or other social activities Perform all self care activities independently  Perform IADL's (shopping,  preparing meals, housekeeping, managing finances) independently Call provider office for new concerns or questions  identify and remove indoor air pollutants limit outdoor activity during cold weather listen for public air quality announcements every day develop a rescue plan eliminate symptom triggers at home follow rescue plan if symptoms flare-up keep follow-up appointments: primary care provider 04/03/22 at 1040 am don't eat or exercise right before bedtime eat healthy/prescribed diet: heart healthy, carbohydrate modified get at least 7 to 8 hours of sleep at night do breathing exercises every day Follow COPD action- call your doctor early on for change in health status, symptoms Alternate activity with rest  Follow Up Plan: Telephone follow up appointment with care management team member scheduled for:   01/16/23 at 9 am       CCM (DIABETES) EXPECTED OUTCOME:  MONITOR, SELF-MANAGE AND REDUCE SYMPTOMS OF DIABETES       Current Barriers:  Knowledge Deficits related to Diabetes management Chronic Disease Management support and education needs related to Diabetes, diet No Advanced Directives in place- information previously mailed Patient reports he checks CBG once daily fasting with readings usually under 200 with today's reading 195,  states will continue to work towards goal of AIC <7, recent AIC 9.9 on 10/17/22, states "my diet hasn't been the best, I'm working on it"  Planned Interventions: Counseled on importance of regular laboratory monitoring as prescribed;        Discussed plans with patient for ongoing care management follow up and provided patient with direct contact information for care management team;      Provided patient with written educational materials related to hypo and hyperglycemia and importance  of correct treatment;       Advised patient, providing education and rationale, to check cbg once daily and record        call provider for findings outside established  parameters;       Review of patient status, including review of consultants reports, relevant laboratory and other test results, and medications completed;       Reinforced carbohydrate modified diet and nutritious food choices Reviewed plate method  Symptom Management: Take medications as prescribed   Attend all scheduled provider appointments Call pharmacy for medication refills 3-7 days in advance of running out of medications Attend church or other social activities Perform all self care activities independently  Perform IADL's (shopping, preparing meals, housekeeping, managing finances) independently Call provider office for new concerns or questions  check blood sugar at prescribed times: once daily check feet daily for cuts, sores or redness enter blood sugar readings and medication or insulin into daily log take the blood sugar log to all doctor visits take the blood sugar meter to all doctor visits trim toenails straight across fill half of plate with vegetables limit fast food meals to no more than 1 per week manage portion size read food labels for fat, fiber, carbohydrates and portion size set a realistic goal keep feet up while sitting wash and dry feet carefully every day wear comfortable, cotton socks Follow carbohydrate modified diet/ plate method Continue to exercise daily  Follow Up Plan: Telephone follow up appointment with care management team member scheduled for:    01/16/23 at 9 am         Plan: Telephone follow up appointment with care management team member scheduled for: 01/16/23 at 9 am  Ryan Mcintyre [facility], BSN [place]/ Ambulatory Care Management [phone]

## 2022-11-03 NOTE — Patient Instructions (Signed)
Visit Information  Thank you for taking time to visit with me today. Please don't hesitate to contact me if I can be of assistance to you before our next scheduled telephone appointment.  Following are the goals we discussed today:   Goals Addressed             This Visit's Progress    CCM (COPD) EXPECTED OUTCOME: MONITOR, SELF-MANAGE AND REDUCE SYMPTOMS OF COPD       Current Barriers:  Knowledge Deficits related to COPD management Chronic Disease Management support and education needs related to COPD/ action plan No Advanced Directives in place- information previously mailed Patient reports he lives with spouse, is independent in all aspects of his care, continues to drive, states " breathing is ok", continues to get outdoors and exercise, rides bike daily, no new concerns reported  Planned Interventions: Advised patient to track and manage COPD triggers Provided instruction about proper use of medications used for management of COPD including inhalers Advised patient to self assesses COPD action plan zone and make appointment with provider if in the yellow zone for 48 hours without improvement Advised patient to engage in light exercise as tolerated 3-5 days a week to aid in the the management of COPD Provided education about and advised patient to utilize infection prevention strategies to reduce risk of respiratory infection Discussed the importance of adequate rest and management of fatigue with COPD Reinforced energy conservation Reviewed upcoming scheduled appointments  Symptom Management: Take medications as prescribed   Attend all scheduled provider appointments Call pharmacy for medication refills 3-7 days in advance of running out of medications Attend church or other social activities Perform all self care activities independently  Perform IADL's (shopping, preparing meals, housekeeping, managing finances) independently Call provider office for new concerns or  questions  identify and remove indoor air pollutants limit outdoor activity during cold weather listen for public air quality announcements every day develop a rescue plan eliminate symptom triggers at home follow rescue plan if symptoms flare-up keep follow-up appointments: primary care provider 04/03/22 at 1040 am don't eat or exercise right before bedtime eat healthy/prescribed diet: heart healthy, carbohydrate modified get at least 7 to 8 hours of sleep at night do breathing exercises every day Follow COPD action- call your doctor early on for change in health status, symptoms Alternate activity with rest  Follow Up Plan: Telephone follow up appointment with care management team member scheduled for:   01/16/23 at 9 am       CCM (DIABETES) EXPECTED OUTCOME:  MONITOR, SELF-MANAGE AND REDUCE SYMPTOMS OF DIABETES       Current Barriers:  Knowledge Deficits related to Diabetes management Chronic Disease Management support and education needs related to Diabetes, diet No Advanced Directives in place- information previously mailed Patient reports he checks CBG once daily fasting with readings usually under 200 with today's reading 195,  states will continue to work towards goal of AIC <7, recent AIC 9.9 on 10/17/22, states "my diet hasn't been the best, I'm working on it"  Planned Interventions: Counseled on importance of regular laboratory monitoring as prescribed;        Discussed plans with patient for ongoing care management follow up and provided patient with direct contact information for care management team;      Provided patient with written educational materials related to hypo and hyperglycemia and importance of correct treatment;       Advised patient, providing education and rationale, to check cbg once daily and record  call provider for findings outside established parameters;       Review of patient status, including review of consultants reports, relevant laboratory  and other test results, and medications completed;       Reinforced carbohydrate modified diet and nutritious food choices Reviewed plate method  Symptom Management: Take medications as prescribed   Attend all scheduled provider appointments Call pharmacy for medication refills 3-7 days in advance of running out of medications Attend church or other social activities Perform all self care activities independently  Perform IADL's (shopping, preparing meals, housekeeping, managing finances) independently Call provider office for new concerns or questions  check blood sugar at prescribed times: once daily check feet daily for cuts, sores or redness enter blood sugar readings and medication or insulin into daily log take the blood sugar log to all doctor visits take the blood sugar meter to all doctor visits trim toenails straight across fill half of plate with vegetables limit fast food meals to no more than 1 per week manage portion size read food labels for fat, fiber, carbohydrates and portion size set a realistic goal keep feet up while sitting wash and dry feet carefully every day wear comfortable, cotton socks Follow carbohydrate modified diet/ plate method Continue to exercise daily  Follow Up Plan: Telephone follow up appointment with care management team member scheduled for:    01/16/23 at 9 am         Our next appointment is by telephone on 01/16/23 at 9 am  Please call the care guide team at [phone] if you need to cancel or reschedule your appointment.   If you are experiencing a Mental Health or Behavioral Health Crisis or need someone to talk to, please call the Suicide and Crisis Lifeline: 988 call the [place] National Suicide Prevention Lifeline: (812)664-0193 or TTY: 279-826-0725 TTY 539-094-6633) to talk to a trained counselor call 1-800-273-TALK (toll free, 24 hour hotline) go to [facility] [address] [place]  [phone]) call the [facility] Crisis Line: ([phone] call 911   Patient verbalizes understanding of instructions and care plan provided today and agrees to view in MyChart. Active MyChart status and patient understanding of how to access instructions and care plan via MyChart confirmed with patient.     Telephone follow up appointment with care management team member scheduled for: 01/16/23 at 9 am  Irving Shows [facility], BSN [facility]/ Ambulatory Care Management [phone]

## 2022-11-08 NOTE — Progress Notes (Signed)
   Care Guide Note  11/08/2022 Name: Ryan Mcintyre MRN: [id number] DOB: 03/01/1956  Referred by: Junie Spencer, FNP Reason for referral : Care Coordination (Outreach to schedule with Pharm d )   Ryan Mcintyre is a 67 y.o. year old male who is a primary care patient of Junie Spencer, FNP. Ryan Mcintyre was referred to the pharmacist for assistance related to HLD and DM.    A second unsuccessful telephone outreach was attempted today to contact the patient who was referred to the pharmacy team for assistance with medication management. Additional attempts will be made to contact the patient.  Penne Lash, RMA Care Guide [facility]  [place], [place] [address] Direct Dial: [phone] [email or web address]

## 2022-11-10 NOTE — Progress Notes (Signed)
   Care Guide Note  11/10/2022 Name: Ryan Mcintyre MRN: [id number] DOB: [birth year]  Referred by: Junie Spencer, FNP Reason for referral : Care Coordination (Outreach to schedule with Pharm d )   Ryan Mcintyre is a 67 y.o. year old male who is a primary care patient of Junie Spencer, FNP. Ryan Mcintyre was referred to the pharmacist for assistance related to HLD and DM.    Successful contact was made with the patient to discuss pharmacy services including being ready for the pharmacist to call at least 5 minutes before the scheduled appointment time, to have medication bottles and any blood sugar or blood pressure readings ready for review. The patient agreed to meet with the pharmacist via with the pharmacist via telephone visit on (date/time).  12/14/2022   Penne Lash, RMA Care Guide [facility]  [place], [place] [address] Direct Dial: [phone] [email or web address]

## 2022-12-12 NOTE — Progress Notes (Unsigned)
   12/12/2022 Name: Ryan Mcintyre MRN: [id number] DOB: 1956/02/06  No chief complaint on file.   Ryan Mcintyre is a 67 y.o. year old male who was referred for medication management by their primary care provider, Junie Spencer, FNP. They presented for a face to face visit today.   They were referred to the pharmacist by their PCP for assistance in managing diabetes. He was last seen by clinic pharmacist in January 2024.   Subjective:  Care Team: Primary Care Provider: Junie Spencer, FNP ; Next Scheduled Visit: 12/26/2022  Medication Access/Adherence  Current Pharmacy:  [facility] [address] [place] - [address] [place] [address] Phone: [phone] Fax: [phone]  [facility] - [place], [place] - [address] [place] [address] Phone: [phone] Fax: [phone]   Patient reports affordability concerns with their medications: {YES/NO:21197} Patient reports access/transportation concerns to their pharmacy: {YES/NO:21197} Patient reports adherence concerns with their medications:  {YES/NO:21197} ***   Diabetes:  Current medications:  Metformin 1000 mg twice daily  Glimepiride 4 mg daily before breakfast (still taking? Not filled since 2023) Farxiga 10 mg daily  Ozempic dose?-last charted dose was 0.5 mg weekly   Medications tried in the past:  Insulin glargine 12 units at bedtime  Jardiance 10 mg daily (2019-2023) Januvia 100 mg daily (2018)  Current glucose readings: *** He uses Freestyle Libre 3 CGM system with reader   @CGMDOWNLOAD @  Patient {Actions; denies-reports:120008} hypoglycemic s/sx including ***dizziness, shakiness, sweating. Patient {Actions; denies-reports:120008} hyperglycemic symptoms including ***polyuria, polydipsia, polyphagia, nocturia, neuropathy, blurred vision.  Current meal patterns:  - Breakfast: *** - Lunch *** - Supper *** - Snacks *** - Drinks ***  Current  physical activity: ***  Current medication access support: AZ&me??   Objective:  Lab Results  Component Value Date   HGBA1C 9.9 (H) 10/17/2022    Lab Results  Component Value Date   CREATININE 1.00 10/17/2022   BUN 16 10/17/2022   NA 141 10/17/2022   K 4.8 10/17/2022   CL 102 10/17/2022   CO2 26 10/17/2022    Lab Results  Component Value Date   CHOL 158 10/17/2022   HDL 30 (L) 10/17/2022   LDLCALC 95 10/17/2022   LDLDIRECT 83 12/30/2014   TRIG 192 (H) 10/17/2022   CHOLHDL 5.3 (H) 10/17/2022    Medications Reviewed Today   Medications were not reviewed in this encounter       Assessment/Plan:   Diabetes: - Currently uncontrolled. A1c 9.9 (goal <7). This is much increased from March 2024 (7.3). - Reviewed long term cardiovascular and renal outcomes of uncontrolled blood sugar - Reviewed goal A1c, goal fasting, and goal 2 hour post prandial glucose - Reviewed dietary modifications including *** - Reviewed lifestyle modifications including: - Recommend to ***  - Recommend to check glucose *** - Meets financial criteria for *** patient assistance program through ***. Will collaborate with provider, CPhT, and patient to pursue assistance.     Follow Up Plan: ***  Sofie Rower, PharmD [facility] PGY1

## 2022-12-14 ENCOUNTER — Ambulatory Visit: Payer: No Typology Code available for payment source | Admitting: Pharmacist

## 2022-12-14 DIAGNOSIS — Z7984 Long term (current) use of oral hypoglycemic drugs: Secondary | ICD-10-CM

## 2022-12-14 DIAGNOSIS — E119 Type 2 diabetes mellitus without complications: Secondary | ICD-10-CM

## 2022-12-14 MED ORDER — DAPAGLIFLOZIN PROPANEDIOL 10 MG PO TABS
10.0000 mg | ORAL_TABLET | Freq: Every day | ORAL | 6 refills | Status: DC
Start: 2022-12-14 — End: 2023-03-15

## 2022-12-16 ENCOUNTER — Other Ambulatory Visit: Payer: Self-pay | Admitting: Family

## 2022-12-16 DIAGNOSIS — E119 Type 2 diabetes mellitus without complications: Secondary | ICD-10-CM

## 2022-12-26 ENCOUNTER — Encounter: Payer: Self-pay | Admitting: Family

## 2022-12-26 ENCOUNTER — Other Ambulatory Visit: Payer: Self-pay | Admitting: Family

## 2022-12-26 ENCOUNTER — Ambulatory Visit (INDEPENDENT_AMBULATORY_CARE_PROVIDER_SITE_OTHER): Payer: No Typology Code available for payment source | Admitting: Family

## 2022-12-26 VITALS — BP 153/88 | HR 75 | Temp 98.0°F | Ht 66.0 in | Wt 175.0 lb

## 2022-12-26 DIAGNOSIS — E162 Hypoglycemia, unspecified: Secondary | ICD-10-CM | POA: Diagnosis not present

## 2022-12-26 DIAGNOSIS — I152 Hypertension secondary to endocrine disorders: Secondary | ICD-10-CM

## 2022-12-26 DIAGNOSIS — E1159 Type 2 diabetes mellitus with other circulatory complications: Secondary | ICD-10-CM

## 2022-12-26 DIAGNOSIS — E1169 Type 2 diabetes mellitus with other specified complication: Secondary | ICD-10-CM | POA: Diagnosis not present

## 2022-12-26 DIAGNOSIS — E119 Type 2 diabetes mellitus without complications: Secondary | ICD-10-CM

## 2022-12-26 LAB — BAYER DCA HB A1C WAIVED: HB A1C (BAYER DCA - WAIVED): 7.2 % — ABNORMAL HIGH (ref 4.8–5.6)

## 2022-12-26 MED ORDER — GLIMEPIRIDE 2 MG PO TABS
2.0000 mg | ORAL_TABLET | Freq: Every day | ORAL | 1 refills | Status: DC
Start: 2022-12-26 — End: 2023-06-26

## 2022-12-26 MED ORDER — LISINOPRIL 20 MG PO TABS
20.0000 mg | ORAL_TABLET | Freq: Every day | ORAL | 3 refills | Status: DC
Start: 2022-12-26 — End: 2023-12-17

## 2022-12-26 NOTE — Progress Notes (Signed)
Subjective:    Patient ID: Ryan Mcintyre, male    DOB: 1955-11-08, 67 y.o.   MRN: [id number]  Chief Complaint  Patient presents with   Diabetes    SUGAR IS GOING ALL OVER THIS PLACE LOWEST 53 THIS WAS YESTERDAY. HIGHEST 203 THIS MORNING    Pt presents to the office today to follow up on uncontrolled DM. He was seen on 10/17/22 and found to have an A1C 9.9. We increased his Toujeo to 12 units, but he stopped this because it made him sick.   He reports he has had lows of 63, 53 over the last several days.  Diabetes He presents for his follow-up diabetic visit. He has type 2 diabetes mellitus. Pertinent negatives for diabetes include no blurred vision and no foot paresthesias. Symptoms are stable. Risk factors for coronary artery disease include dyslipidemia, diabetes mellitus, hypertension, male sex and sedentary lifestyle. His weight is fluctuating dramatically.  Hypertension This is a chronic problem. The current episode started more than 1 year ago. The problem has been waxing and waning since onset. The problem is uncontrolled. Associated symptoms include malaise/fatigue. Pertinent negatives include no blurred vision, peripheral edema or shortness of breath. Risk factors for coronary artery disease include dyslipidemia, diabetes mellitus, obesity and sedentary lifestyle. The current treatment provides no improvement.      Review of Systems  Constitutional:  Positive for malaise/fatigue.  Eyes:  Negative for blurred vision.  Respiratory:  Negative for shortness of breath.   All other systems reviewed and are negative.      Objective:   Physical Exam Vitals reviewed.  Constitutional:      General: He is not in acute distress.    Appearance: He is well-developed.  HENT:     Head: Normocephalic.     Right Ear: Tympanic membrane normal.     Left Ear: Tympanic membrane normal.  Eyes:     General:        Right eye: No discharge.        Left eye: No discharge.     Pupils:  Pupils are equal, round, and reactive to light.  Neck:     Thyroid: No thyromegaly.  Cardiovascular:     Rate and Rhythm: Normal rate and regular rhythm.     Heart sounds: Normal heart sounds. No murmur heard. Pulmonary:     Effort: Pulmonary effort is normal. No respiratory distress.     Breath sounds: Normal breath sounds. No wheezing.  Abdominal:     General: Bowel sounds are normal. There is no distension.     Palpations: Abdomen is soft.     Tenderness: There is no abdominal tenderness.  Musculoskeletal:        General: No tenderness. Normal range of motion.     Cervical back: Normal range of motion and neck supple.  Skin:    General: Skin is warm and dry.     Findings: No erythema or rash.  Neurological:     Mental Status: He is alert and oriented to person, place, and time.     Cranial Nerves: No cranial nerve deficit.     Deep Tendon Reflexes: Reflexes are normal and symmetric.  Psychiatric:        Behavior: Behavior normal.        Thought Content: Thought content normal.        Judgment: Judgment normal.       BP (!) 150/51   Pulse 75   Temp 98 F (36.7  C) (Temporal)   Ht 5\' 6"  (1.676 m)   Wt 175 lb (79.4 kg)   SpO2 97%   BMI 28.25 kg/m      Assessment & Plan:  ERIKA WOOSTER comes in today with chief complaint of Diabetes (SUGAR IS GOING ALL OVER THIS PLACE LOWEST 53 THIS WAS YESTERDAY. HIGHEST 203 THIS MORNING )   Diagnosis and orders addressed:  1. Type 2 diabetes mellitus with other specified complication, without long-term current use of insulin (HCC) Will decrease glimepiride to 2 mg from 4 mg related to hypoglycemia Low carb diet  - Bayer DCA Hb A1c Waived - CMP14+EGFR - glimepiride (AMARYL) 2 MG tablet; Take 1 tablet (2 mg total) by mouth daily with breakfast.  Dispense: 90 tablet; Refill: 1  2. Hypertension associated with diabetes (HCC) Start lisinopril 20 mg  -Daily blood pressure log given with instructions on how to fill out and told to  bring to next visit -Dash diet information given -Exercise encouraged - Stress Management  -Continue current meds -RTO in 2 weeks  - lisinopril (ZESTRIL) 20 MG tablet; Take 1 tablet (20 mg total) by mouth daily.  Dispense: 90 tablet; Refill: 3 - CMP14+EGFR  3. Hypoglycemia Will decrease glimepiride to 2 mg from 4 mg related to hypoglycemia   Labs pending Will decrease glimepiride to 2 mg from 4 mg related to hypoglycemia Start lisinopril 20 mg  Health Maintenance reviewed Diet and exercise encouraged  Follow up plan: 2 weeks to recheck HTN   Jannifer Rodney, FNP

## 2022-12-26 NOTE — Patient Instructions (Signed)
Diabetes Mellitus Basics  Diabetes mellitus, or diabetes, is a long-term (chronic) disease. It occurs when the body does not properly use sugar (glucose) that is released from food after you eat. Diabetes mellitus may be caused by one or both of these problems: Your pancreas does not make enough of a hormone called insulin. Your body does not react in a normal way to the insulin that it makes. Insulin lets glucose enter cells in your body. This gives you energy. If you have diabetes, glucose cannot get into cells. This causes high blood glucose (hyperglycemia). How to treat and manage diabetes You may need to take insulin or other diabetes medicines daily to keep your glucose in balance. If you are prescribed insulin, you will learn how to give yourself insulin by injection. You may need to adjust the amount of insulin you take based on the foods that you eat. You will need to check your blood glucose levels using a glucose monitor as told by your health care provider. The readings can help determine if you have low or high blood glucose. Generally, you should have these blood glucose levels: Before meals (preprandial): 80-130 mg/dL (4.4-7.2 mmol/L). After meals (postprandial): below 180 mg/dL (10 mmol/L). Hemoglobin A1c (HbA1c) level: less than 7%. Your health care provider will set treatment goals for you. Keep all follow-up visits. This is important. Follow these instructions at home: Diabetes medicines Take your diabetes medicines every day as told by your health care provider. List your diabetes medicines here: Name of medicine: ______________________________ Amount (dose): _______________ Time (a.m./p.m.): _______________ Notes: ___________________________________ Name of medicine: ______________________________ Amount (dose): _______________ Time (a.m./p.m.): _______________ Notes: ___________________________________ Name of medicine: ______________________________ Amount (dose):  _______________ Time (a.m./p.m.): _______________ Notes: ___________________________________ Insulin If you use insulin, list the types of insulin you use here: Insulin type: ______________________________ Amount (dose): _______________ Time (a.m./p.m.): _______________Notes: ___________________________________ Insulin type: ______________________________ Amount (dose): _______________ Time (a.m./p.m.): _______________ Notes: ___________________________________ Insulin type: ______________________________ Amount (dose): _______________ Time (a.m./p.m.): _______________ Notes: ___________________________________ Insulin type: ______________________________ Amount (dose): _______________ Time (a.m./p.m.): _______________ Notes: ___________________________________ Insulin type: ______________________________ Amount (dose): _______________ Time (a.m./p.m.): _______________ Notes: ___________________________________ Managing blood glucose  Check your blood glucose levels using a glucose monitor as told by your health care provider. Write down the times that you check your glucose levels here: Time: _______________ Notes: ___________________________________ Time: _______________ Notes: ___________________________________ Time: _______________ Notes: ___________________________________ Time: _______________ Notes: ___________________________________ Time: _______________ Notes: ___________________________________ Time: _______________ Notes: ___________________________________  Low blood glucose Low blood glucose (hypoglycemia) is when glucose is at or below 70 mg/dL (3.9 mmol/L). Symptoms may include: Feeling: Hungry. Sweaty and clammy. Irritable or easily upset. Dizzy. Sleepy. Having: A fast heartbeat. A headache. A change in your vision. Numbness around the mouth, lips, or tongue. Having trouble with: Moving (coordination). Sleeping. Treating low blood glucose To treat low blood  glucose, eat or drink something containing sugar right away. If you can think clearly and swallow safely, follow the 15:15 rule: Take 15 grams of a fast-acting carb (carbohydrate), as told by your health care provider. Some fast-acting carbs are: Glucose tablets: take 3-4 tablets. Hard candy: eat 3-5 pieces. Fruit juice: drink 4 oz (120 mL). Regular (not diet) soda: drink 4-6 oz (120-180 mL). Honey or sugar: eat 1 Tbsp (15 mL). Check your blood glucose levels 15 minutes after you take the carb. If your glucose is still at or below 70 mg/dL (3.9 mmol/L), take 15 grams of a carb again. If your glucose does not go above 70 mg/dL (3.9 mmol/L) after   3 tries, get help right away. After your glucose goes back to normal, eat a meal or a snack within 1 hour. Treating very low blood glucose If your glucose is at or below 54 mg/dL (3 mmol/L), you have very low blood glucose (severe hypoglycemia). This is an emergency. Do not wait to see if the symptoms will go away. Get medical help right away. Call your local emergency services (911 in the [place] Do not drive yourself to the hospital. Questions to ask your health care provider Should I talk with a diabetes educator? What equipment will I need to care for myself at home? What diabetes medicines do I need? When should I take them? How often do I need to check my blood glucose levels? What number can I call if I have questions? When is my follow-up visit? Where can I find a support group for people with diabetes? Where to find more information American Diabetes Association: www.diabetes.org Association of Diabetes Care and Education Specialists: www.diabeteseducator.org Contact a health care provider if: Your blood glucose is at or above 240 mg/dL (13.3 mmol/L) for 2 days in a row. You have been sick or have had a fever for 2 days or more, and you are not getting better. You have any of these problems for more than 6 hours: You cannot eat or  drink. You feel nauseous. You vomit. You have diarrhea. Get help right away if: Your blood glucose is lower than 54 mg/dL (3 mmol/L). You get confused. You have trouble thinking clearly. You have trouble breathing. These symptoms may represent a serious problem that is an emergency. Do not wait to see if the symptoms will go away. Get medical help right away. Call your local emergency services (911 in the [place] Do not drive yourself to the hospital. Summary Diabetes mellitus is a chronic disease that occurs when the body does not properly use sugar (glucose) that is released from food after you eat. Take insulin and diabetes medicines as told. Check your blood glucose every day, as often as told. Keep all follow-up visits. This is important. This information is not intended to replace advice given to you by your health care provider. Make sure you discuss any questions you have with your health care provider. Document Revised: 06/24/2019 Document Reviewed: 06/24/2019 Elsevier Patient Education  2024 Elsevier Inc.  

## 2022-12-26 NOTE — Addendum Note (Signed)
Addended by: Austin Miles F on: 12/26/2022 11:21 AM   Modules accepted: Orders

## 2022-12-27 LAB — CMP14+EGFR
ALT: 18 [IU]/L (ref 0–44)
AST: 16 [IU]/L (ref 0–40)
Albumin: 4.5 g/dL (ref 3.9–4.9)
Alkaline Phosphatase: 77 [IU]/L (ref 44–121)
BUN/Creatinine Ratio: 18 (ref 10–24)
BUN: 17 mg/dL (ref 8–27)
Bilirubin Total: 0.3 mg/dL (ref 0.0–1.2)
CO2: 26 mmol/L (ref 20–29)
Calcium: 9.3 mg/dL (ref 8.6–10.2)
Chloride: 101 mmol/L (ref 96–106)
Creatinine, Ser: 0.96 mg/dL (ref 0.76–1.27)
Globulin, Total: 2.5 g/dL (ref 1.5–4.5)
Glucose: 107 mg/dL — ABNORMAL HIGH (ref 70–99)
Potassium: 4.6 mmol/L (ref 3.5–5.2)
Sodium: 140 mmol/L (ref 134–144)
Total Protein: 7 g/dL (ref 6.0–8.5)
eGFR: 86 mL/min/{1.73_m2} (ref >59)

## 2023-01-03 NOTE — Progress Notes (Signed)
01/04/2023 Name: Ryan Mcintyre MRN: [id number] DOB: 08/21/1955  Chief Complaint  Patient presents with   Diabetes    Ryan Mcintyre is a 67 y.o. year old male who presented for a telephone visit.  I connected with  WINTHROP SHANNAHAN on 01/09/23 by telephone and verified that I am speaking with the correct person using two identifiers.  I discussed the limitations of evaluation and management by telemedicine. The patient expressed understanding and agreed to proceed.  Patient was located in her home and PharmD in PCP office during this visit.    They were referred to the pharmacist by their PCP for assistance in managing diabetes. He reports he has had a couple of low BG readings (asymptomatic). He has over all remained well.    Subjective:  Care Team: Primary Care Provider: Junie Spencer, FNP   Medication Access/Adherence  Current Pharmacy:  [facility] [address] [place] - [address] [place] [address] Phone: [phone] Fax: [phone]  [facility] - [place], [place] - [address] [place] [address] Phone: [phone] Fax: [phone]   Patient reports affordability concerns with their medications: Yes  Patient reports access/transportation concerns to their pharmacy: No  Patient reports adherence concerns with their medications:  No     Diabetes:  Current medications:  Metformin 1000 mg twice daily  Glimepiride 2 mg daily before breakfast (reduced from 4 mg to 2 mg) Farxiga 10 mg daily    Medications tried in the past:  Insulin glargine 12 units at bedtime     Jardiance 10 mg daily (2019-2023) Januvia 100 mg daily (2018) Ozempic (diarrhea, stomach ache)  He uses Freestyle Libre 3 CGM system with reader.  BG has been <120; average of around 114-118. Monday/Tuesday dropped down to 59-60, drank sweet to tea to bring it up. Low BG episodes occurs in the morning or afternoons.   Patient denies  hypoglycemic s/sx including dizziness, shakiness, sweating. Patient denies hyperglycemic symptoms including polyuria, polydipsia, polyphagia, nocturia, neuropathy, blurred vision.  Current medication access support: AZ&me Marcelline Deist)   Objective:  Lab Results  Component Value Date   HGBA1C 7.2 (H) 12/26/2022    Lab Results  Component Value Date   CREATININE 0.96 12/26/2022   BUN 17 12/26/2022   NA 140 12/26/2022   K 4.6 12/26/2022   CL 101 12/26/2022   CO2 26 12/26/2022    Lab Results  Component Value Date   CHOL 158 10/17/2022   HDL 30 (L) 10/17/2022   LDLCALC 95 10/17/2022   LDLDIRECT 83 12/30/2014   TRIG 192 (H) 10/17/2022   CHOLHDL 5.3 (H) 10/17/2022    Medications Reviewed Today     Reviewed by Gwenlyn Found, RPH (Pharmacist) on 01/04/23 at 1110  Med List Status: <None>   Medication Order Taking? Sig Documenting Provider Last Dose Status Informant  aspirin EC 81 MG tablet [id number]  Take 1 tablet (81 mg total) by mouth daily. Swallow whole. Junie Spencer, FNP  Active   blood glucose meter kit and supplies [id number]  Dispense based on patient and insurance preference. Use up to four times daily as directed. (FOR ICD-10 E10.9, E11.9). Jannifer Rodney A, FNP  Active   dapagliflozin propanediol (FARXIGA) 10 MG TABS tablet [id number] Yes Take 1 tablet (10 mg total) by mouth daily before breakfast. Junie Spencer, FNP Taking Active   glimepiride (AMARYL) 2 MG tablet [id number] Yes Take  1 tablet (2 mg total) by mouth daily with breakfast. Junie Spencer, FNP Taking Active   glucose blood test strip [id number]  Test daily. Jannifer Rodney A, FNP  Active   lisinopril (ZESTRIL) 20 MG tablet [id number] Yes Take 1 tablet (20 mg total) by mouth daily. Jannifer Rodney A, FNP Taking Active   metFORMIN (GLUCOPHAGE) 1000 MG tablet [id number] Yes TAKE 1 TABLET BY MOUTH TWICE DAILY WITH MEALS Junie Spencer, FNP Taking Active   OMEPRAZOLE PO [id number]  Take by mouth. [provider]  Active   rosuvastatin (CRESTOR) 5 MG tablet [id number] Yes Take 1 tablet (5 mg total) by mouth daily. Junie Spencer, FNP Taking Active   sildenafil (VIAGRA) 100 MG tablet [id number]  Take 0.5-1 tablets (50-100 mg total) by mouth daily as needed for erectile dysfunction. Jannifer Rodney A, FNP  Active               Assessment/Plan:   Diabetes: - Currently uncontrolled, but very close to goal at 7.2 (goal <7). Previous A1C 9.9. uACr 8; eGFR 86. His current BG averages are <120. He reports 2 incidents where BG <70, but denied signs/symptoms of hypoglycemia. He was able to resolve low BG with sweet tea. -He was advised to keep a snack on him throughout the day to help prevent low BG and discontinue Glimperide.  -He will continue metformin and Farxiga   - Due to significant decrease in A1c in 2 months we will hold off on considering oral GLP (Rybelsus) or injectable (Victoza).  -Increase rosuvastatin 5 mg to 10 mg for cholesterol  -Reviewed long term cardiovascular and renal outcomes of uncontrolled blood sugar - Reviewed goal A1c, goal fasting, and goal 2 hour post prandial glucose - Recommend to check glucose at least once daily before first meal.  - Meets financial criteria for [place]& [place] patient assistance program through 2024. Will collaborate with provider, CPhT, and patient to pursue continued assistance for 2025    Follow Up Plan: Will follow up with clinic in 6-8 weeks to assess hypoglycemia.   Sofie Rower, PharmD [facility] PGY1  Kieth Brightly, PharmD, BCACP, CPP Clinical Pharmacist, [facility]

## 2023-01-04 ENCOUNTER — Other Ambulatory Visit: Payer: No Typology Code available for payment source

## 2023-01-04 DIAGNOSIS — Z7984 Long term (current) use of oral hypoglycemic drugs: Secondary | ICD-10-CM | POA: Diagnosis not present

## 2023-01-04 DIAGNOSIS — Z794 Long term (current) use of insulin: Secondary | ICD-10-CM

## 2023-01-04 DIAGNOSIS — E119 Type 2 diabetes mellitus without complications: Secondary | ICD-10-CM | POA: Diagnosis not present

## 2023-01-08 ENCOUNTER — Other Ambulatory Visit: Payer: No Typology Code available for payment source

## 2023-01-08 ENCOUNTER — Other Ambulatory Visit: Payer: Self-pay

## 2023-01-08 NOTE — Patient Instructions (Signed)
Visit Information  Thank you for taking time to visit with me today. Please don't hesitate to contact me if I can be of assistance to you before our next scheduled telephone appointment.  Following are the goals we discussed today:   Goals Addressed             This Visit's Progress    RNCM Care Management  EXPECTED OUTCOME: MONITOR, SELF-MANAGE AND REDUCE SYMPTOMS OF COPD       Current Barriers:  Knowledge Deficits related to COPD management Chronic Disease Management support and education needs related to COPD/ action plan No Advanced Directives in place- information previously mailed Patient reports he lives with spouse, is independent in all aspects of his care, continues to drive, states " breathing is ok", continues to get outdoors and exercise, rides bike daily, no new concerns reported  Planned Interventions: Advised patient to track and manage COPD triggers. Review of monitoring for changes in his breathing with the weather fluctuations and environmental changes. The patient denies any acute findings today. Provided instruction about proper use of medications used for management of COPD including inhalers Advised patient to self assesses COPD action plan zone and make appointment with provider if in the yellow zone for 48 hours without improvement. The patient has a pulse oximeter and says his oxygenation level is good. The patient states that he had COVID about 6 months ago and is doing good now. Denies any long term effects from COVID.   Advised patient to engage in light exercise as tolerated 3-5 days a week to aid in the the management of COPD. The patient is active an independent. Denies any shortness of breath on exacerbation.  Provided education about and advised patient to utilize infection prevention strategies to reduce risk of respiratory infection Discussed the importance of adequate rest and management of fatigue with COPD Reinforced energy conservation Reviewed  upcoming scheduled appointments. Sees pcp on 01-12-2023 at 155 pm Review of how to contact the [facility]. Direct number given.   Symptom Management: Take medications as prescribed   Attend all scheduled provider appointments Call pharmacy for medication refills 3-7 days in advance of running out of medications Attend church or other social activities Perform all self care activities independently  Perform IADL's (shopping, preparing meals, housekeeping, managing finances) independently Call provider office for new concerns or questions  identify and remove indoor air pollutants limit outdoor activity during cold weather listen for public air quality announcements every day develop a rescue plan eliminate symptom triggers at home follow rescue plan if symptoms flare-up keep follow-up appointments: primary care provider 04/03/22 at 1040 am don't eat or exercise right before bedtime eat healthy/prescribed diet: heart healthy, carbohydrate modified get at least 7 to 8 hours of sleep at night do breathing exercises every day Follow COPD action- call your doctor early on for change in health status, symptoms Alternate activity with rest  Follow Up Plan: Telephone follow up appointment with care management team member scheduled for:   03-05-2023 at 1030 am       RNCM Care Management: EXPECTED OUTCOME:  MONITOR, SELF-MANAGE AND REDUCE SYMPTOMS OF DIABETES       Current Barriers:  Knowledge Deficits related to Diabetes management Chronic Disease Management support and education needs related to Diabetes, diet No Advanced Directives in place- information previously mailed A1C above goal currently at 7.2% was 9.9 on 10-17-2022 Lab Results  Component Value Date   HGBA1C 7.2 (H) 12/26/2022     Planned Interventions: Counseled  on importance of regular laboratory monitoring as prescribed. Patient has labs on a regular basis. Is doing well with managing his DM at this time. Knows goal of A1C <7.0;         Discussed plans with patient for ongoing care management follow up and provided patient with direct contact information for care management team;      Provided patient with written educational materials related to hypo and hyperglycemia and importance of correct treatment. Denies any acute highs or lows. Was having lows in the 50's but glipizide has been stopped. The patient states that he has not had any lows since stopping the glipizide. The highest he has seen is 120 and lowest 95       Advised patient, providing education and rationale, to check cbg once daily and record. The patient checks his blood sugars. At the time of the call his blood sugar was 96.        call provider for findings outside established parameters;       Review of patient status, including review of consultants reports, relevant laboratory and other test results, and medications completed;       Reinforced carbohydrate modified diet and nutritious food choices Reviewed plate method The patient was started recently on Lisinopril. The patient states he has not had any issues. Review of the sx and sx to monitor for for any acute reactions to the medications. The patient works with the pharm D on a regular basis.   Symptom Management: Take medications as prescribed   Attend all scheduled provider appointments Call pharmacy for medication refills 3-7 days in advance of running out of medications Attend church or other social activities Perform all self care activities independently  Perform IADL's (shopping, preparing meals, housekeeping, managing finances) independently Call provider office for new concerns or questions  check blood sugar at prescribed times: once daily check feet daily for cuts, sores or redness enter blood sugar readings and medication or insulin into daily log take the blood sugar log to all doctor visits take the blood sugar meter to all doctor visits trim toenails straight across fill half of  plate with vegetables limit fast food meals to no more than 1 per week manage portion size read food labels for fat, fiber, carbohydrates and portion size set a realistic goal keep feet up while sitting wash and dry feet carefully every day wear comfortable, cotton socks Follow carbohydrate modified diet/ plate method Continue to exercise daily  Follow Up Plan: Telephone follow up appointment with care management team member scheduled for:  03-05-23 at 1030 am         Our next appointment is by telephone on 03-05-2023 at 1030 am  Please call the care guide team at [phone] if you need to cancel or reschedule your appointment.   If you are experiencing a Mental Health or Behavioral Health Crisis or need someone to talk to, please call the Suicide and Crisis Lifeline: 988 call the [place] National Suicide Prevention Lifeline: 4357689794 or TTY: 863-748-5666 TTY (860) 117-4664) to talk to a trained counselor call 1-800-273-TALK (toll free, 24 hour hotline) call the [facility]: ([phone])[phone]   Patient verbalizes understanding of instructions and care plan provided today and agrees to view in MyChart. Active MyChart status and patient understanding of how to access instructions and care plan via MyChart confirmed with patient.       Alto Denver RN, MSN, CCM RN Care Manager  [facility]  Ambulatory Care Management  Direct Number: [phone]

## 2023-01-08 NOTE — Patient Outreach (Signed)
Care Management   Visit Note  01/08/2023 Name: Ryan Mcintyre MRN: [id number] DOB: 09/08/1955  Subjective: Ryan Mcintyre is a 67 y.o. year old male who is a primary care patient of Junie Spencer, FNP. The Care Management team was consulted for assistance.      Engaged with patient spoke with patient by telephone.    Goals Addressed             This Visit's Progress    RNCM Care Management  EXPECTED OUTCOME: MONITOR, SELF-MANAGE AND REDUCE SYMPTOMS OF COPD       Current Barriers:  Knowledge Deficits related to COPD management Chronic Disease Management support and education needs related to COPD/ action plan No Advanced Directives in place- information previously mailed Patient reports he lives with spouse, is independent in all aspects of his care, continues to drive, states " breathing is ok", continues to get outdoors and exercise, rides bike daily, no new concerns reported  Planned Interventions: Advised patient to track and manage COPD triggers. Review of monitoring for changes in his breathing with the weather fluctuations and environmental changes. The patient denies any acute findings today. Provided instruction about proper use of medications used for management of COPD including inhalers Advised patient to self assesses COPD action plan zone and make appointment with provider if in the yellow zone for 48 hours without improvement. The patient has a pulse oximeter and says his oxygenation level is good. The patient states that he had COVID about 6 months ago and is doing good now. Denies any long term effects from COVID.   Advised patient to engage in light exercise as tolerated 3-5 days a week to aid in the the management of COPD. The patient is active an independent. Denies any shortness of breath on exacerbation.  Provided education about and advised patient to utilize infection prevention strategies to reduce risk of respiratory infection Discussed the importance of  adequate rest and management of fatigue with COPD Reinforced energy conservation Reviewed upcoming scheduled appointments. Sees pcp on 01-12-2023 at 155 pm Review of how to contact the [facility] Direct number given.   Symptom Management: Take medications as prescribed   Attend all scheduled provider appointments Call pharmacy for medication refills 3-7 days in advance of running out of medications Attend church or other social activities Perform all self care activities independently  Perform IADL's (shopping, preparing meals, housekeeping, managing finances) independently Call provider office for new concerns or questions  identify and remove indoor air pollutants limit outdoor activity during cold weather listen for public air quality announcements every day develop a rescue plan eliminate symptom triggers at home follow rescue plan if symptoms flare-up keep follow-up appointments: primary care provider 04/03/22 at 1040 am don't eat or exercise right before bedtime eat healthy/prescribed diet: heart healthy, carbohydrate modified get at least 7 to 8 hours of sleep at night do breathing exercises every day Follow COPD action- call your doctor early on for change in health status, symptoms Alternate activity with rest  Follow Up Plan: Telephone follow up appointment with care management team member scheduled for:   03-05-2023 at 1030 am       RNCM Care Management: EXPECTED OUTCOME:  MONITOR, SELF-MANAGE AND REDUCE SYMPTOMS OF DIABETES       Current Barriers:  Knowledge Deficits related to Diabetes management Chronic Disease Management support and education needs related to Diabetes, diet No Advanced Directives in place- information previously mailed A1C above goal currently at 7.2% was 9.9 on  10-17-2022 Lab Results  Component Value Date   HGBA1C 7.2 (H) 12/26/2022     Planned Interventions: Counseled on importance of regular laboratory monitoring as prescribed. Patient has labs on  a regular basis. Is doing well with managing his DM at this time. Knows goal of A1C <7.0;        Discussed plans with patient for ongoing care management follow up and provided patient with direct contact information for care management team;      Provided patient with written educational materials related to hypo and hyperglycemia and importance of correct treatment. Denies any acute highs or lows. Was having lows in the 50's but glipizide has been stopped. The patient states that he has not had any lows since stopping the glipizide. The highest he has seen is 120 and lowest 95       Advised patient, providing education and rationale, to check cbg once daily and record. The patient checks his blood sugars. At the time of the call his blood sugar was 96.        call provider for findings outside established parameters;       Review of patient status, including review of consultants reports, relevant laboratory and other test results, and medications completed;       Reinforced carbohydrate modified diet and nutritious food choices Reviewed plate method The patient was started recently on Lisinopril. The patient states he has not had any issues. Review of the sx and sx to monitor for for any acute reactions to the medications. The patient works with the pharm D on a regular basis.   Symptom Management: Take medications as prescribed   Attend all scheduled provider appointments Call pharmacy for medication refills 3-7 days in advance of running out of medications Attend church or other social activities Perform all self care activities independently  Perform IADL's (shopping, preparing meals, housekeeping, managing finances) independently Call provider office for new concerns or questions  check blood sugar at prescribed times: once daily check feet daily for cuts, sores or redness enter blood sugar readings and medication or insulin into daily log take the blood sugar log to all doctor  visits take the blood sugar meter to all doctor visits trim toenails straight across fill half of plate with vegetables limit fast food meals to no more than 1 per week manage portion size read food labels for fat, fiber, carbohydrates and portion size set a realistic goal keep feet up while sitting wash and dry feet carefully every day wear comfortable, cotton socks Follow carbohydrate modified diet/ plate method Continue to exercise daily  Follow Up Plan: Telephone follow up appointment with care management team member scheduled for:  03-05-23 at 1030 am         Consent to Services:  Patient was given information about care management services, agreed to services, and gave verbal consent to participate.   Plan: Telephone follow up appointment with care management team member scheduled for: 03-05-2023 at 1030 am  Alto Denver RN, MSN, CCM RN Care Manager  [facility]  Ambulatory Care Management  Direct Number: [phone]

## 2023-01-09 ENCOUNTER — Ambulatory Visit: Payer: No Typology Code available for payment source | Admitting: Family

## 2023-01-12 ENCOUNTER — Encounter: Payer: Self-pay | Admitting: Family

## 2023-01-12 ENCOUNTER — Ambulatory Visit (INDEPENDENT_AMBULATORY_CARE_PROVIDER_SITE_OTHER): Payer: No Typology Code available for payment source | Admitting: Family

## 2023-01-12 VITALS — BP 128/76 | HR 84 | Temp 97.2°F | Ht 66.0 in | Wt 173.8 lb

## 2023-01-12 DIAGNOSIS — E1159 Type 2 diabetes mellitus with other circulatory complications: Secondary | ICD-10-CM

## 2023-01-12 DIAGNOSIS — I152 Hypertension secondary to endocrine disorders: Secondary | ICD-10-CM

## 2023-01-12 DIAGNOSIS — E1169 Type 2 diabetes mellitus with other specified complication: Secondary | ICD-10-CM | POA: Diagnosis not present

## 2023-01-12 DIAGNOSIS — Z1211 Encounter for screening for malignant neoplasm of colon: Secondary | ICD-10-CM

## 2023-01-12 NOTE — Patient Instructions (Signed)
Hypoglycemia Hypoglycemia is when the amount of sugar, or glucose, in your blood is too low. Low blood sugar can happen if you have diabetes or if you don't have diabetes. It may be an emergency. What are the causes? Low blood sugar happens most often in people who have diabetes. It may be caused by: Diabetes medicine. Not eating enough, or not eating often enough. Being more active than normal. If you don't have diabetes, you may still get low blood sugar if: There's a tumor in your pancreas. A tumor is a growth of cells that isn't normal. You don't eat enough, or you fast. Fasting is when you don't eat for long periods at a time. You have a bad infection or illness. You have problems after weight loss surgery. You have kidney or liver problems. You take certain medicines. What increases the risk? You're more likely to have low blood sugar if: You have diabetes and take medicine for it. You drink a lot of alcohol. You get sick. What are the signs or symptoms? Mild Hunger or feeling like you may vomit. Sweating and feeling cold to the touch. Feeling dizzy or light-headed. Being sleepy or having trouble sleeping. A headache. Blurry vision. Mood changes. These include feeling worried, nervous, or easily annoyed. Moderate Feeling confused. Changes in the way you act. Weakness. An uneven heartbeat. Very bad Having very low blood sugar is an emergency. It can cause: Fainting. Seizures. A coma. Death. How is this diagnosed?  Low blood sugar can be found with a blood test. This test tells you how much sugar is in your blood. It's done while you're having symptoms. Your health care provider may also do an exam and look at your medical history. How is this treated? Treating low blood sugar If you have low blood sugar, eat or drink something with sugar in it right away. The food or drink should have 15 grams of a fast-acting carbohydrate (carb). Options include: 4 oz (120 mL) of  fruit juice. 4 oz (120 mL) of soda (not diet soda). A few pieces of hard candy. Check food labels to see how many pieces to eat. 1 Tbsp (15 mL) of sugar or honey. 4 glucose tablets. 1 tube of glucose gel. Treating low blood sugar if you have diabetes Talk with your provider about how much carb you should take. If you're alert and can swallow safely, you may follow the 15:15 rule: Take 15 grams of a fast-acting carb. Check your blood sugar 15 minutes after you take the carb. If your blood sugar is still at or below 70 mg/dL (3.9 mmol/L), take 15 grams of a carb again. If your blood sugar doesn't go above 70 mg/dL (3.9 mmol/L) after 3 tries, get help right away. After your blood sugar goes back to normal, eat a meal or a snack within 1 hour. Always keep 15 grams of a fast-acting carb with you. This could be: 4 glucose tablets. A few pieces of hard candy. 1 Tbsp (15 mL) of honey or sugar. 1 tube of glucose gel. Treating very low blood sugar If your blood sugar is less than 54 mg/dL (3 mmol/L), it's an emergency. Get help right away. If you can't eat or drink, you will need to be given glucagon. A family member or friend should learn how to check your blood sugar and give you glucagon. Ask your provider if you should keep a glucagon kit at home. You may also need to be treated in a hospital. Follow  these instructions at home: If you have diabetes: Always keep a fast-acting carb (15 grams) with you. Follow your diabetes care plan. Make sure you: Know the symptoms of low blood sugar. Check your blood sugar as often as told. Always check it before and after you exercise. Always check your blood sugar before you drive. Take your medicines as told. Eat on time. Do not skip meals. Share your diabetes care plan with: Your work or school. The people you live with. Wear an alert bracelet or carry a card that says you have diabetes. General instructions If you drink alcohol: Limit how much you  have to: 0-1 drink a day if you're male. 0-2 drinks a day if you're male. Know how much alcohol is in your drink. In the [place] one drink is one 12 oz bottle of beer (355 mL), one 5 oz glass of wine (148 mL), or one 1 oz glass of hard liquor (44 mL). Be sure to eat food when you drink alcohol. Be sure to check your blood sugar after you drink. Alcohol may lead to low blood sugar later. Where to find more information American Diabetes Association (ADA): diabetes.org Contact a health care provider if: You have low blood sugar often. You have diabetes and are having trouble keeping your blood sugar in the right range. Get help right away if: You can't get your blood sugar above 70 mg/dL (3.9 mmol/L) after 3 tries. Your blood sugar is below 54 mg/dL (3 mmol/L). You have a seizure. You faint. These symptoms may be an emergency. Call 911 right away. Do not wait to see if the symptoms will go away. Do not drive yourself to the hospital. This information is not intended to replace advice given to you by your health care provider. Make sure you discuss any questions you have with your health care provider. Document Revised: 05/10/2022 Document Reviewed: 05/10/2022 Elsevier Patient Education  2024 [employer].

## 2023-01-12 NOTE — Progress Notes (Signed)
Subjective:    Patient ID: Ryan Mcintyre, male    DOB: 26-May-1955, 67 y.o.   MRN: [id number]  Chief Complaint  Patient presents with   Diabetes   PT presents to the office today to follow up on HTN and DM. He was seen on 12/26/22 and we added Lisinopril 20 mg daily.  His BP is at goal today.   He was complaining of hypoglycemia and we decreased his glimepiride to 2 mg from 4 mg. Denies any more lows since decreasing. Reports lowest has been in the 80's.   Diabetes He presents for his follow-up diabetic visit. He has type 2 diabetes mellitus. Pertinent negatives for diabetes include no blurred vision, no foot paresthesias and no foot ulcerations. Risk factors for coronary artery disease include dyslipidemia, diabetes mellitus, hypertension and male sex. He is following a generally healthy diet. His overall blood glucose range is 90-110 mg/dl.  Hypertension This is a chronic problem. The current episode started more than 1 year ago. The problem has been resolved since onset. The problem is controlled. Pertinent negatives include no blurred vision, malaise/fatigue, peripheral edema or shortness of breath. Risk factors for coronary artery disease include diabetes mellitus and obesity. The current treatment provides moderate improvement.      Review of Systems  Constitutional:  Negative for malaise/fatigue.  Eyes:  Negative for blurred vision.  Respiratory:  Negative for shortness of breath.   All other systems reviewed and are negative.      Objective:   Physical Exam Vitals reviewed.  Constitutional:      General: He is not in acute distress.    Appearance: He is well-developed.  HENT:     Head: Normocephalic.     Right Ear: Tympanic membrane normal.     Left Ear: Tympanic membrane normal.  Eyes:     General:        Right eye: No discharge.        Left eye: No discharge.     Pupils: Pupils are equal, round, and reactive to light.  Neck:     Thyroid: No thyromegaly.   Cardiovascular:     Rate and Rhythm: Normal rate and regular rhythm.     Heart sounds: Normal heart sounds. No murmur heard. Pulmonary:     Effort: Pulmonary effort is normal. No respiratory distress.     Breath sounds: Rhonchi present. No wheezing.  Abdominal:     General: Bowel sounds are normal. There is no distension.     Palpations: Abdomen is soft.     Tenderness: There is no abdominal tenderness.  Musculoskeletal:        General: No tenderness. Normal range of motion.     Cervical back: Normal range of motion and neck supple.  Skin:    General: Skin is warm and dry.     Findings: No erythema or rash.  Neurological:     Mental Status: He is alert and oriented to person, place, and time.     Cranial Nerves: No cranial nerve deficit.     Deep Tendon Reflexes: Reflexes are normal and symmetric.  Psychiatric:        Behavior: Behavior normal.        Thought Content: Thought content normal.        Judgment: Judgment normal.       BP 128/76   Pulse 84   Temp (!) 97.2 F (36.2 C) (Temporal)   Ht 5\' 6"  (1.676 m)   Wt 173  lb 12.8 oz (78.8 kg)   SpO2 95%   BMI 28.05 kg/m      Assessment & Plan:  Ryan Mcintyre comes in today with chief complaint of Diabetes   Diagnosis and orders addressed:  1. Type 2 diabetes mellitus with other specified complication, without long-term current use of insulin (HCC) If low hypoglycemia will need to stop glimepiride 2 mg Continue other medications  Low carb diet  - CMP14+EGFR  2. Hypertension associated with diabetes (HCC) At goal today  - CMP14+EGFR  3. Colon cancer screening - Ambulatory referral to Gastroenterology   Labs pending Health Maintenance reviewed Diet and exercise encouraged  Follow up plan: 2 months    Jannifer Rodney, FNP

## 2023-01-13 LAB — CMP14+EGFR
ALT: 18 [IU]/L (ref 0–44)
AST: 18 [IU]/L (ref 0–40)
Albumin: 4.5 g/dL (ref 3.9–4.9)
Alkaline Phosphatase: 74 [IU]/L (ref 44–121)
BUN/Creatinine Ratio: 17 (ref 10–24)
BUN: 18 mg/dL (ref 8–27)
Bilirubin Total: 0.3 mg/dL (ref 0.0–1.2)
CO2: 27 mmol/L (ref 20–29)
Calcium: 9.9 mg/dL (ref 8.6–10.2)
Chloride: 101 mmol/L (ref 96–106)
Creatinine, Ser: 1.07 mg/dL (ref 0.76–1.27)
Globulin, Total: 2.6 g/dL (ref 1.5–4.5)
Glucose: 90 mg/dL (ref 70–99)
Potassium: 4.9 mmol/L (ref 3.5–5.2)
Sodium: 142 mmol/L (ref 134–144)
Total Protein: 7.1 g/dL (ref 6.0–8.5)
eGFR: 75 mL/min/{1.73_m2} (ref >59)

## 2023-01-15 ENCOUNTER — Encounter: Payer: Self-pay | Admitting: *Deleted

## 2023-01-16 ENCOUNTER — Other Ambulatory Visit: Payer: No Typology Code available for payment source | Admitting: *Deleted

## 2023-01-30 ENCOUNTER — Ambulatory Visit (INDEPENDENT_AMBULATORY_CARE_PROVIDER_SITE_OTHER): Payer: No Typology Code available for payment source

## 2023-01-30 DIAGNOSIS — E1169 Type 2 diabetes mellitus with other specified complication: Secondary | ICD-10-CM | POA: Diagnosis not present

## 2023-01-30 LAB — HM DIABETES EYE EXAM

## 2023-01-30 NOTE — Progress Notes (Signed)
Ryan Mcintyre arrived 01/30/2023 and has given verbal consent to obtain images and complete their overdue diabetic retinal screening.  The images have been sent to an ophthalmologist or optometrist for review and interpretation.  Results will be sent back to Ryan Spencer, FNP for review.  Patient has been informed they will be contacted when we receive the results via telephone or MyChart

## 2023-02-08 DIAGNOSIS — Z6828 Body mass index (BMI) 28.0-28.9, adult: Secondary | ICD-10-CM | POA: Diagnosis not present

## 2023-02-08 DIAGNOSIS — F17210 Nicotine dependence, cigarettes, uncomplicated: Secondary | ICD-10-CM | POA: Diagnosis not present

## 2023-02-08 DIAGNOSIS — Z008 Encounter for other general examination: Secondary | ICD-10-CM | POA: Diagnosis not present

## 2023-02-08 DIAGNOSIS — E663 Overweight: Secondary | ICD-10-CM | POA: Diagnosis not present

## 2023-03-02 ENCOUNTER — Telehealth: Payer: Self-pay | Admitting: Pharmacist

## 2023-03-02 NOTE — Telephone Encounter (Signed)
   This patient is appearing on a report for being at risk of failing the adherence measure for cholesterol (statin), diabetes, and hypertension (ACEi/ARB) medications this calendar year.  Based on fill history, patient does not appear compliant with medications.  We will work to address these issues.  Medication: statin,met, ozempic, lisinopril   Left voicemail for patient to return my call at their convenience. PharmD appt in Jan 2025  Kieth Brightly, PharmD, BCACP, CPP Clinical Pharmacist, [facility]

## 2023-03-05 ENCOUNTER — Other Ambulatory Visit: Payer: Self-pay | Admitting: *Deleted

## 2023-03-05 NOTE — Patient Instructions (Signed)
Visit Information  Thank you for taking time to visit with me today. Please don't hesitate to contact me if I can be of assistance to you before our next scheduled telephone appointment.  Following are the goals we discussed today:   Goals Addressed             This Visit's Progress    RNCM Care Management  EXPECTED OUTCOME: MONITOR, SELF-MANAGE AND REDUCE SYMPTOMS OF COPD       Current Barriers:  Knowledge Deficits related to COPD management Chronic Disease Management support and education needs related to COPD/ action plan No Advanced Directives in place- information previously mailed    COPD Interventions:  (Status:  Condition stable.  Not addressed this visit.) Long Term Goal Provided patient with basic written and verbal COPD education on self care/management/and exacerbation prevention Advised patient to track and manage COPD triggers Provided written and verbal instructions on pursed lip breathing and utilized returned demonstration as teach back Advised patient to self assesses COPD action plan zone and make appointment with provider if in the yellow zone for 48 hours without improvement Advised patient to engage in light exercise as tolerated 3-5 days a week to aid in the the management of COPD Provided education about and advised patient to utilize infection prevention strategies to reduce risk of respiratory infection Discussed the importance of adequate rest and management of fatigue with COPD Screening for signs and symptoms of depression related to chronic disease state  Assessed social determinant of health barriers   Follow Up Plan: Telephone follow up appointment with care management team member scheduled for:   04-09-2023 at 10:30 am     RNCM Care Management: EXPECTED OUTCOME:  MONITOR, SELF-MANAGE AND REDUCE SYMPTOMS OF DIABETES       Current Barriers:  Knowledge Deficits related to Diabetes management Chronic Disease Management support and education needs related  to Diabetes, diet No Advanced Directives in place- information previously mailed A1C above goal currently at 7.2% was 9.9 on 10-17-2022 Lab Results  Component Value Date   HGBA1C 7.2 (H) 12/26/2022     Planned Interventions: Counseled on importance of regular laboratory monitoring as prescribed. Patient has labs on a regular basis. Is doing well with managing his DM at this time. Knows goal of A1C <7.0;        Discussed plans with patient for ongoing care management follow up and provided patient with direct contact information for care management team;      Provided patient with written educational materials related to hypo and hyperglycemia and importance of correct treatment. Denies any acute highs or lows. Was having lows over a month ago and states he stopped taking his glimepiride and his blood sugars have stabilized and has not had any hypoglycemic events. Fasting sugar today 119. He states his range is always in the 120s.     Advised patient, providing education and rationale, to check cbg once daily and record. Reports compliance with Josephine Igo       call provider for findings outside established parameters;       Review of patient status, including review of consultants reports, relevant laboratory and other test results, and medications completed;       Reinforced carbohydrate modified diet and nutritious food choices   Symptom Management: Take medications as prescribed   Attend all scheduled provider appointments Call pharmacy for medication refills 3-7 days in advance of running out of medications Attend church or other social activities Perform all self care activities  independently  Perform IADL's (shopping, preparing meals, housekeeping, managing finances) independently Call provider office for new concerns or questions  check blood sugar at prescribed times: once daily check feet daily for cuts, sores or redness enter blood sugar readings and medication or insulin into daily  log take the blood sugar log to all doctor visits take the blood sugar meter to all doctor visits trim toenails straight across fill half of plate with vegetables limit fast food meals to no more than 1 per week manage portion size read food labels for fat, fiber, carbohydrates and portion size set a realistic goal keep feet up while sitting wash and dry feet carefully every day wear comfortable, cotton socks Follow carbohydrate modified diet/ plate method Continue to exercise daily  Follow Up Plan: Telephone follow up appointment with care management team member scheduled for:  04-09-2023 at 10:30 am         Our next appointment is by telephone on 04-09-2023 at 10:30 am  Please call the care guide team at [phone] if you need to cancel or reschedule your appointment.   If you are experiencing a Mental Health or Behavioral Health Crisis or need someone to talk to, please call the Suicide and Crisis Lifeline: 988 call the [place] National Suicide Prevention Lifeline: 915-381-1766 or TTY: 984-827-2573 TTY 936-793-4496) to talk to a trained counselor call 1-800-273-TALK (toll free, 24 hour hotline)   Patient verbalizes understanding of instructions and care plan provided today and agrees to view in MyChart. Active MyChart status and patient understanding of how to access instructions and care plan via MyChart confirmed with patient.     Telephone follow up appointment with care management team member scheduled for:04-09-2023 at 10:30 am  Danise Edge, BSN RN RN Care Manager  [facility]  Ambulatory Care Management  Direct Number: [phone]

## 2023-03-05 NOTE — Patient Outreach (Signed)
Care Management   Visit Note  03/05/2023 Name: Ryan Mcintyre MRN: [id number] DOB: Aug 01, 1955  Subjective: Ryan Mcintyre is a 67 y.o. year old male who is a primary care patient of Junie Spencer, FNP. The Care Management team was consulted for assistance.      Engaged with patient spoke with patient by telephone.    Goals Addressed             This Visit's Progress    RNCM Care Management  EXPECTED OUTCOME: MONITOR, SELF-MANAGE AND REDUCE SYMPTOMS OF COPD       Current Barriers:  Knowledge Deficits related to COPD management Chronic Disease Management support and education needs related to COPD/ action plan No Advanced Directives in place- information previously mailed    COPD Interventions:  (Status:  Condition stable.  Not addressed this visit.) Long Term Goal Provided patient with basic written and verbal COPD education on self care/management/and exacerbation prevention Advised patient to track and manage COPD triggers Provided written and verbal instructions on pursed lip breathing and utilized returned demonstration as teach back Advised patient to self assesses COPD action plan zone and make appointment with provider if in the yellow zone for 48 hours without improvement Advised patient to engage in light exercise as tolerated 3-5 days a week to aid in the the management of COPD Provided education about and advised patient to utilize infection prevention strategies to reduce risk of respiratory infection Discussed the importance of adequate rest and management of fatigue with COPD Screening for signs and symptoms of depression related to chronic disease state  Assessed social determinant of health barriers   Follow Up Plan: Telephone follow up appointment with care management team member scheduled for:   04-09-2023 at 10:30 am     RNCM Care Management: EXPECTED OUTCOME:  MONITOR, SELF-MANAGE AND REDUCE SYMPTOMS OF DIABETES       Current Barriers:  Knowledge Deficits  related to Diabetes management Chronic Disease Management support and education needs related to Diabetes, diet No Advanced Directives in place- information previously mailed A1C above goal currently at 7.2% was 9.9 on 10-17-2022 Lab Results  Component Value Date   HGBA1C 7.2 (H) 12/26/2022     Planned Interventions: Counseled on importance of regular laboratory monitoring as prescribed. Patient has labs on a regular basis. Is doing well with managing his DM at this time. Knows goal of A1C <7.0;        Discussed plans with patient for ongoing care management follow up and provided patient with direct contact information for care management team;      Provided patient with written educational materials related to hypo and hyperglycemia and importance of correct treatment. Denies any acute highs or lows. Was having lows over a month ago and states he stopped taking his glimepiride and his blood sugars have stabilized and has not had any hypoglycemic events. Fasting sugar today 119. He states his range is always in the 120s.     Advised patient, providing education and rationale, to check cbg once daily and record. Reports compliance with Josephine Igo       call provider for findings outside established parameters;       Review of patient status, including review of consultants reports, relevant laboratory and other test results, and medications completed;       Reinforced carbohydrate modified diet and nutritious food choices   Symptom Management: Take medications as prescribed   Attend all scheduled provider appointments Call pharmacy for medication refills  3-7 days in advance of running out of medications Attend church or other social activities Perform all self care activities independently  Perform IADL's (shopping, preparing meals, housekeeping, managing finances) independently Call provider office for new concerns or questions  check blood sugar at prescribed times: once daily check feet  daily for cuts, sores or redness enter blood sugar readings and medication or insulin into daily log take the blood sugar log to all doctor visits take the blood sugar meter to all doctor visits trim toenails straight across fill half of plate with vegetables limit fast food meals to no more than 1 per week manage portion size read food labels for fat, fiber, carbohydrates and portion size set a realistic goal keep feet up while sitting wash and dry feet carefully every day wear comfortable, cotton socks Follow carbohydrate modified diet/ plate method Continue to exercise daily  Follow Up Plan: Telephone follow up appointment with care management team member scheduled for:  04-09-2023 at 10:30 am            Consent to Services:  Patient was given information about care management services, agreed to services, and gave verbal consent to participate.   Plan: Telephone follow up appointment with care management team member scheduled for:04-09-2023 at 10:30 am  Danise Edge, BSN RN RN Care Manager  [facility]  Ambulatory Care Management  Direct Number: [phone]

## 2023-03-15 ENCOUNTER — Telehealth: Payer: Self-pay | Admitting: Pharmacist

## 2023-03-15 ENCOUNTER — Ambulatory Visit (INDEPENDENT_AMBULATORY_CARE_PROVIDER_SITE_OTHER): Payer: No Typology Code available for payment source | Admitting: Pharmacist

## 2023-03-15 DIAGNOSIS — E119 Type 2 diabetes mellitus without complications: Secondary | ICD-10-CM | POA: Diagnosis not present

## 2023-03-15 DIAGNOSIS — Z7984 Long term (current) use of oral hypoglycemic drugs: Secondary | ICD-10-CM

## 2023-03-15 DIAGNOSIS — G720 Drug-induced myopathy: Secondary | ICD-10-CM | POA: Diagnosis not present

## 2023-03-15 MED ORDER — DAPAGLIFLOZIN PROPANEDIOL 10 MG PO TABS: 10.0000 mg | ORAL_TABLET | Freq: Every day | ORAL | 6 refills | Status: DC

## 2023-03-15 NOTE — Progress Notes (Signed)
 03/15/2023 Name: Ryan Mcintyre MRN: [id number] DOB: [birth year]  Chief Complaint  Patient presents with   Diabetes   Hyperlipidemia   Ryan Mcintyre is a 68 y.o. year old male who was referred for medication management by their primary care provider, Lavell Bari LABOR, FNP. They presented for a face to face visit today.   They were referred to the pharmacist by their PCP for assistance in managing diabetes, hyperlipidemia, and medication access.  He reported  low BG readings (asymptomatic) at last visit, however this has resolved. He has over all remained well.   Subjective:  Care Team: Primary Care Provider: Lavell Bari LABOR, FNP  Medication Access/Adherence  Current Pharmacy:  [facility] [address] [place] - [address] [place] [address] Phone: [phone] Fax: [phone]  [facility] [place], [place] - [address] [place] [address] Phone: [phone] Fax: [phone]   Patient reports affordability concerns with their medications: Yes  Patient reports access/transportation concerns to their pharmacy: No  Patient reports adherence concerns with their medications:  Yes     Diabetes:  Current medications:  Metformin  1000 mg twice Ryan  Glimepiride  2 mg Ryan before breakfast (reduced from 4 mg to 2 mg) Farxiga  10 mg Ryan  Medications tried in the past:  Insulin  glargine 12 units at bedtime     Jardiance  10 mg Ryan (2019-2023) Januvia  100 mg Ryan (2018) Ozempic  (diarrhea, stomach ache)  Current glucose readings: higher in the past 2 weeks per patient--FBG up to 190 Just started new sensor today 14 day TIR 28% 90 day TIR 79% No hypoglycemia 30 day avg BG 140 90 day avg BG 139 Using FSL3 CGM meter  Patient denies hypoglycemic s/sx including dizziness, shakiness, sweating. Patient denies hyperglycemic symptoms including polyuria, polydipsia, polyphagia, nocturia, neuropathy, blurred vision.    Current medication access support: AZ&me (Farxiga )  Hyperlipidemia/ASCVD Risk Reduction  Current lipid lowering medications:  atorvastatin --tolerating okay Medications tried in the past: rosuvastatin  G72 CODED FOR STATIN MYOPATHY   Antiplatelet regimen:  [id number]  ASCVD History: n/a Family History: n/a Risk Factors: T2DM, htn, hld  Current physical activity: encouraged as able    Clinical ASCVD: No  The 10-year ASCVD risk score (Arnett DK, et al., 2019) is: 33.2%   Values used to calculate the score:     Age: 81 years     Sex: Male     Is Non-Hispanic African American: No     Diabetic: Yes     Tobacco smoker: No     Systolic Blood Pressure: 128 mmHg     Is BP treated: No     HDL Cholesterol: 30 mg/dL     Total Cholesterol: 158 mg/dL   Objective:  Lab Results  Component Value Date   HGBA1C 7.2 (H) 12/26/2022    Lab Results  Component Value Date   CREATININE 1.07 01/12/2023   BUN 18 01/12/2023   NA 142 01/12/2023   K 4.9 01/12/2023   CL 101 01/12/2023   CO2 27 01/12/2023    Lab Results  Component Value Date   CHOL 158 10/17/2022   HDL 30 (L) 10/17/2022   LDLCALC 95 10/17/2022   LDLDIRECT 83 12/30/2014   TRIG 192 (H) 10/17/2022   CHOLHDL 5.3 (H) 10/17/2022    Medications Reviewed Today     Reviewed by Billee Mliss BIRCH, [facility] (Pharmacist) on 03/15/23 at 0856  Med List Status: <None>  Medication Order Taking? Sig Documenting Provider Last Dose Status Informant  aspirin  EC 81 MG tablet [id number] No Take 1 tablet (81 mg total) by mouth Ryan. Swallow whole. Lavell Bari LABOR, FNP Taking Active   blood glucose meter kit and supplies [id number] No Dispense based on patient and insurance preference. Use up to four times Ryan as directed. (FOR ICD-10 E10.9, E11.9). Lavell Bari LABOR, FNP Taking Active   dapagliflozin  propanediol (FARXIGA ) 10 MG TABS tablet [id number]  Take 1 tablet (10 mg total) by mouth Ryan before breakfast. Lavell Bari A, FNP  Active    glimepiride  (AMARYL ) 2 MG tablet [id number] No Take 1 tablet (2 mg total) by mouth Ryan with breakfast.  Patient not taking: Reported on 03/05/2023   Lavell Bari LABOR, FNP Not Taking Active   glucose blood test strip [id number] No Test Ryan. Lavell Bari LABOR, FNP Taking Active   lisinopril  (ZESTRIL ) 20 MG tablet [id number] No Take 1 tablet (20 mg total) by mouth Ryan. Lavell Bari A, FNP Taking Active   metFORMIN  (GLUCOPHAGE ) 1000 MG tablet [id number] No TAKE 1 TABLET BY MOUTH TWICE Ryan WITH MEALS Hawks, Christy A, FNP Taking Active   OMEPRAZOLE  PO [id number] No Take by mouth. [provider] Taking Active   rosuvastatin  (CRESTOR ) 5 MG tablet [id number] No Take 1 tablet (5 mg total) by mouth Ryan. Lavell Bari LABOR, FNP Taking Active   sildenafil  (VIAGRA ) 100 MG tablet [id number] No Take 0.5-1 tablets (50-100 mg total) by mouth Ryan as needed for erectile dysfunction. Lavell Bari LABOR, FNP Taking Active               Assessment/Plan:  Diabetes: - Currently uncontrolled, but very close to goal at 7.2% (goal <7). Previous A1C 9.9. uACr 8; eGFR 86. His current FBG up to 140-190 (previously 120-140) - He will continue metformin  and Farxiga  & glimepiride  for now (can't tolerate SGLT2/glp1)--can revisit regimen if patient unable to meet goals Could consider rybelsus  since usually less GI vs ozempic   -Reviewed long term cardiovascular and renal outcomes of uncontrolled blood sugar - Reviewed goal A1c, goal fasting, and goal 2 hour post prandial glucose--continue FSL3 plus CGM - Meets financial criteria for AZ& me patient assistance program through 2025. Will collaborate with provider, CPhT, and patient to pursue continued assistance for 2025  Hyperlipidemia/ASCVD Risk Reduction: - Currently uncontrolled. LDL slightly above goal <70 (currently 83) - Reviewed long term complications of uncontrolled cholesterol - Reviewed dietary recommendations including FOLLOWING A HEART HEALTHY  DIET/HEALTHY PLATE METHOD - Reviewed lifestyle recommendations including increased physical activity as able - Recommend to continue atorvastatin     Follow Up Plan: 1-3 months  Mliss Tarry Griffin, PharmD, BCACP, CPP Clinical Pharmacist, [facility]

## 2023-03-15 NOTE — Telephone Encounter (Signed)
   Patient enrolled in the AZ&me patient assistance program for Farxiga .  Updated RX escribed to [facility] mail order (pharmacy for AZ&me patient assistance).  Patient is stable on current regimen.  He will need to re-enroll for 2025.  Will route encounter to CPhT.  Okay to send me entire PAP if needed.  Erasmus Bistline Dattero Zorah Backes, PharmD, BCACP, CPP Clinical Pharmacist, [facility]

## 2023-03-16 ENCOUNTER — Telehealth: Payer: Self-pay

## 2023-03-16 NOTE — Progress Notes (Signed)
 Pharmacy Medication Assistance Program Note    03/16/2023  Patient ID: Ryan Mcintyre, male   DOB: 02-13-1956, 68 y.o.   MRN: [id number]     03/16/2023  Outreach Medication [employer]  Manufacturer [employer]ication [employer]  [employer] [facility] Farxiga   Dose of Farxiga  10MG   Type of Sport and exercise psychologist  Date Application Sent to Prescriber 03/16/2023    Emailed patient pg to [facility] e-scribed 03/15/23

## 2023-03-19 ENCOUNTER — Other Ambulatory Visit: Payer: Self-pay | Admitting: Family

## 2023-03-19 DIAGNOSIS — E119 Type 2 diabetes mellitus without complications: Secondary | ICD-10-CM

## 2023-03-28 ENCOUNTER — Other Ambulatory Visit: Payer: Self-pay

## 2023-03-28 DIAGNOSIS — Z87891 Personal history of nicotine dependence: Secondary | ICD-10-CM

## 2023-03-28 DIAGNOSIS — Z122 Encounter for screening for malignant neoplasm of respiratory organs: Secondary | ICD-10-CM

## 2023-04-09 ENCOUNTER — Encounter: Payer: Self-pay | Admitting: *Deleted

## 2023-04-09 ENCOUNTER — Other Ambulatory Visit: Payer: Self-pay | Admitting: *Deleted

## 2023-04-09 NOTE — Patient Outreach (Signed)
Care Management   Visit Note  04/09/2023 Name: Ryan Mcintyre MRN: [id number] DOB: 01/11/1956  Subjective: Ryan Mcintyre is a 68 y.o. year old male who is a primary care patient of Junie Spencer, FNP. The Care Management team was consulted for assistance.      Engaged with patient spoke with patient by telephone.    Goals Addressed             This Visit's Progress    RNCM Care Management  EXPECTED OUTCOME: MONITOR, SELF-MANAGE AND REDUCE SYMPTOMS OF COPD       Current Barriers:  Knowledge Deficits related to COPD management Chronic Disease Management support and education needs related to COPD/ action plan No Advanced Directives in place- information previously mailed    COPD Interventions:  (Status:  Condition stable.  Not addressed this visit.) Long Term Goal Provided patient with basic written and verbal COPD education on self care/management/and exacerbation prevention. Denies any changes and states his breathing has been fine since he recovered from covid. Advised patient to track and manage COPD triggers Provided written and verbal instructions on pursed lip breathing and utilized returned demonstration as teach back Advised patient to self assesses COPD action plan zone and make appointment with provider if in the yellow zone for 48 hours without improvement Advised patient to engage in light exercise as tolerated 3-5 days a week to aid in the the management of COPD Provided education about and advised patient to utilize infection prevention strategies to reduce risk of respiratory infection Discussed the importance of adequate rest and management of fatigue with COPD Screening for signs and symptoms of depression related to chronic disease state  Assessed social determinant of health barriers   Follow Up Plan: Telephone follow up appointment with care management team member scheduled for:   05-11-2023 at 11:45 am     RNCM Care Management: EXPECTED OUTCOME:  MONITOR,  SELF-MANAGE AND REDUCE SYMPTOMS OF DIABETES       Current Barriers:  Knowledge Deficits related to Diabetes management Chronic Disease Management support and education needs related to Diabetes, diet No Advanced Directives in place- information previously mailed A1C above goal currently at 7.2% was 9.9 on 10-17-2022 Lab Results  Component Value Date   HGBA1C 7.2 (H) 12/26/2022     Planned Interventions: Counseled on importance of regular laboratory monitoring as prescribed. Denies any acute changes with his DM at this time. Reports that he is doing well. He is very confident that his A1C will be under goal at next check.  Discussed plans with patient for ongoing care management follow up and provided patient with direct contact information for care management team;      Provided patient with written educational materials related to hypo and hyperglycemia and importance of correct treatment. Denies any hypoglycemia or hyperglycemia. Reports his fasting range is between 118-119 most days and post prandial in the 120s.   Advised patient, providing education and rationale, to check cbg once daily and record. Reports compliance with Josephine Igo       call provider for findings outside established parameters;       Review of patient status, including review of consultants reports, relevant laboratory and other test results, and medications completed;       Reinforced carbohydrate modified diet and nutritious food choices. Patient reports that he is following a stricter diet to get his diabetes under control   Symptom Management: Take medications as prescribed   Attend all scheduled provider appointments Call  pharmacy for medication refills 3-7 days in advance of running out of medications Attend church or other social activities Perform all self care activities independently  Perform IADL's (shopping, preparing meals, housekeeping, managing finances) independently Call provider office for new concerns  or questions  check blood sugar at prescribed times: once daily check feet daily for cuts, sores or redness enter blood sugar readings and medication or insulin into daily log take the blood sugar log to all doctor visits take the blood sugar meter to all doctor visits trim toenails straight across fill half of plate with vegetables limit fast food meals to no more than 1 per week manage portion size read food labels for fat, fiber, carbohydrates and portion size set a realistic goal keep feet up while sitting wash and dry feet carefully every day wear comfortable, cotton socks Follow carbohydrate modified diet/ plate method Continue to exercise daily  Follow Up Plan: Telephone follow up appointment with care management team member scheduled for: 05-11-2023 at 11:45 am         Consent to Services:  Patient was given information about care management services, agreed to services, and gave verbal consent to participate.   Plan: Telephone follow up appointment with care management team member scheduled for:05-11-2023 at 11:45 am  Larey Brick, BSN RN [facility], [facility] RN Care Manager Direct Dial: [phone]  Fax: [phone]

## 2023-04-09 NOTE — Patient Instructions (Signed)
Visit Information  Thank you for taking time to visit with me today. Please don't hesitate to contact me if I can be of assistance to you before our next scheduled telephone appointment.  Following are the goals we discussed today:   Goals Addressed             This Visit's Progress    RNCM Care Management  EXPECTED OUTCOME: MONITOR, SELF-MANAGE AND REDUCE SYMPTOMS OF COPD       Current Barriers:  Knowledge Deficits related to COPD management Chronic Disease Management support and education needs related to COPD/ action plan No Advanced Directives in place- information previously mailed    COPD Interventions:  (Status:  Condition stable.  Not addressed this visit.) Long Term Goal Provided patient with basic written and verbal COPD education on self care/management/and exacerbation prevention. Denies any changes and states his breathing has been fine since he recovered from covid. Advised patient to track and manage COPD triggers Provided written and verbal instructions on pursed lip breathing and utilized returned demonstration as teach back Advised patient to self assesses COPD action plan zone and make appointment with provider if in the yellow zone for 48 hours without improvement Advised patient to engage in light exercise as tolerated 3-5 days a week to aid in the the management of COPD Provided education about and advised patient to utilize infection prevention strategies to reduce risk of respiratory infection Discussed the importance of adequate rest and management of fatigue with COPD Screening for signs and symptoms of depression related to chronic disease state  Assessed social determinant of health barriers   Follow Up Plan: Telephone follow up appointment with care management team member scheduled for:   05-11-2023 at 11:45 am     RNCM Care Management: EXPECTED OUTCOME:  MONITOR, SELF-MANAGE AND REDUCE SYMPTOMS OF DIABETES       Current Barriers:  Knowledge Deficits  related to Diabetes management Chronic Disease Management support and education needs related to Diabetes, diet No Advanced Directives in place- information previously mailed A1C above goal currently at 7.2% was 9.9 on 10-17-2022 Lab Results  Component Value Date   HGBA1C 7.2 (H) 12/26/2022     Planned Interventions: Counseled on importance of regular laboratory monitoring as prescribed. Denies any acute changes with his DM at this time. Reports that he is doing well. He is very confident that his A1C will be under goal at next check.  Discussed plans with patient for ongoing care management follow up and provided patient with direct contact information for care management team;      Provided patient with written educational materials related to hypo and hyperglycemia and importance of correct treatment. Denies any hypoglycemia or hyperglycemia. Reports his fasting range is between 118-119 most days and post prandial in the 120s.   Advised patient, providing education and rationale, to check cbg once daily and record. Reports compliance with Josephine Igo       call provider for findings outside established parameters;       Review of patient status, including review of consultants reports, relevant laboratory and other test results, and medications completed;       Reinforced carbohydrate modified diet and nutritious food choices. Patient reports that he is following a stricter diet to get his diabetes under control   Symptom Management: Take medications as prescribed   Attend all scheduled provider appointments Call pharmacy for medication refills 3-7 days in advance of running out of medications Attend church or other social activities Perform  all self care activities independently  Perform IADL's (shopping, preparing meals, housekeeping, managing finances) independently Call provider office for new concerns or questions  check blood sugar at prescribed times: once daily check feet daily for  cuts, sores or redness enter blood sugar readings and medication or insulin into daily log take the blood sugar log to all doctor visits take the blood sugar meter to all doctor visits trim toenails straight across fill half of plate with vegetables limit fast food meals to no more than 1 per week manage portion size read food labels for fat, fiber, carbohydrates and portion size set a realistic goal keep feet up while sitting wash and dry feet carefully every day wear comfortable, cotton socks Follow carbohydrate modified diet/ plate method Continue to exercise daily  Follow Up Plan: Telephone follow up appointment with care management team member scheduled for: 05-11-2023 at 11:45 am         Our next appointment is by telephone on 05-11-2023 at 11:45 am  Please call the care guide team at [phone] if you need to cancel or reschedule your appointment.   If you are experiencing a Mental Health or Behavioral Health Crisis or need someone to talk to, please call the Suicide and Crisis Lifeline: 988 call the [place] National Suicide Prevention Lifeline: 336-402-7700 or TTY: (402) 063-9736 TTY 670-233-0557) to talk to a trained counselor call 1-800-273-TALK (toll free, 24 hour hotline)   Patient verbalizes understanding of instructions and care plan provided today and agrees to view in MyChart. Active MyChart status and patient understanding of how to access instructions and care plan via MyChart confirmed with patient.     Telephone follow up appointment with care management team member scheduled for:05-11-2023 at 11:45 am  Larey Brick, BSN RN [facility], [facility] RN Care Manager Direct Dial: [phone]  Fax: [phone]

## 2023-04-20 ENCOUNTER — Ambulatory Visit: Payer: Self-pay | Admitting: Family

## 2023-04-20 NOTE — Telephone Encounter (Signed)
Chief Complaint:Cough Symptoms: Dry cough when lying down at night Frequency: 2 weeks Pertinent Negatives: Patient denies shortness of breath, any viral symptoms Disposition: [] ED /[] Urgent Care (no appt availability in office) / [x] Appointment(In office/virtual)/ []  [facility]/ [] Home Care/ [] Refused Recommended Disposition /[]  Mobile Bus/ []  Follow-up with PCP Additional Notes: Patient called in stating he has been experiencing a cough for roughly two weeks. Patient states he had covid around this time last year where he had a cough that lingered, but it went away, and now he feels it may be coming back. This course has been roughly two weeks of a dry cough with no phlegm, and it is worse at night when patient lies down. Patient has been using cough drops and night time cough medication.    Copied from CRM [id number]. Topic: Clinical - Red Word Triage >> Apr 20, 2023  4:07 PM Gildardo Pounds wrote: Red Word that prompted transfer to Nurse Triage: Constant cough for two weeks. Gets worse when he lays down. Callback number is [phone] Reason for Disposition  Cough has been present for > 3 weeks  Answer Assessment - Initial Assessment Questions 1. ONSET: "When did the cough begin?"      Two weeks 2. SEVERITY: "How bad is the cough today?"      Just at night when I lay down 3. SPUTUM: "Describe the color of your sputum" (none, dry cough; clear, white, yellow, green)     none 4. HEMOPTYSIS: "Are you coughing up any blood?" If so ask: "How much?" (flecks, streaks, tablespoons, etc.)     none 5. DIFFICULTY BREATHING: "Are you having difficulty breathing?" If Yes, ask: "How bad is it?" (e.g., mild, moderate, severe)    - MILD: No SOB at rest, mild SOB with walking, speaks normally in sentences, can lie down, no retractions, pulse < 100.    - MODERATE: SOB at rest, SOB with minimal exertion and prefers to sit, cannot lie down flat, speaks in phrases, mild retractions, audible  wheezing, pulse 100-120.    - SEVERE: Very SOB at rest, speaks in single words, struggling to breathe, sitting hunched forward, retractions, pulse > 120      No 6. FEVER: "Do you have a fever?" If Yes, ask: "What is your temperature, how was it measured, and when did it start?"     No 7. CARDIAC HISTORY: "Do you have any history of heart disease?" (e.g., heart attack, congestive heart failure)      No 8. LUNG HISTORY: "Do you have any history of lung disease?"  (e.g., pulmonary embolus, asthma, emphysema)     No 9. PE RISK FACTORS: "Do you have a history of blood clots?" (or: recent major surgery, recent prolonged travel, bedridden)     No 10. OTHER SYMPTOMS: "Do you have any other symptoms?" (e.g., runny nose, wheezing, chest pain)       No  Protocols used: Cough - Acute Non-Productive-A-AH

## 2023-04-23 ENCOUNTER — Ambulatory Visit (INDEPENDENT_AMBULATORY_CARE_PROVIDER_SITE_OTHER): Payer: No Typology Code available for payment source | Admitting: Nurse Practitioner

## 2023-04-23 ENCOUNTER — Other Ambulatory Visit: Payer: Self-pay | Admitting: *Deleted

## 2023-04-23 ENCOUNTER — Encounter: Payer: Self-pay | Admitting: Nurse Practitioner

## 2023-04-23 VITALS — BP 119/71 | HR 80 | Temp 97.8°F | Ht 66.0 in | Wt 172.2 lb

## 2023-04-23 DIAGNOSIS — R051 Acute cough: Secondary | ICD-10-CM

## 2023-04-23 DIAGNOSIS — Z599 Problem related to housing and economic circumstances, unspecified: Secondary | ICD-10-CM

## 2023-04-23 MED ORDER — GUAIFENESIN 400 MG PO TABS: 400.0000 mg | ORAL_TABLET | Freq: Four times a day (QID) | ORAL | 0 refills | Status: DC | PRN

## 2023-04-23 NOTE — Patient Instructions (Signed)
 Visit Information  Thank you for taking time to visit with me today. Please don't hesitate to contact me if I can be of assistance to you before our next scheduled telephone appointment.  Following are the goals we discussed today:   Goals Addressed             This Visit's Progress    RNCM Care Management  EXPECTED OUTCOME: MONITOR, SELF-MANAGE AND REDUCE SYMPTOMS OF COPD       Current Barriers:  Knowledge Deficits related to COPD management Chronic Disease Management support and education needs related to COPD/ action plan No Advanced Directives in place- information previously mailed    COPD Interventions:  (Status:  Condition stable.  Not addressed this visit.) Long Term Goal Provided patient with basic written and verbal COPD education on self care/management/and exacerbation prevention. Reports chronic cough and was seen in office today and given guaifenesin for cough. RNCM discussed smoking cessation with him and he has no desire to quit at this time. Advised patient to track and manage COPD triggers Provided written and verbal instructions on pursed lip breathing and utilized returned demonstration as teach back Advised patient to self assesses COPD action plan zone and make appointment with provider if in the yellow zone for 48 hours without improvement Advised patient to engage in light exercise as tolerated 3-5 days a week to aid in the the management of COPD Provided education about and advised patient to utilize infection prevention strategies to reduce risk of respiratory infection Discussed the importance of adequate rest and management of fatigue with COPD Screening for signs and symptoms of depression related to chronic disease state  Assessed social determinant of health barriers   Follow Up Plan: Telephone follow up appointment with care management team member scheduled for:   05-18-2023 at 11:45 am     RNCM Care Management: EXPECTED OUTCOME:  MONITOR, SELF-MANAGE  AND REDUCE SYMPTOMS OF DIABETES       Current Barriers:  Knowledge Deficits related to Diabetes management Chronic Disease Management support and education needs related to Diabetes, diet No Advanced Directives in place- information previously mailed A1C above goal currently at 7.2% was 9.9 on 10-17-2022 Lab Results  Component Value Date   HGBA1C 7.2 (H) 12/26/2022     Planned Interventions: Counseled on importance of regular laboratory monitoring as prescribed. Denies any acute changes with his DM at this time. Reports that he is doing well. Reports compliance with Josephine Igo and reports that his blood sugar is always 120 or less. Discussed plans with patient for ongoing care management follow up and provided patient with direct contact information for care management team;      Provided patient with written educational materials related to hypo and hyperglycemia and importance of correct treatment. Denies any hypoglycemia or hyperglycemia.  Advised patient, providing education and rationale, to check cbg once daily and record. Reports compliance with Josephine Igo       call provider for findings outside established parameters;       Review of patient status, including review of consultants reports, relevant laboratory and other test results, and medications completed;       Reinforced carbohydrate modified diet and nutritious food choices. Patient reports that he is following a stricter diet to get his diabetes under control   Symptom Management: Take medications as prescribed   Attend all scheduled provider appointments Call pharmacy for medication refills 3-7 days in advance of running out of medications Attend church or other social activities Perform all  self care activities independently  Perform IADL's (shopping, preparing meals, housekeeping, managing finances) independently Call provider office for new concerns or questions  check blood sugar at prescribed times: once daily check feet  daily for cuts, sores or redness enter blood sugar readings and medication or insulin into daily log take the blood sugar log to all doctor visits take the blood sugar meter to all doctor visits trim toenails straight across fill half of plate with vegetables limit fast food meals to no more than 1 per week manage portion size read food labels for fat, fiber, carbohydrates and portion size set a realistic goal keep feet up while sitting wash and dry feet carefully every day wear comfortable, cotton socks Follow carbohydrate modified diet/ plate method Continue to exercise daily  Follow Up Plan: Telephone follow up appointment with care management team member scheduled for: 05-18-2023 at 11:45 am         Our next appointment is by telephone on 05-18-2023 at 11:45 am  Please call the care guide team at [phone] if you need to cancel or reschedule your appointment.   If you are experiencing a Mental Health or Behavioral Health Crisis or need someone to talk to, please call the Suicide and Crisis Lifeline: 988 call the [place] National Suicide Prevention Lifeline: 778-094-5946 or TTY: 203-652-8828 TTY 7314558221) to talk to a trained counselor call 1-800-273-TALK (toll free, 24 hour hotline)   Patient verbalizes understanding of instructions and care plan provided today and agrees to view in MyChart. Active MyChart status and patient understanding of how to access instructions and care plan via MyChart confirmed with patient.     Telephone follow up appointment with care management team member scheduled for:05-18-2023 at 11:45 am  Larey Brick, BSN RN [facility], [facility] RN Care Manager Direct Dial: [phone]  Fax: [phone]

## 2023-04-23 NOTE — Patient Outreach (Signed)
 Care Management   Visit Note  04/23/2023 Name: NURI LARMER MRN: [id number] DOB: 1955-11-28  Subjective: Ryan Mcintyre is a 68 y.o. year old male who is a primary care patient of Junie Spencer, FNP. The Care Management team was consulted for assistance.      Engaged with patient spoke with patient by telephone.    Goals Addressed             This Visit's Progress    RNCM Care Management  EXPECTED OUTCOME: MONITOR, SELF-MANAGE AND REDUCE SYMPTOMS OF COPD       Current Barriers:  Knowledge Deficits related to COPD management Chronic Disease Management support and education needs related to COPD/ action plan No Advanced Directives in place- information previously mailed    COPD Interventions:  (Status:  Condition stable.  Not addressed this visit.) Long Term Goal Provided patient with basic written and verbal COPD education on self care/management/and exacerbation prevention. Reports chronic cough and was seen in office today and given guaifenesin for cough. RNCM discussed smoking cessation with him and he has no desire to quit at this time. Advised patient to track and manage COPD triggers Provided written and verbal instructions on pursed lip breathing and utilized returned demonstration as teach back Advised patient to self assesses COPD action plan zone and make appointment with provider if in the yellow zone for 48 hours without improvement Advised patient to engage in light exercise as tolerated 3-5 days a week to aid in the the management of COPD Provided education about and advised patient to utilize infection prevention strategies to reduce risk of respiratory infection Discussed the importance of adequate rest and management of fatigue with COPD Screening for signs and symptoms of depression related to chronic disease state  Assessed social determinant of health barriers   Follow Up Plan: Telephone follow up appointment with care management team member scheduled for:    05-18-2023 at 11:45 am     RNCM Care Management: EXPECTED OUTCOME:  MONITOR, SELF-MANAGE AND REDUCE SYMPTOMS OF DIABETES       Current Barriers:  Knowledge Deficits related to Diabetes management Chronic Disease Management support and education needs related to Diabetes, diet No Advanced Directives in place- information previously mailed A1C above goal currently at 7.2% was 9.9 on 10-17-2022 Lab Results  Component Value Date   HGBA1C 7.2 (H) 12/26/2022     Planned Interventions: Counseled on importance of regular laboratory monitoring as prescribed. Denies any acute changes with his DM at this time. Reports that he is doing well. Reports compliance with Josephine Igo and reports that his blood sugar is always 120 or less. Discussed plans with patient for ongoing care management follow up and provided patient with direct contact information for care management team;      Provided patient with written educational materials related to hypo and hyperglycemia and importance of correct treatment. Denies any hypoglycemia or hyperglycemia.  Advised patient, providing education and rationale, to check cbg once daily and record. Reports compliance with Josephine Igo       call provider for findings outside established parameters;       Review of patient status, including review of consultants reports, relevant laboratory and other test results, and medications completed;       Reinforced carbohydrate modified diet and nutritious food choices. Patient reports that he is following a stricter diet to get his diabetes under control   Symptom Management: Take medications as prescribed   Attend all scheduled provider appointments Call pharmacy  for medication refills 3-7 days in advance of running out of medications Attend church or other social activities Perform all self care activities independently  Perform IADL's (shopping, preparing meals, housekeeping, managing finances) independently Call provider office for  new concerns or questions  check blood sugar at prescribed times: once daily check feet daily for cuts, sores or redness enter blood sugar readings and medication or insulin into daily log take the blood sugar log to all doctor visits take the blood sugar meter to all doctor visits trim toenails straight across fill half of plate with vegetables limit fast food meals to no more than 1 per week manage portion size read food labels for fat, fiber, carbohydrates and portion size set a realistic goal keep feet up while sitting wash and dry feet carefully every day wear comfortable, cotton socks Follow carbohydrate modified diet/ plate method Continue to exercise daily  Follow Up Plan: Telephone follow up appointment with care management team member scheduled for: 05-18-2023 at 11:45 am         Consent to Services:  Patient was given information about care management services, agreed to services, and gave verbal consent to participate.   Plan: Telephone follow up appointment with care management team member scheduled for:05-18-2023 at 11:45 am  Larey Brick, BSN RN [facility], [facility] RN Care Manager Direct Dial: [phone]  Fax: [phone]

## 2023-04-23 NOTE — Progress Notes (Signed)
 Acute Office Visit  Subjective:     Patient ID: Ryan Mcintyre, male    DOB: [birth year], 68 y.o.   MRN: [id number]  Chief Complaint  Patient presents with   Cough    Cough for 3 weeks, gets worse at night    HPI MARTE CELANI present today April 23, 2023 for an acute visit concern for cough for 3 weeks Cough: Patient complains of nonproductive cough.  Symptoms began 3 weeks ago.  The cough is non-productive, without wheezing, dyspnea or hemoptysis, nocturnal and is aggravated by nothing Associated symptoms include: nothing . Patient does not have new pets. Patient does have a history of COPD. Patient does not have a history of environmental allergens. Patient does not have recent travel. Patient does have a history of smoking. Patient  does not have previous Chest X-ray.   Smokes 1/2 pack daily c/o  3 weeks of no productive cough Active Ambulatory Problems    Diagnosis Date Noted   Diabetes mellitus, type 2 (HCC) 09/28/2014   GERD (gastroesophageal reflux disease) 09/28/2014   Hyperlipidemia associated with type 2 diabetes mellitus (HCC) 09/30/2014   Overweight (BMI 25.0-29.9) 04/11/2016   Former smoker 04/11/2016   Hepatic steatosis 02/21/2017   COPD (chronic obstructive pulmonary disease) (HCC) 02/21/2017   History of smoking greater than 50 pack years 12/16/2021   Aortic atherosclerosis (HCC) 05/23/2022   Hypertension associated with diabetes (HCC) 12/26/2022   Resolved Ambulatory Problems    Diagnosis Date Noted   Abscess 09/08/2020   Cough 11/29/2020   Past Medical History:  Diagnosis Date   Diabetes mellitus without complication (HCC)    Hyperlipidemia     Review of Systems  Constitutional:  Negative for chills and fever.  HENT:  Negative for congestion, sinus pain and sore throat.   Respiratory:  Positive for cough. Negative for hemoptysis, sputum production, shortness of breath and wheezing.        Nonproductive for 3 weeks  Cardiovascular:  Negative for  chest pain and leg swelling.  Gastrointestinal:  Negative for abdominal pain, diarrhea, nausea and vomiting.  Musculoskeletal:  Negative for myalgias.  Skin:  Negative for itching and rash.  Neurological:  Negative for dizziness and headaches.   Negative unless indicated in HPI    Objective:    BP 119/71   Pulse 80   Temp 97.8 F (36.6 C) (Temporal)   Ht 5\' 6"  (1.676 m)   Wt 172 lb 3.2 oz (78.1 kg)   SpO2 97%   BMI 27.79 kg/m  BP Readings from Last 3 Encounters:  04/23/23 119/71  01/12/23 128/76  12/26/22 (!) 153/88   Wt Readings from Last 3 Encounters:  04/23/23 172 lb 3.2 oz (78.1 kg)  01/12/23 173 lb 12.8 oz (78.8 kg)  12/26/22 175 lb (79.4 kg)      Physical Exam Vitals and nursing note reviewed.  Constitutional:      Appearance: He is overweight.  HENT:     Head: Normocephalic and atraumatic.     Right Ear: Tympanic membrane, ear canal and external ear normal. There is no impacted cerumen.     Left Ear: Tympanic membrane, ear canal and external ear normal. There is no impacted cerumen.     Nose: Nose normal.     Mouth/Throat:     Mouth: Mucous membranes are moist.  Eyes:     General: No scleral icterus.    Extraocular Movements: Extraocular movements intact.     Conjunctiva/sclera: Conjunctivae normal.  Pupils: Pupils are equal, round, and reactive to light.  Cardiovascular:     Heart sounds: Normal heart sounds.  Pulmonary:     Effort: Pulmonary effort is normal.     Breath sounds: Normal breath sounds. No wheezing.  Musculoskeletal:        General: Normal range of motion.     Right lower leg: No edema.     Left lower leg: No edema.  Skin:    General: Skin is warm and dry.     Findings: No rash.  Neurological:     Mental Status: He is alert and oriented to person, place, and time.  Psychiatric:        Mood and Affect: Mood normal.        Behavior: Behavior normal.        Thought Content: Thought content normal.        Judgment: Judgment normal.      No results found for any visits on 04/23/23.      Assessment & Plan:  Acute cough -     guaiFENesin; Take 1 tablet (400 mg total) by mouth every 6 (six) hours as needed.  Dispense: 30 tablet; Refill: 23  Mervil 68 year old Caucasian male seen today for cough, no acute distress Cough: Guaifenesin 400 mg 1 tablet every 6 hours, increase hydration  Encourage healthy lifestyle choices, including diet (rich in fruits, vegetables, and lean proteins, and low in salt and simple carbohydrates) and exercise (at least 30 minutes of moderate physical activity daily).     The above assessment and management plan was discussed with the patient. The patient verbalized understanding of and has agreed to the management plan. Patient is aware to call the clinic if they develop any new symptoms or if symptoms persist or worsen. Patient is aware when to return to the clinic for a follow-up visit. Patient educated on when it is appropriate to go to the emergency department.  Return if symptoms worsen or fail to improve.  Arrie Aran Santa Lighter, [facility] [address] [place], [place] [address] [phone]  Note: This document was prepared by [employer] voice dictation technology and any errors that results from this process are unintentional.

## 2023-04-24 ENCOUNTER — Telehealth: Payer: Self-pay | Admitting: *Deleted

## 2023-04-24 NOTE — Progress Notes (Signed)
 Complex Care Management Care Guide Note  04/24/2023 Name: LUISMIGUEL LAMERE MRN: [id number] DOB: [birth year]  UMAIR ROSILES is a 68 y.o. year old male who is a primary care patient of Junie Spencer, FNP and is actively engaged with the care management team. I reached out to Hermine Messick by phone today to assist with scheduling  with the BSW.  Follow up plan: Unsuccessful telephone outreach attempt made. A HIPAA compliant phone message was left for the patient providing contact information and requesting a return call.  Gwenevere Ghazi  [facility]  Value-Based Care Institute, [facility] Guide  Direct Dial: [phone]  Fax [phone]

## 2023-04-27 NOTE — Progress Notes (Signed)
 Complex Care Management Care Guide Note  04/27/2023 Name: Ryan Mcintyre MRN: [id number] DOB: 1955-10-27  Ryan Mcintyre is a 68 y.o. year old male who is a primary care patient of Junie Spencer, FNP and is actively engaged with the care management team. I reached out to Hermine Messick by phone today to assist with scheduling  with the BSW.  Follow up plan: Unsuccessful telephone outreach attempt made. A HIPAA compliant phone message was left for the patient providing contact information and requesting a return call. Gwenevere Ghazi  [facility]  Value-Based Care Institute, [place] [facility] Guide  Direct Dial: [phone]  Fax [phone]

## 2023-05-03 NOTE — Progress Notes (Signed)
 Complex Care Management Care Guide Note  05/03/2023 Name: Ryan Mcintyre MRN: [id number] DOB: 20-Sep-1955  Ryan Mcintyre is a 68 y.o. year old male who is a primary care patient of Junie Spencer, FNP and is actively engaged with the care management team. I reached out to Hermine Messick by phone today to assist with scheduling  with the BSW.  Follow up plan: Patient did not agree to speak to social worker for  complex care management services at this time. No further outreach attempts will be made at this time to schedule referral with Child psychotherapist.   Gwenevere Ghazi  [facility]  Value-Based Care Institute, [facility] Guide  Direct Dial: [phone]  Fax [phone]

## 2023-05-11 ENCOUNTER — Other Ambulatory Visit: Payer: No Typology Code available for payment source | Admitting: *Deleted

## 2023-05-18 ENCOUNTER — Encounter: Payer: Self-pay | Admitting: *Deleted

## 2023-05-18 ENCOUNTER — Other Ambulatory Visit: Payer: Self-pay | Admitting: *Deleted

## 2023-05-18 NOTE — Patient Outreach (Signed)
 Care Management   Visit Note  05/18/2023 Name: Ryan Mcintyre MRN: [id number] DOB: 26-Nov-1955  Subjective: Ryan Mcintyre is a 68 y.o. year old male who is a primary care patient of Junie Spencer, FNP. The Care Management team was consulted for assistance.      Engaged with patient spoke with patient by telephone.    Goals Addressed             This Visit's Progress    RNCM Care Management  EXPECTED OUTCOME: MONITOR, SELF-MANAGE AND REDUCE SYMPTOMS OF COPD       Current Barriers:  Knowledge Deficits related to COPD management Chronic Disease Management support and education needs related to COPD/ action plan No Advanced Directives in place- information previously mailed    COPD Interventions:  (Status:  Condition stable.  Not addressed this visit.) Long Term Goal Provided patient with basic written and verbal COPD education on self care/management/and exacerbation prevention. Continues to decline smoking cessation attempts at this time. Advised patient to track and manage COPD triggers Provided written and verbal instructions on pursed lip breathing and utilized returned demonstration as teach back Advised patient to self assesses COPD action plan zone and make appointment with provider if in the yellow zone for 48 hours without improvement Advised patient to engage in light exercise as tolerated 3-5 days a week to aid in the the management of COPD Provided education about and advised patient to utilize infection prevention strategies to reduce risk of respiratory infection Discussed the importance of adequate rest and management of fatigue with COPD Screening for signs and symptoms of depression related to chronic disease state  Assessed social determinant of health barriers   Follow Up Plan: Telephone follow up appointment with care management team member scheduled for:   06-19-2023 at 11:45 am     RNCM Care Management: EXPECTED OUTCOME:  MONITOR, SELF-MANAGE AND REDUCE  SYMPTOMS OF DIABETES       Current Barriers:  Knowledge Deficits related to Diabetes management Chronic Disease Management support and education needs related to Diabetes, diet No Advanced Directives in place- information previously mailed A1C above goal currently at 7.2% was 9.9 on 10-17-2022 Lab Results  Component Value Date   HGBA1C 7.2 (H) 12/26/2022     Planned Interventions: Counseled on importance of regular laboratory monitoring as prescribed. Denies any acute changes with his DM at this time. Reports that he is doing well. Reports continued compliance with Freestyle Libre. Reports fasting glucose this morning of 125, highest 200 lowest 100.  Discussed plans with patient for ongoing care management follow up and provided patient with direct contact information for care management team;      Provided patient with written educational materials related to hypo and hyperglycemia and importance of correct treatment. Denies any hypoglycemia or hyperglycemia.  Advised patient, providing education and rationale, to check cbg once daily and record. Reports compliance with Josephine Igo       call provider for findings outside established parameters;       Review of patient status, including review of consultants reports, relevant laboratory and other test results, and medications completed;       Reinforced carbohydrate modified diet and nutritious food choices. Patient reports that he is following a stricter diet to get his diabetes under control   Symptom Management: Take medications as prescribed   Attend all scheduled provider appointments Call pharmacy for medication refills 3-7 days in advance of running out of medications Attend church or other social activities  Perform all self care activities independently  Perform IADL's (shopping, preparing meals, housekeeping, managing finances) independently Call provider office for new concerns or questions  check blood sugar at prescribed times:  once daily check feet daily for cuts, sores or redness enter blood sugar readings and medication or insulin into daily log take the blood sugar log to all doctor visits take the blood sugar meter to all doctor visits trim toenails straight across fill half of plate with vegetables limit fast food meals to no more than 1 per week manage portion size read food labels for fat, fiber, carbohydrates and portion size set a realistic goal keep feet up while sitting wash and dry feet carefully every day wear comfortable, cotton socks Follow carbohydrate modified diet/ plate method Continue to exercise daily  Follow Up Plan: Telephone follow up appointment with care management team member scheduled for: 06-19-2023 at 11:45 am            Consent to Services:  Patient was given information about care management services, agreed to services, and gave verbal consent to participate.   Plan: Telephone follow up appointment with care management team member scheduled for:06-19-2023 at 11:45 am  Larey Brick, BSN RN [facility], [facility] RN Care Manager Direct Dial: [phone]  Fax: [phone]

## 2023-05-18 NOTE — Patient Instructions (Signed)
 Visit Information  Thank you for taking time to visit with me today. Please don't hesitate to contact me if I can be of assistance to you before our next scheduled telephone appointment.  Following are the goals we discussed today:   Goals Addressed             This Visit's Progress    RNCM Care Management  EXPECTED OUTCOME: MONITOR, SELF-MANAGE AND REDUCE SYMPTOMS OF COPD       Current Barriers:  Knowledge Deficits related to COPD management Chronic Disease Management support and education needs related to COPD/ action plan No Advanced Directives in place- information previously mailed    COPD Interventions:  (Status:  Condition stable.  Not addressed this visit.) Long Term Goal Provided patient with basic written and verbal COPD education on self care/management/and exacerbation prevention. Continues to decline smoking cessation attempts at this time. Advised patient to track and manage COPD triggers Provided written and verbal instructions on pursed lip breathing and utilized returned demonstration as teach back Advised patient to self assesses COPD action plan zone and make appointment with provider if in the yellow zone for 48 hours without improvement Advised patient to engage in light exercise as tolerated 3-5 days a week to aid in the the management of COPD Provided education about and advised patient to utilize infection prevention strategies to reduce risk of respiratory infection Discussed the importance of adequate rest and management of fatigue with COPD Screening for signs and symptoms of depression related to chronic disease state  Assessed social determinant of health barriers   Follow Up Plan: Telephone follow up appointment with care management team member scheduled for:   06-19-2023 at 11:45 am     RNCM Care Management: EXPECTED OUTCOME:  MONITOR, SELF-MANAGE AND REDUCE SYMPTOMS OF DIABETES       Current Barriers:  Knowledge Deficits related to Diabetes  management Chronic Disease Management support and education needs related to Diabetes, diet No Advanced Directives in place- information previously mailed A1C above goal currently at 7.2% was 9.9 on 10-17-2022 Lab Results  Component Value Date   HGBA1C 7.2 (H) 12/26/2022     Planned Interventions: Counseled on importance of regular laboratory monitoring as prescribed. Denies any acute changes with his DM at this time. Reports that he is doing well. Reports continued compliance with Freestyle Libre. Reports fasting glucose this morning of 125, highest 200 lowest 100.  Discussed plans with patient for ongoing care management follow up and provided patient with direct contact information for care management team;      Provided patient with written educational materials related to hypo and hyperglycemia and importance of correct treatment. Denies any hypoglycemia or hyperglycemia.  Advised patient, providing education and rationale, to check cbg once daily and record. Reports compliance with Josephine Igo       call provider for findings outside established parameters;       Review of patient status, including review of consultants reports, relevant laboratory and other test results, and medications completed;       Reinforced carbohydrate modified diet and nutritious food choices. Patient reports that he is following a stricter diet to get his diabetes under control   Symptom Management: Take medications as prescribed   Attend all scheduled provider appointments Call pharmacy for medication refills 3-7 days in advance of running out of medications Attend church or other social activities Perform all self care activities independently  Perform IADL's (shopping, preparing meals, housekeeping, managing finances) independently Call provider office for  new concerns or questions  check blood sugar at prescribed times: once daily check feet daily for cuts, sores or redness enter blood sugar readings and  medication or insulin into daily log take the blood sugar log to all doctor visits take the blood sugar meter to all doctor visits trim toenails straight across fill half of plate with vegetables limit fast food meals to no more than 1 per week manage portion size read food labels for fat, fiber, carbohydrates and portion size set a realistic goal keep feet up while sitting wash and dry feet carefully every day wear comfortable, cotton socks Follow carbohydrate modified diet/ plate method Continue to exercise daily  Follow Up Plan: Telephone follow up appointment with care management team member scheduled for: 06-19-2023 at 11:45 am         Our next appointment is by telephone on 06-19-2023 at 11:45 am  Please call the care guide team at [phone] if you need to cancel or reschedule your appointment.   If you are experiencing a Mental Health or Behavioral Health Crisis or need someone to talk to, please call the Suicide and Crisis Lifeline: 988 call the [place] National Suicide Prevention Lifeline: 2073563078 or TTY: 605-032-6242 TTY 475-827-2908) to talk to a trained counselor call 1-800-273-TALK (toll free, 24 hour hotline)   Patient verbalizes understanding of instructions and care plan provided today and agrees to view in MyChart. Active MyChart status and patient understanding of how to access instructions and care plan via MyChart confirmed with patient.     Telephone follow up appointment with care management team member scheduled for:06-19-2023 at 11:45 am  Larey Brick, BSN RN [facility], [facility] Health RN Care Manager Direct Dial: [phone]  Fax: [phone]

## 2023-05-25 ENCOUNTER — Ambulatory Visit (HOSPITAL_COMMUNITY)
Admission: RE | Admit: 2023-05-25 | Discharge: 2023-05-25 | Disposition: A | Discharge status: Home / Self Care | Payer: No Typology Code available for payment source | Source: Ambulatory Visit | Attending: Oncology | Admitting: Oncology

## 2023-05-25 DIAGNOSIS — Z122 Encounter for screening for malignant neoplasm of respiratory organs: Secondary | ICD-10-CM | POA: Insufficient documentation

## 2023-05-25 DIAGNOSIS — Z87891 Personal history of nicotine dependence: Secondary | ICD-10-CM | POA: Insufficient documentation

## 2023-06-11 DIAGNOSIS — H5203 Hypermetropia, bilateral: Secondary | ICD-10-CM | POA: Diagnosis not present

## 2023-06-11 DIAGNOSIS — H524 Presbyopia: Secondary | ICD-10-CM | POA: Diagnosis not present

## 2023-06-11 DIAGNOSIS — H52223 Regular astigmatism, bilateral: Secondary | ICD-10-CM | POA: Diagnosis not present

## 2023-06-11 DIAGNOSIS — H2513 Age-related nuclear cataract, bilateral: Secondary | ICD-10-CM | POA: Diagnosis not present

## 2023-06-11 LAB — OPHTHALMOLOGY REPORT-SCANNED

## 2023-06-18 NOTE — Telephone Encounter (Signed)
 Patient still needing assistance?

## 2023-06-19 ENCOUNTER — Other Ambulatory Visit: Payer: Self-pay

## 2023-06-19 ENCOUNTER — Other Ambulatory Visit: Payer: Self-pay | Admitting: *Deleted

## 2023-06-19 NOTE — Patient Outreach (Signed)
 Complex Care Management   Visit Note  06/19/2023  Name:  Ryan Mcintyre MRN: [id number] DOB: 07/01/1955  Situation: Referral received for Complex Care Management related to COPD and Diabetes with Complications I obtained verbal consent from Patient.  Visit completed with patient  on the phone  Background:   Past Medical History:  Diagnosis Date   COPD (chronic obstructive pulmonary disease) (HCC) 02/21/2017   Diabetes mellitus without complication (HCC)    GERD (gastroesophageal reflux disease)    Hyperlipidemia     Assessment: Patient Reported Symptoms:  Cognitive Cognitive Status: Alert and oriented to person, place, and time Cognitive/Intellectual Conditions Management [RPT]: None reported or documented in medical history or problem list   Health Maintenance Behaviors: Annual physical exam Healing Pattern: Average Health Facilitated by: Rest  Neurological   Neurological Self-Management Outcome: 3 (uncertain)  HEENT HEENT Symptoms Reported: No symptoms reported HEENT Self-Management Outcome: 4 (good)    Cardiovascular Cardiovascular Symptoms Reported: No symptoms reported Does patient have uncontrolled Hypertension?: No Cardiovascular Self-Management Outcome: 4 (good)  Respiratory Respiratory Symptoms Reported: No symptoms reported Respiratory Conditions: COPD Respiratory Self-Management Outcome: 4 (good)  Endocrine Patient reports the following symptoms related to hypoglycemia or hyperglycemia : No symptoms reported Is patient diabetic?: Yes Is patient checking blood sugars at home?: Yes Endocrine Management Strategies: Medication therapy, Routine screening, Diet modification, Medical device Endocrine Self-Management Outcome: 5 (very good)  Gastrointestinal Gastrointestinal Symptoms Reported: No symptoms reported Gastrointestinal Self-Management Outcome: 4 (good) Nutrition Risk Screen (CP): No indicators present  Genitourinary   Genitourinary Self-Management Outcome:  4 (good)  Integumentary Integumentary Symptoms Reported: No symptoms reported Skin Self-Management Outcome: 4 (good)  Musculoskeletal Musculoskelatal Symptoms Reviewed: No symptoms reported Musculoskeletal Self-Management Outcome: 4 (good) Falls in the past year?: No Number of falls in past year: 1 or less Was there an injury with Fall?: No Fall Risk Category Calculator: 0 Patient Fall Risk Level: Low Fall Risk Patient at Risk for Falls Due to: No Fall Risks Fall risk Follow up: Falls evaluation completed, Education provided  Psychosocial Psychosocial Symptoms Reported: No symptoms reported Behavioral Health Self-Management Outcome: 4 (good) Major Change/Loss/Stressor/Fears (CP): Denies Techniques to Cope with Loss/Stress/Change: None Quality of Family Relationships: non-existent Do you feel physically threatened by others?: No      06/19/2023   11:54 AM  Depression screen PHQ 2/9  Decreased Interest 0  Down, Depressed, Hopeless 0  PHQ - 2 Score 0    There were no vitals filed for this visit.  Medications Reviewed Today     Reviewed by Remona Carmel, RN (Registered Nurse) on 06/19/23 at 1151  Med List Status: <None>   Medication Order Taking? Sig Documenting Provider Last Dose Status Informant  aspirin EC 81 MG tablet [id number] Yes Take 1 tablet (81 mg total) by mouth daily. Swallow whole. Yevette Hem, FNP Taking Active   atorvastatin (LIPITOR) 10 MG tablet [id number] Yes Take 10 mg by mouth daily. [provider] Taking Active   blood glucose meter kit and supplies [id number] Yes Dispense based on patient and insurance preference. Use up to four times daily as directed. (FOR ICD-10 E10.9, E11.9). Yevette Hem, FNP Taking Active   Continuous Glucose Sensor (FREESTYLE LIBRE 3 PLUS SENSOR) MISC [id number] Yes USE AS DIRECTED CHANGE EVERY 15 DAYS TO MONITOR BLOOD GLUCOSE CONTINUOUSLY [provider] Taking Active   dapagliflozin propanediol (FARXIGA) 10  MG TABS tablet [id number] Yes Take 1 tablet (10 mg total) by mouth daily before breakfast. Yevette Hem, FNP  Taking Active   glimepiride (AMARYL) 2 MG tablet [id number] Yes Take 1 tablet (2 mg total) by mouth daily with breakfast. Yevette Hem, FNP Taking Active   glucose blood test strip [id number] Yes Test daily. Yevette Hem, FNP Taking Active   guaifenesin (HUMIBID E) 400 MG TABS tablet [id number] Yes Take 1 tablet (400 mg total) by mouth every 6 (six) hours as needed. [facility] [place], Adell Hones, NP Taking Active   lisinopril (ZESTRIL) 20 MG tablet [id number] Yes Take 1 tablet (20 mg total) by mouth daily. Yevette Hem, FNP Taking Active   metFORMIN (GLUCOPHAGE) 1000 MG tablet [id number] Yes Take 1 tablet (1,000 mg total) by mouth 2 (two) times daily with a meal. **NEEDS TO BE SEEN BEFORE NEXT REFILL** Yevette Hem, FNP Taking Active   OMEPRAZOLE PO [id number] Yes Take by mouth. [provider] Taking Active   sildenafil (VIAGRA) 100 MG tablet [id number] Yes Take 0.5-1 tablets (50-100 mg total) by mouth daily as needed for erectile dysfunction. Yevette Hem, FNP Taking Active             Recommendation:   PCP Follow-up  Follow Up Plan:   Telephone follow up appointment date/time:  07-19-2023 at 10:30 am  Grandville Lax, BSN RN [facility], [facility] RN Care Manager Direct Dial: [phone]  Fax: [phone]

## 2023-06-19 NOTE — Patient Instructions (Signed)
 Visit Information  Thank you for taking time to visit with me today. Please don't hesitate to contact me if I can be of assistance to you before our next scheduled appointment.  Your next care management appointment is by telephone on 07-19-2023 at 10:30 am  Telephone follow-up in 1 month  Please call the care guide team at [phone] if you need to cancel, schedule, or reschedule an appointment.   Please call the Suicide and Crisis Lifeline: 988 call the [place]  National Suicide Prevention Lifeline: 4026090469 or TTY: 614-719-6780 TTY 681-823-9211) to talk to a trained counselor call 1-800-273-TALK (toll free, 24 hour hotline) call the [facility] [phone] call 911 if you are experiencing a Mental Health or Behavioral Health Crisis or need someone to talk to.  Grandville Lax, BSN RN [facility], [facility] Health RN Care Manager Direct Dial: [phone]  Fax: [phone]

## 2023-06-23 ENCOUNTER — Other Ambulatory Visit: Payer: Self-pay | Admitting: Family

## 2023-06-23 DIAGNOSIS — E119 Type 2 diabetes mellitus without complications: Secondary | ICD-10-CM

## 2023-06-23 DIAGNOSIS — I700 Atherosclerosis of aorta: Secondary | ICD-10-CM

## 2023-06-23 DIAGNOSIS — E1169 Type 2 diabetes mellitus with other specified complication: Secondary | ICD-10-CM

## 2023-06-25 NOTE — Progress Notes (Signed)
 Patient notified of LDCT Lung Cancer Screening Results via mail with the recommendation to follow-up in 12 months. Patient's referring provider has been sent a copy of results. Results are as follows:  IMPRESSION: 1. Lung-RADS 2, benign appearance or behavior. Continue annual screening with low-dose chest CT without contrast in 12 months. 2. Cholelithiasis. 3.  Aortic atherosclerosis (ICD10-I70.0). 4.  Emphysema (ICD10-J43.9).

## 2023-06-26 ENCOUNTER — Other Ambulatory Visit: Payer: Self-pay | Admitting: Family

## 2023-06-26 DIAGNOSIS — I700 Atherosclerosis of aorta: Secondary | ICD-10-CM

## 2023-06-26 DIAGNOSIS — E1169 Type 2 diabetes mellitus with other specified complication: Secondary | ICD-10-CM

## 2023-07-17 ENCOUNTER — Encounter (INDEPENDENT_AMBULATORY_CARE_PROVIDER_SITE_OTHER): Payer: Self-pay | Admitting: *Deleted

## 2023-07-19 ENCOUNTER — Telehealth: Payer: Self-pay

## 2023-07-19 ENCOUNTER — Other Ambulatory Visit: Payer: Self-pay

## 2023-07-19 ENCOUNTER — Other Ambulatory Visit: Payer: Self-pay | Admitting: *Deleted

## 2023-07-19 VITALS — BP 127/68

## 2023-07-19 DIAGNOSIS — E1169 Type 2 diabetes mellitus with other specified complication: Secondary | ICD-10-CM

## 2023-07-19 NOTE — Addendum Note (Signed)
 Addended by: Remona Carmel on: 07/19/2023 12:33 PM   Modules accepted: Orders

## 2023-07-19 NOTE — Patient Instructions (Signed)
 Visit Information  Thank you for taking time to visit with me today. Please don't hesitate to contact me if I can be of assistance to you before our next scheduled appointment.  Your next care management appointment is by telephone on 08-16-2023 at 10:30 am  Telephone follow-up in 1 month  Please call the care guide team at [phone] if you need to cancel, schedule, or reschedule an appointment.   Please call the Suicide and Crisis Lifeline: 988 call the [place]  National Suicide Prevention Lifeline: 215-238-7850 or TTY: 716-310-8530 TTY 8150021154) to talk to a trained counselor call 1-800-273-TALK (toll free, 24 hour hotline) call the [facility]: 270-708-2097 call 911 if you are experiencing a Mental Health or Behavioral Health Crisis or need someone to talk to.  Grandville Lax, BSN RN [facility], [facility] Health RN Care Manager Direct Dial : [phone]  Fax: [phone]   Advance Directive  Advance directives are legal papers that state your wishes about health care decisions. They let your wishes be known to family, friends, and health care providers if you become unable to speak for yourself.  You should write these papers out over time rather than all at once. They can be changed and updated at any time. The types of advance directives include: Medical power of attorney (POA). Living wills. Do not resuscitate (DNR) or do not attempt resuscitation (DNAR) orders. What are a health care proxy and medical POA? A health care proxy is also called a health care agent. It's a person you choose to make medical decisions for you when you can't make them for yourself. In most cases, a proxy is a trusted friend or family member. A medical POA is legal paperwork that names your proxy. It may need to be: Signed. Notarized. Dated. Copied. Witnessed. Added to your medical record. You may also want to choose someone to handle your  money if you can't do so. This is called a durable POA for finances. It's separate from a medical POA. You may choose your health care proxy or someone else to act as your agent in money matters. If you don't have a proxy, or if the proxy may not be acting in your best interest, a court may choose a guardian to act on your behalf. What is a living will? A living will is legal paperwork that states your wishes about medical care. Providers should keep a copy of it in your medical record. You may want to give a copy to family members or friends. You can also keep a card in your wallet to let loved ones know you have a living will and where they can find it. A living will may be used if: You're very sick with something that will end your life. You become disabled. You can't make decisions or speak for yourself. Your living will should include whether: To use or not use life support equipment. This may include machines to filter your blood or to help you breathe. You want a DNR or DNAR order. This tells providers not to use CPR if your heart or breathing stops. To use or not use tube feeding. You want to be given foods and fluids. You want a type of comfort care called palliative care. This may be given when the goal for treatment becomes comfort rather than a cure. You want to donate your organs and tissues. A living will doesn't say what to do with your money and property if you pass away.  What is a DNR or DNAR? A DNR or DNAR order is a request not to have CPR. If you don't have one of these orders, a provider will try to help you if your heart stops or you stop breathing.  If you plan to have surgery, talk with your provider about your DNR or DNAR order. What happens if I don't have an advance directive? Each state has its own laws about advance directives. Some states assign family decision makers to act on your behalf if you don't have an advance directive.  Check with your provider, attorney,  or state representative about the laws in your state. Where to find more information Each state has its own laws about advance directives. You can look up these laws at: https://rodriguez-phillips.com/ This information is not intended to replace advice given to you by your health care provider. Make sure you discuss any questions you have with your health care provider. Document Revised: 07/10/2022 Document Reviewed: 07/10/2022 Elsevier Patient Education  2024 [employer].

## 2023-07-19 NOTE — Telephone Encounter (Signed)
-----   Message from Nurse Lanney Pitts sent at 07/19/2023 11:36 AM EDT ----- Good Morning! I spoke with patient this morning and he reports that when he coughs he becomes dizzy and weak if standing. He reports that last night he was standing in the kitchen and had to cough and got weak and fell in the floor. He denies fainting, hitting his head or any other injuries. He is concerned as to why this is happening. Can someone follow up with him? Please let me know if you have any questions. Thanks, Odilia Bennett

## 2023-07-19 NOTE — Telephone Encounter (Signed)
 Attempted to contact patient - NA/ mailbox is not accepting calls at this time.

## 2023-07-19 NOTE — Patient Outreach (Signed)
 Complex Care Management   Visit Note  07/19/2023  Name:  Ryan Mcintyre MRN: [id number] DOB: 04/09/1955  Situation: Referral received for Complex Care Management related to COPD and Diabetes with Complications I obtained verbal consent from Patient.  Visit completed with patient  on the phone  Background:   Past Medical History:  Diagnosis Date   COPD (chronic obstructive pulmonary disease) (HCC) 02/21/2017   Diabetes mellitus without complication (HCC)    GERD (gastroesophageal reflux disease)    Hyperlipidemia     Assessment: Patient Reported Symptoms:  Cognitive Cognitive Status: Alert and oriented to person, place, and time Cognitive/Intellectual Conditions Management [RPT]: None reported or documented in medical history or problem list   Health Maintenance Behaviors: Annual physical exam Health Facilitated by: Rest  Neurological Neurological Review of Symptoms: Weakness Neurological Comment: Reports when he coughs he gets weak and if standing he will fall. recent fall 07-18-23  HEENT HEENT Symptoms Reported: No symptoms reported HEENT Management Strategies: Routine screening    Cardiovascular Cardiovascular Symptoms Reported: No symptoms reported Does patient have uncontrolled Hypertension?: No Cardiovascular Conditions: Hypertension Cardiovascular Management Strategies: Medication therapy, Routine screening  Respiratory Respiratory Symptoms Reported: Dry cough Respiratory Conditions: Cough  Endocrine Patient reports the following symptoms related to hypoglycemia or hyperglycemia : No symptoms reported Is patient diabetic?: Yes Is patient checking blood sugars at home?: Yes Endocrine Conditions: Diabetes Endocrine Management Strategies: Medication therapy, Routine screening Endocrine Self-Management Outcome: 4 (good)  Gastrointestinal Gastrointestinal Symptoms Reported: No symptoms reported   Nutrition Risk Screen (CP): No indicators present  Genitourinary  Genitourinary Symptoms Reported: No symptoms reported    Integumentary Integumentary Symptoms Reported: No symptoms reported    Musculoskeletal Musculoskelatal Symptoms Reviewed: No symptoms reported Musculoskeletal Management Strategies: Routine screening Falls in the past year?: Yes Number of falls in past year: 1 or less Was there an injury with Fall?: Yes Fall Risk Category Calculator: 2 Patient Fall Risk Level: Moderate Fall Risk Patient at Risk for Falls Due to: History of fall(s), Other (Comment) (patient reports when coughing he becomes weak and had a fall on 07-18-23)  Psychosocial Psychosocial Symptoms Reported: No symptoms reported     Quality of Family Relationships: helpful, involved, supportive Do you feel physically threatened by others?: No      07/19/2023   10:49 AM  Depression screen PHQ 2/9  Decreased Interest 0  Down, Depressed, Hopeless 0  PHQ - 2 Score 0    Vitals:   07/18/23 1800  BP: 127/68    Medications Reviewed Today     Reviewed by Remona Carmel, RN (Registered Nurse) on 07/19/23 at 1042  Med List Status: <None>   Medication Order Taking? Sig Documenting Provider Last Dose Status Informant  atorvastatin  (LIPITOR) 10 MG tablet [id number] No Take 10 mg by mouth daily.  Patient not taking: Reported on 07/19/2023   [provider] Not Taking Consider Medication Status and Discontinue   blood glucose meter kit and supplies [id number] Yes Dispense based on patient and insurance preference. Use up to four times daily as directed. (FOR ICD-10 E10.9, E11.9). Yevette Hem, FNP Taking Active   Continuous Glucose Sensor (FREESTYLE LIBRE 3 PLUS SENSOR) MISC [id number] Yes USE AS DIRECTED CHANGE EVERY 15 DAYS TO MONITOR BLOOD GLUCOSE CONTINUOUSLY [provider] Taking Active   dapagliflozin  propanediol (FARXIGA ) 10 MG TABS tablet [id number] Yes Take 1 tablet (10 mg total) by mouth daily before breakfast. Yevette Hem, FNP Taking Active    EQ ASPIRIN  ADULT LOW DOSE  81 MG tablet [id number] Yes TAKE 1 TABLET BY MOUTH ONCE DAILY. SWALLOW WHOLE Tommas Fragmin A, FNP Taking Active   glimepiride  (AMARYL ) 2 MG tablet [id number] Yes Take 1 tablet (2 mg total) by mouth daily with breakfast. **NEEDS TO BE SEEN BEFORE NEXT REFILL** Tommas Fragmin A, FNP Taking Active   glucose blood test strip [id number] Yes Test daily. Yevette Hem, FNP Taking Active   guaifenesin  (HUMIBID E) 400 MG TABS tablet [id number] No Take 1 tablet (400 mg total) by mouth every 6 (six) hours as needed.  Patient not taking: Reported on 07/19/2023   Anton Baton, NP Not Taking Consider Medication Status and Discontinue   lisinopril  (ZESTRIL ) 20 MG tablet [id number] Yes Take 1 tablet (20 mg total) by mouth daily. Yevette Hem, FNP Taking Active   metFORMIN  (GLUCOPHAGE ) 1000 MG tablet [id number] Yes Take 1 tablet (1,000 mg total) by mouth 2 (two) times daily with a meal. **NEEDS TO BE SEEN BEFORE NEXT REFILL** Yevette Hem, FNP Taking Active   OMEPRAZOLE PO [id number] Yes Take by mouth. [provider] Taking Active   sildenafil  (VIAGRA ) 100 MG tablet [id number] Yes Take 0.5-1 tablets (50-100 mg total) by mouth daily as needed for erectile dysfunction. Yevette Hem, FNP Taking Active             Recommendation:   PCP Follow-up Specialty provider follow-up Podiatrist for toenail clipping per patient request  Follow Up Plan:   Telephone follow up appointment date/time:  08-16-2023 at 10:30 am  Grandville Lax, BSN RN [facility], [facility] Health RN Care Manager Direct Dial : [phone]  Fax: [phone]

## 2023-07-20 NOTE — Telephone Encounter (Signed)
 Scheduled with christy for next Tuesday. LS

## 2023-07-23 ENCOUNTER — Telehealth: Payer: Self-pay

## 2023-07-23 DIAGNOSIS — E119 Type 2 diabetes mellitus without complications: Secondary | ICD-10-CM

## 2023-07-23 NOTE — Progress Notes (Signed)
 Care Guide Pharmacy Note  07/23/2023 Name: Ryan Mcintyre MRN: [id number] DOB: 03/06/1956  Referred By: Yevette Hem, FNP Reason for referral: Complex Care Management (Outreach to schedule with Pharm d )   Ryan Mcintyre is a 68 y.o. year old male who is a primary care patient of Yevette Hem, FNP.  Ryan Mcintyre was referred to the pharmacist for assistance related to: DMII  An unsuccessful telephone outreach was attempted today to contact the patient who was referred to the pharmacy team for assistance with medication management. Additional attempts will be made to contact the patient.  Lenton Rail , RMA     [facility]  [facility], [facility] Guide  Direct Dial : [phone]  Website: [email or web address]

## 2023-07-23 NOTE — Telephone Encounter (Signed)
 Copied from CRM [id number]. Topic: Clinical - Medication Question >> Jul 23, 2023  3:14 PM Brynn Caras wrote: Reason for CRM: Angie with [facility] is requesting a refill of metFORMIN  (GLUCOPHAGE ) 1000 MG tablet, she is requesting for a 100 day supply to be sent over to his pharmacy - [id number] in [place], [place]. PT must be seen before his next refill, next OV is tomorrow at 12:10p.

## 2023-07-24 ENCOUNTER — Ambulatory Visit (INDEPENDENT_AMBULATORY_CARE_PROVIDER_SITE_OTHER): Admitting: Family

## 2023-07-24 ENCOUNTER — Encounter: Payer: Self-pay | Admitting: Family

## 2023-07-24 VITALS — BP 120/71 | HR 63 | Temp 88.0°F | Ht 66.0 in | Wt 174.0 lb

## 2023-07-24 DIAGNOSIS — H109 Unspecified conjunctivitis: Secondary | ICD-10-CM

## 2023-07-24 DIAGNOSIS — T17308A Unspecified foreign body in larynx causing other injury, initial encounter: Secondary | ICD-10-CM

## 2023-07-24 DIAGNOSIS — R1313 Dysphagia, pharyngeal phase: Secondary | ICD-10-CM | POA: Diagnosis not present

## 2023-07-24 DIAGNOSIS — Z87891 Personal history of nicotine dependence: Secondary | ICD-10-CM | POA: Diagnosis not present

## 2023-07-24 DIAGNOSIS — K219 Gastro-esophageal reflux disease without esophagitis: Secondary | ICD-10-CM | POA: Diagnosis not present

## 2023-07-24 MED ORDER — POLYMYXIN B-TRIMETHOPRIM 10000-0.1 UNIT/ML-% OP SOLN: 2.0000 [drp] | OPHTHALMIC | 0 refills | Status: DC

## 2023-07-24 MED ORDER — METFORMIN HCL 1000 MG PO TABS: 1000.0000 mg | ORAL_TABLET | Freq: Two times a day (BID) | ORAL | 2 refills | Status: DC

## 2023-07-24 MED ORDER — OMEPRAZOLE 40 MG PO CPDR: 40.0000 mg | DELAYED_RELEASE_CAPSULE | Freq: Every day | ORAL | 3 refills | Status: AC

## 2023-07-24 NOTE — Patient Instructions (Signed)
 Dysphagia  Dysphagia is trouble swallowing. This condition occurs when solids and liquids stick in a person's throat on the way down to the stomach, or when food takes longer to get to the stomach than usual. You may have problems swallowing food, liquids, or both. You may also have pain while trying to swallow. It may take you more time and effort to swallow something. What are the causes? This condition may be caused by: Muscle problems. These may make it difficult for you to move food and liquids through the esophagus, which is the tube that connects your mouth to your stomach. Blockages. You may have ulcers, scar tissue, or inflammation that blocks the normal passage of food and liquids. Causes of these problems include: Acid reflux from your stomach into your esophagus (gastroesophageal reflux). Infections. Radiation treatment for cancer. Medicines taken without enough fluids to wash them down into your stomach. Stroke. This can affect the nerves and make it difficult to swallow. Nerve problems. These prevent signals from being sent to the muscles of your esophagus to squeeze (contract) and move what you swallow down to your stomach. Globus pharyngeus. This is a common problem that involves a feeling like something is stuck in your throat or a sense of trouble with swallowing, even though nothing is wrong with the swallowing passages. Certain conditions, such as cerebral palsy or Parkinson's disease. What are the signs or symptoms? Common symptoms of this condition include: A feeling that solids or liquids are stuck in your throat on the way down to the stomach. Pain while swallowing. Coughing or gagging while trying to swallow. Other symptoms include: Food moving back from your stomach to your mouth (regurgitation). Noises coming from your throat. Chest discomfort when swallowing. A feeling of fullness when swallowing. Drooling, especially when the throat is blocked. Heartburn. How  is this diagnosed? This condition may be diagnosed by: Barium swallow X-ray. In this test, you will swallow a white liquid that sticks to the inside of your esophagus. X-ray images are then taken. Endoscopy. In this test, a flexible telescope is inserted down your throat to look at your esophagus and your stomach. CT scans or an MRI. How is this treated? Treatment for dysphagia depends on the cause of this condition: If the dysphagia is caused by acid reflux or infection, medicines may be used. These may include antibiotics or heartburn medicines. If the dysphagia is caused by problems with the muscles, swallowing therapy may be used to help you strengthen your swallowing muscles. You may have to do specific exercises to strengthen the muscles or stretch them. If the dysphagia is caused by a blockage or mass, procedures to remove the blockage may be done. You may need surgery and a feeding tube. You may need to make diet changes. Ask your health care provider for specific instructions. Follow these instructions at home: Medicines Take over-the-counter and prescription medicines only as told by your health care provider. If you were prescribed an antibiotic medicine, take it as told by your health care provider. Do not stop taking the antibiotic even if you start to feel better. Eating and drinking  Make any diet changes as told by your health care provider. Work with a diet and nutrition specialist (dietitian) to create an eating plan that will help you get the nutrients you need in order to stay healthy. Eat soft foods that are easier to swallow. Cut your food into small pieces and eat slowly. Take small bites. Eat and drink only when you  are sitting upright. Do not drink alcohol or caffeine. If you need help quitting, ask your health care provider. General instructions Check your weight every day to make sure you are not losing weight. Do not use any products that contain nicotine or  tobacco. These products include cigarettes, chewing tobacco, and vaping devices, such as e-cigarettes. If you need help quitting, ask your health care provider. Keep all follow-up visits. This is important. Contact a health care provider if: You lose weight because you cannot swallow. You cough when you drink liquids. You cough up partially digested food. Get help right away if: You cannot swallow your saliva. You have shortness of breath, a fever, or both. Your voice is hoarse and you have trouble swallowing. These symptoms may represent a serious problem that is an emergency. Do not wait to see if the symptoms will go away. Get medical help right away. Call your local emergency services (911 in the [place] Do not drive yourself to the hospital. Summary Dysphagia is trouble swallowing. This condition occurs when solids and liquids stick in a person's throat on the way down to the stomach. You may cough or gag while trying to swallow. Dysphagia has many possible causes. Treatment for dysphagia depends on the cause of the condition. Keep all follow-up visits. This is important. This information is not intended to replace advice given to you by your health care provider. Make sure you discuss any questions you have with your health care provider. Document Revised: 10/10/2019 Document Reviewed: 10/11/2019 Elsevier Patient Education  2024 [employer].

## 2023-07-24 NOTE — Progress Notes (Signed)
 Subjective:    Patient ID: Ryan Mcintyre, male    DOB: 1955/05/05, 68 y.o.   MRN: [id number]  Chief Complaint  Patient presents with   Choking    COMES OUT OF THE BLUE AND IT MAKES HIM HIT THE FLOOR    Eye Problem    RIGHT EYE RED   PT presents to the office with choking and then dizziness that comes and goes. Reports he can get chocked without drinking or eating.   He quit smoking about a year ago.  Eye Problem  The right eye is affected. This is a new problem. The current episode started 1 to 4 weeks ago. The problem occurs constantly. The problem has been gradually worsening. The pain is mild. Associated symptoms include an eye discharge, eye redness and photophobia. Pertinent negatives include no double vision, fever, itching or nausea. He has tried nothing for the symptoms. The treatment provided no relief.  Gastroesophageal Reflux He complains of belching and choking. He reports no dysphagia, no heartburn or no nausea.      Review of Systems  Constitutional:  Negative for fever.  Eyes:  Positive for photophobia, discharge and redness. Negative for double vision and itching.  Respiratory:  Positive for choking.   Gastrointestinal:  Negative for dysphagia, heartburn and nausea.  All other systems reviewed and are negative.   Social History   Socioeconomic History   Marital status: Married    Spouse name: Mary   Number of children: 3   Years of education: 12   Highest education level: 12th grade  Occupational History   Occupation: Designer, fashion/clothing    Comment: Disabled  Tobacco Use   Smoking status: Former    Current packs/day: 0.00    Average packs/day: 0.3 packs/day for 50.0 years (12.5 ttl pk-yrs)    Types: Cigarettes    Start date: 04/06/1970    Quit date: 04/06/2020    Years since quitting: 3.2   Smokeless tobacco: Never   Tobacco comments:    about 3 to 4 cigarettes a day - finally quit completely  Vaping Use   Vaping status: Never Used  Substance and Sexual  Activity   Alcohol use: No    Alcohol/week: 0.0 standard drinks of alcohol   Drug use: No   Sexual activity: Yes  Other Topics Concern   Not on file  Social History Narrative   Lives on one level home with wife   They enjoy going dancing every Thursday night   Social Drivers of Health   Financial Resource Strain: Low Risk  (10/02/2022)   Overall Financial Resource Strain (CARDIA)    Difficulty of Paying Living Expenses: Not hard at all  Food Insecurity: No Food Insecurity (07/19/2023)   Hunger Vital Sign    Worried About Running Out of Food in the Last Year: Never true    Ran Out of Food in the Last Year: Never true  Transportation Needs: No Transportation Needs (07/19/2023)   PRAPARE - Administrator, Civil Service (Medical): No    Lack of Transportation (Non-Medical): No  Physical Activity: Sufficiently Active (04/09/2023)   Exercise Vital Sign    Days of Exercise per Week: 7 days    Minutes of Exercise per Session: 30 min  Stress: No Stress Concern Present (10/02/2022)   [employer] of Occupational Health - Occupational Stress Questionnaire    Feeling of Stress : Not at all  Social Connections: Moderately Integrated (10/02/2022)   Social Connection and Isolation Panel [  NHANES]    Frequency of Communication with Friends and Family: More than three times a week    Frequency of Social Gatherings with Friends and Family: More than three times a week    Attends Religious Services: More than 4 times per year    Active Member of [employer] or Organizations: No    Attends Banker Meetings: Never    Marital Status: Married   Family History  Problem Relation Age of Onset   Hypertension Mother    Diabetes Mother    Diabetes Sister    Early death Sister    Early death Brother    Early death Brother         Objective:   Physical Exam Vitals reviewed.  Constitutional:      General: He is not in acute distress.    Appearance: He is well-developed.   HENT:     Head: Normocephalic.     Right Ear: Tympanic membrane normal.     Left Ear: Tympanic membrane normal.  Eyes:     General:        Right eye: No discharge.        Left eye: No discharge.     Pupils: Pupils are equal, round, and reactive to light.  Neck:     Thyroid : No thyromegaly.  Cardiovascular:     Rate and Rhythm: Normal rate and regular rhythm.     Heart sounds: Normal heart sounds. No murmur heard. Pulmonary:     Effort: Pulmonary effort is normal. No respiratory distress.     Breath sounds: Normal breath sounds. No wheezing.  Abdominal:     General: Bowel sounds are normal. There is no distension.     Palpations: Abdomen is soft.     Tenderness: There is no abdominal tenderness.  Musculoskeletal:        General: No tenderness. Normal range of motion.     Cervical back: Normal range of motion and neck supple.  Skin:    General: Skin is warm and dry.     Findings: No erythema or rash.  Neurological:     Mental Status: He is alert and oriented to person, place, and time.     Cranial Nerves: No cranial nerve deficit.     Deep Tendon Reflexes: Reflexes are normal and symmetric.  Psychiatric:        Behavior: Behavior normal.        Thought Content: Thought content normal.        Judgment: Judgment normal.      BP 120/71   Pulse 63   Temp (!) 88 F (31.1 C) (Temporal)   Ht 5\' 6"  (1.676 m)   Wt 174 lb (78.9 kg)   SpO2 92%   BMI 28.08 kg/m      Assessment & Plan:  Ryan Mcintyre comes in today with chief complaint of Choking (COMES OUT OF THE BLUE AND IT MAKES HIM HIT THE FLOOR ) and Eye Problem (RIGHT EYE RED)   Diagnosis and orders addressed:  1. Choking, initial encounter (Primary) - Ambulatory referral to Gastroenterology  2. Dysphagia, pharyngeal phase - Ambulatory referral to Gastroenterology  3. Bacterial conjunctivitis Start poyltrim  Good hand hygiene  Cool compresses  - trimethoprim -polymyxin b  (POLYTRIM ) ophthalmic solution; Place  2 drops into the right eye every 4 (four) hours.  Dispense: 10 mL; Refill: 0  4. Gastroesophageal reflux disease, unspecified whether esophagitis present Will increase Prilosec to 40 mg from 20 mg  Referral  to GI pending  -Diet discussed- Avoid fried, spicy, citrus foods, caffeine and alcohol -Do not eat 2-3 hours before bedtime -Encouraged small frequent meals -Avoid NSAID's - Ambulatory referral to Gastroenterology - omeprazole (PRILOSEC) 40 MG capsule; Take 1 capsule (40 mg total) by mouth daily.  Dispense: 90 capsule; Refill: 3  5. Former smoker - Ambulatory referral to Gastroenterology  6. History of smoking greater than 50 pack years   Follow up in 2 months for chronic follow up   Tommas Fragmin, FNP

## 2023-07-24 NOTE — Addendum Note (Signed)
 Addended by: Tommas Fragmin A on: 07/24/2023 01:51 PM   Modules accepted: Orders

## 2023-07-24 NOTE — Telephone Encounter (Signed)
 Prescription sent to pharmacy.

## 2023-07-26 NOTE — Telephone Encounter (Signed)
 Will meet with patient in June to determine

## 2023-07-26 NOTE — Progress Notes (Signed)
 Care Guide Pharmacy Note  07/26/2023 Name: Ryan Mcintyre MRN: [id number] DOB: 1955/06/07  Referred By: Yevette Hem, FNP Reason for referral: Complex Care Management (Outreach to schedule with Pharm d )   Ryan Mcintyre is a 68 y.o. year old male who is a primary care patient of Yevette Hem, FNP.  Ryan Mcintyre was referred to the pharmacist for assistance related to: DMII  Successful contact was made with the patient to discuss pharmacy services including being ready for the pharmacist to call at least 5 minutes before the scheduled appointment time and to have medication bottles and any blood pressure readings ready for review. The patient agreed to meet with the pharmacist via telephone visit on (date/time).08/17/2023  Lenton Rail , RMA     [place]  [facility] Guide  Direct Dial : [phone]  Website: [email or web address]

## 2023-08-08 DIAGNOSIS — E663 Overweight: Secondary | ICD-10-CM | POA: Diagnosis not present

## 2023-08-08 DIAGNOSIS — E1169 Type 2 diabetes mellitus with other specified complication: Secondary | ICD-10-CM | POA: Diagnosis not present

## 2023-08-08 DIAGNOSIS — I10 Essential (primary) hypertension: Secondary | ICD-10-CM | POA: Diagnosis not present

## 2023-08-08 DIAGNOSIS — J449 Chronic obstructive pulmonary disease, unspecified: Secondary | ICD-10-CM | POA: Diagnosis not present

## 2023-08-08 DIAGNOSIS — Z6828 Body mass index (BMI) 28.0-28.9, adult: Secondary | ICD-10-CM | POA: Diagnosis not present

## 2023-08-08 DIAGNOSIS — E785 Hyperlipidemia, unspecified: Secondary | ICD-10-CM | POA: Diagnosis not present

## 2023-08-08 DIAGNOSIS — Z008 Encounter for other general examination: Secondary | ICD-10-CM | POA: Diagnosis not present

## 2023-08-16 ENCOUNTER — Other Ambulatory Visit: Payer: Self-pay

## 2023-08-16 ENCOUNTER — Other Ambulatory Visit: Payer: Self-pay | Admitting: *Deleted

## 2023-08-16 NOTE — Patient Instructions (Signed)
 Visit Information  Thank you for taking time to visit with me today. Please don't hesitate to contact me if I can be of assistance to you before our next scheduled appointment.  Your next care management appointment is by telephone on 09/14/23  at 9:30 am  Telephone follow-up in 1 month  Please call the care guide team at [phone] if you need to cancel, schedule, or reschedule an appointment.   Please call the Suicide and Crisis Lifeline: 988 call the [place]  National Suicide Prevention Lifeline: 662-588-0128 or TTY: 984-872-7349 TTY 858 768 4114) to talk to a trained counselor call 1-800-273-TALK (toll free, 24 hour hotline) call the [facility] (360) 239-9326 call 911 if you are experiencing a Mental Health or Behavioral Health Crisis or need someone to talk to. Gilberto Labella, MSN, RN   [facility] RN Care Manager Direct Dial : [phone] Fax: [phone]  Hypertension, Adult Hypertension is another name for high blood pressure. High blood pressure forces your heart to work harder to pump blood. This can cause problems over time. There are two numbers in a blood pressure reading. There is a top number (systolic) over a bottom number (diastolic). It is best to have a blood pressure that is below 120/80. What are the causes? The cause of this condition is not known. Some other conditions can lead to high blood pressure. What increases the risk? Some lifestyle factors can make you more likely to develop high blood pressure: Smoking. Not getting enough exercise or physical activity. Being overweight. Having too much fat, sugar, calories, or salt (sodium) in your diet. Drinking too much alcohol. Other risk factors include: Having any of these conditions: Heart disease. Diabetes. High cholesterol. Kidney disease. Obstructive sleep apnea. Having a family history of high blood pressure and high cholesterol. Age.  The risk increases with age. Stress. What are the signs or symptoms? High blood pressure may not cause symptoms. Very high blood pressure (hypertensive crisis) may cause: Headache. Fast or uneven heartbeats (palpitations). Shortness of breath. Nosebleed. Vomiting or feeling like you may vomit (nauseous). Changes in how you see. Very bad chest pain. Feeling dizzy. Seizures. How is this treated? This condition is treated by making healthy lifestyle changes, such as: Eating healthy foods. Exercising more. Drinking less alcohol. Your doctor may prescribe medicine if lifestyle changes do not help enough and if: Your top number is above 130. Your bottom number is above 80. Your personal target blood pressure may vary. Follow these instructions at home: Eating and drinking  If told, follow the DASH eating plan. To follow this plan: Fill one half of your plate at each meal with fruits and vegetables. Fill one fourth of your plate at each meal with whole grains. Whole grains include whole-wheat pasta, brown rice, and whole-grain bread. Eat or drink low-fat dairy products, such as skim milk or low-fat yogurt. Fill one fourth of your plate at each meal with low-fat (lean) proteins. Low-fat proteins include fish, chicken without skin, eggs, beans, and tofu. Avoid fatty meat, cured and processed meat, or chicken with skin. Avoid pre-made or processed food. Limit the amount of salt in your diet to less than 1,500 mg each day. Do not drink alcohol if: Your doctor tells you not to drink. You are pregnant, may be pregnant, or are planning to become pregnant. If you drink alcohol: Limit how much you have to: 0-1 drink a day for women. 0-2 drinks a day for men. Know how much alcohol is in your  drink. In the [place] one drink equals one 12 oz bottle of beer (355 mL), one 5 oz glass of wine (148 mL), or one 1 oz glass of hard liquor (44 mL). Lifestyle  Work with your doctor to stay at a healthy  weight or to lose weight. Ask your doctor what the best weight is for you. Get at least 30 minutes of exercise that causes your heart to beat faster (aerobic exercise) most days of the week. This may include walking, swimming, or biking. Get at least 30 minutes of exercise that strengthens your muscles (resistance exercise) at least 3 days a week. This may include lifting weights or doing Pilates. Do not smoke or use any products that contain nicotine or tobacco. If you need help quitting, ask your doctor. Check your blood pressure at home as told by your doctor. Keep all follow-up visits. Medicines Take over-the-counter and prescription medicines only as told by your doctor. Follow directions carefully. Do not skip doses of blood pressure medicine. The medicine does not work as well if you skip doses. Skipping doses also puts you at risk for problems. Ask your doctor about side effects or reactions to medicines that you should watch for. Contact a doctor if: You think you are having a reaction to the medicine you are taking. You have headaches that keep coming back. You feel dizzy. You have swelling in your ankles. You have trouble with your vision. Get help right away if: You get a very bad headache. You start to feel mixed up (confused). You feel weak or numb. You feel faint. You have very bad pain in your: Chest. Belly (abdomen). You vomit more than once. You have trouble breathing. These symptoms may be an emergency. Get help right away. Call 911. Do not wait to see if the symptoms will go away. Do not drive yourself to the hospital. Summary Hypertension is another name for high blood pressure. High blood pressure forces your heart to work harder to pump blood. For most people, a normal blood pressure is less than 120/80. Making healthy choices can help lower blood pressure. If your blood pressure does not get lower with healthy choices, you may need to take medicine. This  information is not intended to replace advice given to you by your health care provider. Make sure you discuss any questions you have with your health care provider. Document Revised: 12/09/2020 Document Reviewed: 12/09/2020 Elsevier Patient Education  2024 Elsevier Inc.   Warning Signs of a Stroke A stroke is a Midwife. It should be treated right away. A stroke happens when there is not enough blood flow to the brain. A stroke can lead to brain damage and death. But if a person gets treated right away, they have a better chance of surviving and recovering. It is very important to recognize the symptoms of a stroke. What types of strokes are there? There are two main types of strokes: Ischemic stroke. This is the most common type. It happens when a blood vessel that sends blood to the brain is blocked. Hemorrhagic stroke. This happens when there is bleeding in the brain. This may be from a blood vessel leaking or bursting. A transient ischemic attack (TIA) causes the same symptoms as a stroke. But the symptoms go away quickly and do not cause lasting damage to the brain. TIAs still need to be treated right away. They are also a sign that you are at higher risk for a stroke. What are the warning  signs of a stroke? The symptoms of a stroke may differ based on the part of the brain that is involved. Symptoms often happen all of a sudden. BE FAST symptoms BE FAST is an easy way to remember the main warning signs of a stroke: B - Balance. Signs are dizziness, sudden trouble walking, or loss of balance. E - Eyes. Signs are trouble seeing or a sudden change in vision. F - Face. Signs are sudden weakness or numbness of the face, or the face or eyelid drooping on one side. A - Arms. Signs are weakness or numbness in an arm. This happens suddenly and usually on one side of the body. S - Speech. Signs are sudden trouble speaking, slurred speech, or trouble understanding what people say. T -  Time. Time to call emergency services. Write down what time symptoms started. A stroke may be happening even if only one BE FAST symptom is present. Other signs of a stroke Some less common signs of a stroke include: A sudden, severe headache with no known cause. Nausea or vomiting. Seizure. These symptoms may be an emergency. Get help right away. Call 911. Do not wait to see if the symptoms will go away. Do not drive yourself to the hospital.  This information is not intended to replace advice given to you by your health care provider. Make sure you discuss any questions you have with your health care provider. Document Revised: 12/05/2021 Document Reviewed: 12/05/2021 Elsevier Patient Education  2024 [employer].

## 2023-08-16 NOTE — Patient Outreach (Signed)
 Complex Care Management   Visit Note  08/16/2023  Name:  Ryan Mcintyre MRN: [id number] DOB: May 30, 1955  Situation: Referral received for Complex Care Management related to COPD and Diabetes with Complications I obtained verbal consent from Patient.  Visit completed with patient  on the phone  Background:   Past Medical History:  Diagnosis Date   COPD (chronic obstructive pulmonary disease) (HCC) 02/21/2017   Diabetes mellitus without complication (HCC)    GERD (gastroesophageal reflux disease)    Hyperlipidemia     Assessment: Patient Reported Symptoms:  Cognitive Cognitive Status: Alert and oriented to person, place, and time   Health Maintenance Behaviors: Annual physical exam, Exercise, Sleep adequate, Social activities, Healthy diet Healing Pattern: Fast Health Facilitated by: Rest, Healthy diet  Neurological Neurological Review of Symptoms: No symptoms reported    HEENT HEENT Symptoms Reported: No symptoms reported      Cardiovascular Cardiovascular Symptoms Reported: No symptoms reported Does patient have uncontrolled Hypertension?: No Cardiovascular Conditions: High blood cholesterol, Hypertension Cardiovascular Management Strategies: Activity, Adequate rest, Exercise, Medication therapy, Routine screening  Respiratory Respiratory Symptoms Reported: No symptoms reported    Endocrine Patient reports the following symptoms related to hypoglycemia or hyperglycemia : No symptoms reported Is patient diabetic?: Yes Is patient checking blood sugars at home?: Yes Endocrine Conditions: Diabetes Endocrine Management Strategies: Activity, Diet modification, Exercise, Routine screening, Medication therapy Endocrine Self-Management Outcome: 4 (good)  Gastrointestinal Gastrointestinal Symptoms Reported: No symptoms reported   Nutrition Risk Screen (CP): No indicators present  Genitourinary Genitourinary Symptoms Reported: No symptoms reported    Integumentary Integumentary  Symptoms Reported: No symptoms reported    Musculoskeletal Musculoskelatal Symptoms Reviewed: No symptoms reported   Falls in the past year?: No (Patient reports was choking due to GERD - did not fall but went down to ground) Number of falls in past year: 1 or less Was there an injury with Fall?: No Fall Risk Category Calculator: 0 Patient Fall Risk Level: Low Fall Risk Patient at Risk for Falls Due to: History of fall(s) Fall risk Follow up: Falls evaluation completed  Psychosocial Psychosocial Symptoms Reported: No symptoms reported     Quality of Family Relationships: helpful, involved, supportive Do you feel physically threatened by others?: No      08/16/2023   10:58 AM  Depression screen PHQ 2/9  Decreased Interest 0  Down, Depressed, Hopeless 0  PHQ - 2 Score 0    Vitals:   08/16/23 1038  BP: (!) 172/90    Medications Reviewed Today   Medications were not reviewed in this encounter     Recommendation:   PCP Follow-up Lab requests: A1C needed Continue Current Plan of Care  Follow Up Plan:   Telephone follow-up in 1 month  Gilberto Labella, MSN, RN [facility]  [facility], [facility] RN Care Manager Direct Dial : [phone] Fax: [phone] sig

## 2023-08-16 NOTE — Progress Notes (Signed)
 08/17/2023 Name: Ryan Mcintyre MRN: [id number] DOB: Dec 11, 1955  Chief Complaint  Patient presents with   Diabetes   Hyperlipidemia   Hypertension    Ryan Mcintyre is a 68 y.o. year old male who presented for a telephone visit. I connected with  PAIGE VANDERWOUDE on 08/17/23 by telephone and verified that I am speaking with the correct person using two identifiers. I discussed the limitations of evaluation and management by telemedicine. The patient expressed understanding and agreed to proceed.  Patient was located in her home and PharmD in PCP office during this visit.   They were referred to the pharmacist by their PCP for assistance in managing diabetes and hyperlipidemia.   Care Team: Primary Care Provider: Yevette Hem, FNP ; Next Scheduled Visit: 08/31/23  Medication Access/Adherence  Current Pharmacy:  [facility] [address] [place] - [address] [place] [address] Phone: [phone] Fax: [phone]  [facility] - [place], [place] - [address] [place] [address] Phone: [phone] Fax: [phone]   Patient reports affordability concerns with their medications: No  Patient reports access/transportation concerns to their pharmacy: No  Patient reports adherence concerns with their medications:  Yes    Subjective: Patient reports he ran out of Farxiga  about 1-2 weeks ago and atorvastatin  a couple days ago. Endorses adherence to metformin , glimepiride , and lisinopril  as prescribed. Notes he has episodes of hypoglycemia occurring around 2 times per week during the day time (usually ~60s). Treats with a cookie or snack and BG improves. Also says that he will go outside for periods of time without his reader device. He started checking his BP at home and expressed concern over high reading (172/90) the other day. He says he was working outside when he checked it. Of note, he says choking/belching has resolved since  omeprazole  dose was increased a few weeks ago.  Diabetes:  Current medications: Farxiga  10 mg daily, glimepiride  2 mg daily, metformin  1000 mg BID Medications tried in the past: Ozempic  (diarrhea, stomach ache), insulin  glargine 12 units at bedtime     Jardiance  10 mg daily (2019-2023)  Libre 3+ CGM (uses reader) % Time CGM is active: 53% Average Glucose: 139 mg/dL Time in Goal:  - Time in range 70-180: 80% - Time above range: 17% - Time below range: 3% Observed patterns: hypoglycemia occurring ~2x per week during day time  Patient denies hypoglycemic s/sx including dizziness, shakiness, sweating. Patient denies hyperglycemic symptoms including polyuria, polydipsia, polyphagia, nocturia, neuropathy, blurred vision.  Current meal patterns:  - 3 meals/day - Breakfast: eggs and bacon - Lunch: sandwich meat (no bread) - Supper: mac n cheese, spaghetti - Snacks: crackers - Drinks: water  Current physical activity: encouraged as able  Current medication access support: needs [employer] PAP for Farxiga  re-enrollment  Omeprazole  helped choking - no problems since  Hyperlipidemia/ASCVD Risk Reduction  Current lipid lowering medications: atorvastatin  10 mg daily Medications tried in the past: rosuvastatin   ASCVD History: none Risk Factors: T2DM, HTN, HLD  Current medication access support: Medicare  Hypertension:  Current medications: lisinopril  20 mg daily  Patient has a validated, automated, upper arm home BP cuff Current blood pressure readings readings: 172/90  Objective:  Lab Results  Component Value Date   HGBA1C 7.2 (H) 12/26/2022    Lab Results  Component Value Date   CREATININE 1.07 01/12/2023   BUN 18 01/12/2023   NA 142 01/12/2023  K 4.9 01/12/2023   CL 101 01/12/2023   CO2 27 01/12/2023    Lab Results  Component Value Date   CHOL 158 10/17/2022   HDL 30 (L) 10/17/2022   LDLCALC 95 10/17/2022   LDLDIRECT 83 12/30/2014   TRIG 192 (H) 10/17/2022    CHOLHDL 5.3 (H) 10/17/2022    Medications Reviewed Today     Reviewed by Philmore Bream, RPH (Pharmacist) on 08/17/23 at 0855  Med List Status: <None>   Medication Order Taking? Sig Documenting Provider Last Dose Status Informant  atorvastatin  (LIPITOR) 10 MG tablet [id number] Yes Take 10 mg by mouth daily. [provider]  Active   blood glucose meter kit and supplies [id number]  Dispense based on patient and insurance preference. Use up to four times daily as directed. (FOR ICD-10 E10.9, E11.9). Hawks, Christy A, FNP  Active   Continuous Glucose Sensor (FREESTYLE LIBRE 3 PLUS SENSOR) MISC [id number]  USE AS DIRECTED CHANGE EVERY 15 DAYS TO MONITOR BLOOD GLUCOSE CONTINUOUSLY [provider]  Active   dapagliflozin  propanediol (FARXIGA ) 10 MG TABS tablet [id number]  Take 1 tablet (10 mg total) by mouth daily before breakfast.  Patient not taking: Reported on 08/16/2023   Yevette Hem, FNP  Active   EQ ASPIRIN  ADULT LOW DOSE 81 MG tablet [id number]  TAKE 1 TABLET BY MOUTH ONCE DAILY. SWALLOW WHOLE Hawks, Christy A, FNP  Active   glimepiride  (AMARYL ) 2 MG tablet [id number] Yes Take 1 tablet (2 mg total) by mouth daily with breakfast. **NEEDS TO BE SEEN BEFORE NEXT REFILL** Tommas Fragmin A, FNP  Active   glucose blood test strip [id number]  Test daily. Tommas Fragmin A, FNP  Active   guaifenesin  (HUMIBID E) 400 MG TABS tablet [id number]  Take 1 tablet (400 mg total) by mouth every 6 (six) hours as needed.  Patient not taking: Reported on 07/24/2023   Anton Baton, NP  Active   lisinopril  (ZESTRIL ) 20 MG tablet [id number] Yes Take 1 tablet (20 mg total) by mouth daily. Tommas Fragmin A, FNP  Active   metFORMIN  (GLUCOPHAGE ) 1000 MG tablet [id number] Yes Take 1 tablet (1,000 mg total) by mouth 2 (two) times daily with a meal. **NEEDS TO BE SEEN BEFORE NEXT REFILL** Hawks, Christy A, FNP  Active   omeprazole  (PRILOSEC) 40 MG capsule [id number] Yes Take 1 capsule (40 mg total)  by mouth daily. Tommas Fragmin A, FNP  Active   sildenafil  (VIAGRA ) 100 MG tablet [id number]  Take 0.5-1 tablets (50-100 mg total) by mouth daily as needed for erectile dysfunction.  Patient not taking: Reported on 08/16/2023   Tommas Fragmin A, FNP  Active   trimethoprim -polymyxin b  (POLYTRIM ) ophthalmic solution [id number]  Place 2 drops into the right eye every 4 (four) hours.  Patient not taking: Reported on 08/16/2023   Yevette Hem, FNP  Active               Assessment/Plan:   Diabetes: - Currently uncontrolled based on last A1C 7.2% on 12/26/22, above goal <7%. However, he is due for an updated A1C. Libre reveals TIR 80% and time below 3% which is consistent with patient reported hypoglycemia episodes. Recommend to discontinue glimepiride  as this is likely contributing to hypoglycemia. Time CGM active only 53% due to patient going outside for long periods of time without his reader. Counseled on importance of bringing his reader with him in order to capture BG data and alert him. Will collaborate with CPhT to re-enroll him  in Farxiga  patient assistance program. Provided him with information for 1 month free card to bridge medication supply. - Reviewed long term cardiovascular and renal outcomes of uncontrolled blood sugar - Reviewed goal A1c, goal fasting, and goal 2 hour post prandial glucose - Reviewed appropriate treatment of hypoglycemia - Reviewed dietary modifications including reviewed my plate method  - Recommend to discontinue glimepiride  - Recommend to continue Farxiga  10 mg daily - Recommend to continue metformin  1000 mg twice daily  - Patient denies personal or family history of multiple endocrine neoplasia type 2, medullary thyroid  cancer; personal history of pancreatitis or gallbladder disease. - Recommend to check glucose continuously with Libre 3+ - Meets financial criteria for Farxiga  patient assistance program through [employer]. Will collaborate with provider, CPhT,  and patient to pursue re-enrollment. - A1C due at upcoming PCP visit    Hypertension: - Currently controlled based on last clinic BP reading 120/71, below goal <130/80. Patient reported home BP reading likely elevated as he was outside working when he checked. Reviewed appropriate BP check technique and recommend to continue home monitoring. - Reviewed long term cardiovascular and renal outcomes of uncontrolled blood pressure - Reviewed appropriate blood pressure monitoring technique and reviewed goal blood pressure. Recommended to check home blood pressure and heart rate. - Recommend to continue lisinopril  20 mg daily     Hyperlipidemia/ASCVD Risk Reduction: - Currently uncontrolled based on last lipid panel with LDL 95 mg/dL, above goal <16 mg/dL given X0RU with additional risk factors HTN, hepatic steatosis, former smoker. TG also elevated at 192 mg/dL. Reports he is tolerating atorvastatin  well. Appropriate to increase to high intensity today and he was amenable to this. - Reviewed long term complications of uncontrolled cholesterol - Recommend to increase atorvastatin  to 40 mg daily  - Repeat lipid panel due in 2-3 months   Follow Up Plan: PCP on 08/31/23 and PharmD on 09/28/23  Georga Killings, PharmD PGY-1 Pharmacy Resident  Marvell Slider, PharmD, BCACP, CPP Clinical Pharmacist, [facility]

## 2023-08-17 ENCOUNTER — Encounter: Payer: Self-pay | Admitting: Pharmacist

## 2023-08-17 ENCOUNTER — Telehealth: Payer: Self-pay | Admitting: Pharmacist

## 2023-08-17 ENCOUNTER — Other Ambulatory Visit (HOSPITAL_COMMUNITY): Payer: Self-pay

## 2023-08-17 ENCOUNTER — Other Ambulatory Visit (INDEPENDENT_AMBULATORY_CARE_PROVIDER_SITE_OTHER): Admitting: Pharmacist

## 2023-08-17 DIAGNOSIS — Z7984 Long term (current) use of oral hypoglycemic drugs: Secondary | ICD-10-CM

## 2023-08-17 DIAGNOSIS — I152 Hypertension secondary to endocrine disorders: Secondary | ICD-10-CM

## 2023-08-17 DIAGNOSIS — E119 Type 2 diabetes mellitus without complications: Secondary | ICD-10-CM

## 2023-08-17 DIAGNOSIS — E1169 Type 2 diabetes mellitus with other specified complication: Secondary | ICD-10-CM

## 2023-08-17 DIAGNOSIS — E1159 Type 2 diabetes mellitus with other circulatory complications: Secondary | ICD-10-CM

## 2023-08-17 DIAGNOSIS — E785 Hyperlipidemia, unspecified: Secondary | ICD-10-CM

## 2023-08-17 MED ORDER — ATORVASTATIN CALCIUM 40 MG PO TABS
40.0000 mg | ORAL_TABLET | Freq: Every day | ORAL | 3 refills | Status: AC
Start: 2023-08-17 — End: ?

## 2023-08-17 MED ORDER — DAPAGLIFLOZIN PROPANEDIOL 10 MG PO TABS: 10.0000 mg | ORAL_TABLET | Freq: Every day | ORAL | 0 refills | Status: DC

## 2023-08-17 NOTE — Telephone Encounter (Signed)
 Hey! Patient needs re-enrollment in Farxiga  patient assistance. Thank you for your help!

## 2023-08-20 ENCOUNTER — Telehealth: Payer: Self-pay

## 2023-08-20 DIAGNOSIS — E119 Type 2 diabetes mellitus without complications: Secondary | ICD-10-CM

## 2023-08-20 NOTE — Progress Notes (Addendum)
 Pharmacy Medication Assistance Program Note    10/03/2023  Patient ID: Ryan Mcintyre, male  DOB: Dec 28, 1955, 68 y.o.  MRN:  [id number]     08/20/2023  Outreach Medication Two  Manufacturer Medication Two [employer]  [employer] [facility] Farxiga   Dose of Farxiga  10MG   Type of Radiographer, therapeutic Assistance  Date Application Sent to Patient 08/21/2023  Application Items Requested Application  Date Application Received From Patient 09/14/2023  Application Items Received From Patient Application  Method Application Sent to Manufacturer Fax  Date Application Submitted to Manufacturer 09/18/2023  Patient Assistance Determination Approved     NEW - APPROVED

## 2023-08-31 ENCOUNTER — Ambulatory Visit (INDEPENDENT_AMBULATORY_CARE_PROVIDER_SITE_OTHER): Admitting: Family

## 2023-08-31 ENCOUNTER — Encounter: Payer: Self-pay | Admitting: Family

## 2023-08-31 VITALS — BP 137/75 | HR 82 | Temp 97.0°F | Ht 66.0 in | Wt 174.2 lb

## 2023-08-31 DIAGNOSIS — E1159 Type 2 diabetes mellitus with other circulatory complications: Secondary | ICD-10-CM | POA: Diagnosis not present

## 2023-08-31 DIAGNOSIS — Z87891 Personal history of nicotine dependence: Secondary | ICD-10-CM | POA: Diagnosis not present

## 2023-08-31 DIAGNOSIS — E1169 Type 2 diabetes mellitus with other specified complication: Secondary | ICD-10-CM

## 2023-08-31 DIAGNOSIS — E663 Overweight: Secondary | ICD-10-CM

## 2023-08-31 DIAGNOSIS — K760 Fatty (change of) liver, not elsewhere classified: Secondary | ICD-10-CM | POA: Diagnosis not present

## 2023-08-31 DIAGNOSIS — E785 Hyperlipidemia, unspecified: Secondary | ICD-10-CM

## 2023-08-31 DIAGNOSIS — K219 Gastro-esophageal reflux disease without esophagitis: Secondary | ICD-10-CM | POA: Diagnosis not present

## 2023-08-31 DIAGNOSIS — I700 Atherosclerosis of aorta: Secondary | ICD-10-CM | POA: Diagnosis not present

## 2023-08-31 DIAGNOSIS — I152 Hypertension secondary to endocrine disorders: Secondary | ICD-10-CM | POA: Diagnosis not present

## 2023-08-31 DIAGNOSIS — J410 Simple chronic bronchitis: Secondary | ICD-10-CM

## 2023-08-31 LAB — CMP14+EGFR
ALT: 21 IU/L (ref 0–44)
AST: 13 IU/L (ref 0–40)
Albumin: 4.4 g/dL (ref 3.9–4.9)
Alkaline Phosphatase: 64 IU/L (ref 44–121)
BUN/Creatinine Ratio: 17 (ref 10–24)
BUN: 19 mg/dL (ref 8–27)
Bilirubin Total: 0.3 mg/dL (ref 0.0–1.2)
CO2: 24 mmol/L (ref 20–29)
Calcium: 9.6 mg/dL (ref 8.6–10.2)
Chloride: 106 mmol/L (ref 96–106)
Creatinine, Ser: 1.14 mg/dL (ref 0.76–1.27)
Globulin, Total: 2.4 g/dL (ref 1.5–4.5)
Glucose: 131 mg/dL — ABNORMAL HIGH (ref 70–99)
Potassium: 4.8 mmol/L (ref 3.5–5.2)
Sodium: 144 mmol/L (ref 134–144)
Total Protein: 6.8 g/dL (ref 6.0–8.5)
eGFR: 69 mL/min/{1.73_m2} (ref >59)

## 2023-08-31 LAB — BAYER DCA HB A1C WAIVED: HB A1C (BAYER DCA - WAIVED): 6.9 % — ABNORMAL HIGH (ref 4.8–5.6)

## 2023-08-31 NOTE — Progress Notes (Signed)
 Subjective:    Patient ID: Ryan Mcintyre, male    DOB: 1955/05/22, 68 y.o.   MRN: [id number]  Chief Complaint  Patient presents with   Follow-up    Patient wants to due A1c today    Pt presents to the office today for CPE and chronic follow up.   He has COPD and quit smoking 08/2022.  Denies any SOB. Reports he smoked for 57 years. States he is riding his bike and hiking.    He had ED and takes viagra  as needed   He has aortic atherosclerosis is staking Crestor  5 mg, but has myalgia on the 10 mg.   Complaining of skin lesion on his scalp that has been there for years but seems like it has become larger. Denies any pain, redness, or discharge. Gastroesophageal Reflux He complains of belching and heartburn. He reports no choking. This is a chronic problem. The current episode started more than 1 year ago. The problem occurs frequently. The symptoms are aggravated by certain foods. He has tried a PPI for the symptoms. The treatment provided moderate relief.  Hyperlipidemia This is a chronic problem. The current episode started more than 1 year ago. The problem is uncontrolled. Recent lipid tests were reviewed and are high. Exacerbating diseases include obesity. Current antihyperlipidemic treatment includes statins. The current treatment provides moderate improvement of lipids. Risk factors for coronary artery disease include dyslipidemia, diabetes mellitus, hypertension, male sex and a sedentary lifestyle.  Diabetes He presents for his follow-up diabetic visit. He has type 2 diabetes mellitus. Pertinent negatives for diabetes include no blurred vision and no foot paresthesias. Symptoms are stable. Risk factors for coronary artery disease include dyslipidemia, diabetes mellitus, hypertension and sedentary lifestyle. He is following a generally healthy diet. His overall blood glucose range is 110-130 mg/dl. Eye exam is current.      Review of Systems  Eyes:  Negative for blurred vision.   Respiratory:  Negative for choking.   Gastrointestinal:  Positive for heartburn.  All other systems reviewed and are negative.  Family History  Problem Relation Age of Onset   Hypertension Mother    Diabetes Mother    Diabetes Sister    Early death Sister    Early death Brother    Early death Brother    Social History   Socioeconomic History   Marital status: Married    Spouse name: Mary   Number of children: 3   Years of education: 12   Highest education level: 12th grade  Occupational History   Occupation: Designer, fashion/clothing    Comment: Disabled  Tobacco Use   Smoking status: Former    Current packs/day: 0.00    Average packs/day: 0.3 packs/day for 50.0 years (12.5 ttl pk-yrs)    Types: Cigarettes    Start date: 04/06/1970    Quit date: 04/06/2020    Years since quitting: 3.4   Smokeless tobacco: Never   Tobacco comments:    about 3 to 4 cigarettes a day - finally quit completely  Vaping Use   Vaping status: Never Used  Substance and Sexual Activity   Alcohol use: No    Alcohol/week: 0.0 standard drinks of alcohol   Drug use: No   Sexual activity: Yes  Other Topics Concern   Not on file  Social History Narrative   Lives on one level home with wife   They enjoy going dancing every Thursday night   Social Drivers of Health   Financial Resource Strain: Low Risk  (  10/02/2022)   Overall Financial Resource Strain (CARDIA)    Difficulty of Paying Living Expenses: Not hard at all  Food Insecurity: No Food Insecurity (08/16/2023)   Hunger Vital Sign    Worried About Running Out of Food in the Last Year: Never true    Ran Out of Food in the Last Year: Never true  Transportation Needs: No Transportation Needs (08/16/2023)   PRAPARE - Administrator, Civil Service (Medical): No    Lack of Transportation (Non-Medical): No  Physical Activity: Sufficiently Active (04/09/2023)   Exercise Vital Sign    Days of Exercise per Week: 7 days    Minutes of Exercise per Session: 30  min  Stress: No Stress Concern Present (10/02/2022)   [employer] of Occupational Health - Occupational Stress Questionnaire    Feeling of Stress : Not at all  Social Connections: Moderately Integrated (10/02/2022)   Social Connection and Isolation Panel    Frequency of Communication with Friends and Family: More than three times a week    Frequency of Social Gatherings with Friends and Family: More than three times a week    Attends Religious Services: More than 4 times per year    Active Member of [employer] or Organizations: No    Attends Banker Meetings: Never    Marital Status: Married       Objective:   Physical Exam Vitals reviewed.  Constitutional:      General: He is not in acute distress.    Appearance: He is well-developed.  HENT:     Head: Normocephalic.     Right Ear: Tympanic membrane normal.     Left Ear: Tympanic membrane normal.   Eyes:     General:        Right eye: No discharge.        Left eye: No discharge.     Pupils: Pupils are equal, round, and reactive to light.   Neck:     Thyroid : No thyromegaly.   Cardiovascular:     Rate and Rhythm: Normal rate and regular rhythm.     Heart sounds: Normal heart sounds. No murmur heard. Pulmonary:     Effort: Pulmonary effort is normal. No respiratory distress.     Breath sounds: Normal breath sounds. No wheezing.  Abdominal:     General: Bowel sounds are normal. There is no distension.     Palpations: Abdomen is soft.     Tenderness: There is no abdominal tenderness.   Musculoskeletal:        General: No tenderness. Normal range of motion.     Cervical back: Normal range of motion and neck supple.   Skin:    General: Skin is warm and dry.     Findings: No erythema, lesion or rash.   Neurological:     Mental Status: He is alert and oriented to person, place, and time.     Cranial Nerves: No cranial nerve deficit.     Deep Tendon Reflexes: Reflexes are normal and symmetric.    Psychiatric:        Behavior: Behavior normal.        Thought Content: Thought content normal.        Judgment: Judgment normal.     BP 137/75   Pulse 82   Temp (!) 97 F (36.1 C)   Ht 5' 6 (1.676 m)   Wt 174 lb 3.2 oz (79 kg)   SpO2 93%   BMI 28.12 kg/m  Assessment & Plan:  Ryan Mcintyre comes in today with chief complaint of Follow-up (Patient wants to due A1c today )   Diagnosis and orders addressed:  1. Type 2 diabetes mellitus with other specified complication, without long-term current use of insulin  (HCC) (Primary) - Bayer DCA Hb A1c Waived - CMP14+EGFR  2. Gastroesophageal reflux disease, unspecified whether esophagitis present - CMP14+EGFR  3. Hyperlipidemia associated with type 2 diabetes mellitus (HCC) - CMP14+EGFR  4. Overweight (BMI 25.0-29.9) - CMP14+EGFR  5. Hepatic steatosis - CMP14+EGFR  6. Simple chronic bronchitis (HCC)  - CMP14+EGFR  7. History of smoking greater than 50 pack years - CMP14+EGFR  8. Aortic atherosclerosis (HCC) - CMP14+EGFR  9. Hypertension associated with diabetes (HCC)  - CMP14+EGFR  10. Former smoker - CMP14+EGFR   Labs pending Continue current medications  Low carb Health Maintenance reviewed Diet and exercise encouraged  Follow up plan: 4 months    Bari Learn, FNP

## 2023-08-31 NOTE — Patient Instructions (Signed)

## 2023-09-04 ENCOUNTER — Ambulatory Visit: Payer: Self-pay | Admitting: Family

## 2023-09-14 ENCOUNTER — Encounter: Payer: Self-pay | Admitting: *Deleted

## 2023-09-14 ENCOUNTER — Telehealth: Payer: Self-pay | Admitting: *Deleted

## 2023-09-18 MED ORDER — DAPAGLIFLOZIN PROPANEDIOL 10 MG PO TABS: 10.0000 mg | ORAL_TABLET | Freq: Every day | ORAL | 3 refills | Status: AC

## 2023-09-18 NOTE — Telephone Encounter (Signed)
 Prescription sent to pharmacy.

## 2023-09-18 NOTE — Telephone Encounter (Signed)
 Application submitted.   Please send a 90 day RX with refills of Farxiga  to [facility] pharmacy for patient assistance enrollment. Thanks!

## 2023-09-28 ENCOUNTER — Other Ambulatory Visit

## 2023-10-03 ENCOUNTER — Other Ambulatory Visit

## 2023-10-03 ENCOUNTER — Other Ambulatory Visit: Payer: Self-pay | Admitting: Family

## 2023-10-03 ENCOUNTER — Ambulatory Visit: Payer: No Typology Code available for payment source

## 2023-10-03 VITALS — BP 137/75 | HR 82 | Ht 66.0 in | Wt 174.0 lb

## 2023-10-03 DIAGNOSIS — Z0000 Encounter for general adult medical examination without abnormal findings: Secondary | ICD-10-CM

## 2023-10-03 DIAGNOSIS — Z7984 Long term (current) use of oral hypoglycemic drugs: Secondary | ICD-10-CM

## 2023-10-03 DIAGNOSIS — E1169 Type 2 diabetes mellitus with other specified complication: Secondary | ICD-10-CM

## 2023-10-03 DIAGNOSIS — I700 Atherosclerosis of aorta: Secondary | ICD-10-CM

## 2023-10-03 DIAGNOSIS — E119 Type 2 diabetes mellitus without complications: Secondary | ICD-10-CM

## 2023-10-03 NOTE — Patient Instructions (Signed)
 Ryan Mcintyre , Thank you for taking time out of your busy schedule to complete your Annual Wellness Visit with me. I enjoyed our conversation and look forward to speaking with you again next year. I, as well as your care team,  appreciate your ongoing commitment to your health goals. Please review the following plan we discussed and let me know if I can assist you in the future. Your Game plan/ To Do List    Follow up Visits: Next Medicare AWV with our clinical staff: 10/03/24 8:40a.m.   Have you seen your provider in the last 6 months (3 months if uncontrolled diabetes)? Yes Next Office Visit with your provider: n/a  Clinician Recommendations:  Aim for 30 minutes of exercise or brisk walking, 6-8 glasses of water, and 5 servings of fruits and vegetables each day.       This is a list of the screening recommended for you and due dates:  Health Maintenance  Topic Date Due   Medicare Annual Wellness Visit  10/02/2023   Yearly kidney health urinalysis for diabetes  10/17/2023   Colon Cancer Screening  12/26/2023*   Flu Shot  10/05/2023   Complete foot exam   10/17/2023   Eye exam for diabetics  01/30/2024   Hemoglobin A1C  03/01/2024   Screening for Lung Cancer  05/24/2024   Yearly kidney function blood test for diabetes  08/30/2024   DTaP/Tdap/Td vaccine (2 - Td or Tdap) 09/27/2024   Pneumococcal Vaccine for age over 50  Completed   Hepatitis C Screening  Completed   Zoster (Shingles) Vaccine  Completed   Hepatitis B Vaccine  Aged Out   HPV Vaccine  Aged Out   Meningitis B Vaccine  Aged Out   COVID-19 Vaccine  Discontinued  *Topic was postponed. The date shown is not the original due date.    Advanced directives: (Declined) Advance directive discussed with you today. Even though you declined this today, please call our office should you change your mind, and we can give you the proper paperwork for you to fill out. Advance Care Planning is important because it:  [x]  Makes sure you  receive the medical care that is consistent with your values, goals, and preferences  [x]  It provides guidance to your family and loved ones and reduces their decisional burden about whether or not they are making the right decisions based on your wishes.  Follow the link provided in your after visit summary or read over the paperwork we have mailed to you to help you started getting your Advance Directives in place. If you need assistance in completing these, please reach out to us  so that we can help you!  See attachments for Preventive Care and Fall Prevention Tips.

## 2023-10-03 NOTE — Progress Notes (Signed)
 Subjective:   Ryan Mcintyre is a 68 y.o. who presents for a Medicare Wellness preventive visit.  As a reminder, Annual Wellness Visits don't include a physical exam, and some assessments may be limited, especially if this visit is performed virtually. We may recommend an in-person follow-up visit with your provider if needed.  Visit Complete: Virtual I connected with  ZALAN SHIDLER on 10/03/23 by a audio enabled telemedicine application and verified that I am speaking with the correct person using two identifiers.  Patient Location: Home  Provider Location: Home Office  I discussed the limitations of evaluation and management by telemedicine. The patient expressed understanding and agreed to proceed.  Vital Signs: Because this visit was a virtual/telehealth visit, some criteria may be missing or patient reported. Any vitals not documented were not able to be obtained and vitals that have been documented are patient reported.  VideoDeclined- This patient declined Librarian, academic. Therefore the visit was completed with audio only.  Persons Participating in Visit: Patient.  AWV Questionnaire: No: Patient Medicare AWV questionnaire was not completed prior to this visit.  Cardiac Risk Factors include: advanced age (>86men, >70 women);diabetes mellitus;dyslipidemia;hypertension;smoking/ tobacco exposure     Objective:    Today's Vitals   10/03/23 1002  BP: 137/75  Pulse: 82  Weight: 174 lb (78.9 kg)  Height: 5' 6 (1.676 m)   Body mass index is 28.08 kg/m.     10/03/2023   10:06 AM 08/16/2023   10:58 AM 07/19/2023   10:44 AM 06/19/2023   11:57 AM 10/02/2022   11:20 AM 03/21/2022    1:27 PM 03/21/2022    1:01 PM  Advanced Directives  Does Patient Have a Medical Advance Directive? No No No No No No No  Would patient like information on creating a medical advance directive?  Yes (MAU/Ambulatory/Procedural Areas - Information given) Yes  (MAU/Ambulatory/Procedural Areas - Information given) No - Patient declined Yes (MAU/Ambulatory/Procedural Areas - Information given) Yes (MAU/Ambulatory/Procedural Areas - Information given) No - Guardian declined    Current Medications (verified) Outpatient Encounter Medications as of 10/03/2023  Medication Sig   atorvastatin  (LIPITOR) 40 MG tablet Take 1 tablet (40 mg total) by mouth daily.   blood glucose meter kit and supplies Dispense based on patient and insurance preference. Use up to four times daily as directed. (FOR ICD-10 E10.9, E11.9).   Continuous Glucose Sensor (FREESTYLE LIBRE 3 PLUS SENSOR) MISC USE AS DIRECTED CHANGE EVERY 15 DAYS TO MONITOR BLOOD GLUCOSE CONTINUOUSLY   dapagliflozin  propanediol (FARXIGA ) 10 MG TABS tablet Take 1 tablet (10 mg total) by mouth daily before breakfast.   EQ ASPIRIN  ADULT LOW DOSE 81 MG tablet TAKE 1 TABLET BY MOUTH ONCE DAILY. SWALLOW WHOLE   glucose blood test strip Test daily.   guaifenesin  (HUMIBID E) 400 MG TABS tablet Take 1 tablet (400 mg total) by mouth every 6 (six) hours as needed.   lisinopril  (ZESTRIL ) 20 MG tablet Take 1 tablet (20 mg total) by mouth daily.   metFORMIN  (GLUCOPHAGE ) 1000 MG tablet Take 1 tablet (1,000 mg total) by mouth 2 (two) times daily with a meal. **NEEDS TO BE SEEN BEFORE NEXT REFILL**   omeprazole  (PRILOSEC) 40 MG capsule Take 1 capsule (40 mg total) by mouth daily.   sildenafil  (VIAGRA ) 100 MG tablet Take 0.5-1 tablets (50-100 mg total) by mouth daily as needed for erectile dysfunction. (Patient not taking: Reported on 10/03/2023)   No facility-administered encounter medications on file as of 10/03/2023.  Allergies (verified) Ozempic  (0.25 or 0.5 mg-dose) [semaglutide (0.25 or 0.5mg -dos)]   History: Past Medical History:  Diagnosis Date   COPD (chronic obstructive pulmonary disease) (HCC) 02/21/2017   Diabetes mellitus without complication (HCC)    GERD (gastroesophageal reflux disease)    Hyperlipidemia     Past Surgical History:  Procedure Laterality Date   CIRCUMCISION  1974   Family History  Problem Relation Age of Onset   Hypertension Mother    Diabetes Mother    Diabetes Sister    Early death Sister    Early death Brother    Early death Brother    Social History   Socioeconomic History   Marital status: Married    Spouse name: Mary   Number of children: 3   Years of education: 12   Highest education level: 12th grade  Occupational History   Occupation: Designer, fashion/clothing    Comment: Disabled  Tobacco Use   Smoking status: Former    Current packs/day: 0.00    Average packs/day: 0.3 packs/day for 50.0 years (12.5 ttl pk-yrs)    Types: Cigarettes    Start date: 04/06/1970    Quit date: 04/06/2020    Years since quitting: 3.4   Smokeless tobacco: Never   Tobacco comments:    about 3 to 4 cigarettes a day - finally quit completely  Vaping Use   Vaping status: Never Used  Substance and Sexual Activity   Alcohol use: No    Alcohol/week: 0.0 standard drinks of alcohol   Drug use: No   Sexual activity: Yes  Other Topics Concern   Not on file  Social History Narrative   Lives on one level home with wife   They enjoy going dancing every Thursday night   Social Drivers of Health   Financial Resource Strain: Low Risk  (10/03/2023)   Overall Financial Resource Strain (CARDIA)    Difficulty of Paying Living Expenses: Not hard at all  Food Insecurity: No Food Insecurity (10/03/2023)   Hunger Vital Sign    Worried About Running Out of Food in the Last Year: Never true    Ran Out of Food in the Last Year: Never true  Transportation Needs: No Transportation Needs (10/03/2023)   PRAPARE - Administrator, Civil Service (Medical): No    Lack of Transportation (Non-Medical): No  Physical Activity: Sufficiently Active (10/03/2023)   Exercise Vital Sign    Days of Exercise per Week: 7 days    Minutes of Exercise per Session: 30 min  Stress: No Stress Concern Present (10/03/2023)    [employer] of Occupational Health - Occupational Stress Questionnaire    Feeling of Stress: Not at all  Social Connections: Moderately Integrated (10/03/2023)   Social Connection and Isolation Panel    Frequency of Communication with Friends and Family: More than three times a week    Frequency of Social Gatherings with Friends and Family: More than three times a week    Attends Religious Services: More than 4 times per year    Active Member of [employer] or Organizations: No    Attends Engineer, structural: Never    Marital Status: Married    Tobacco Counseling Counseling given: Yes Tobacco comments: about 3 to 4 cigarettes a day - finally quit completely    Clinical Intake:  Pre-visit preparation completed: Yes  Pain : No/denies pain     BMI - recorded: 28.08 Nutritional Status: BMI 25 -29 Overweight Nutritional Risks: None Diabetes: Yes CBG done?: No (  per pt 110 this morning)  Lab Results  Component Value Date   HGBA1C 6.9 (H) 08/31/2023   HGBA1C 7.2 (H) 12/26/2022   HGBA1C 9.9 (H) 10/17/2022     How often do you need to have someone help you when you read instructions, pamphlets, or other written materials from your doctor or pharmacy?: 1 - Never  Interpreter Needed?: No  Information entered by :: alia t/cma   Activities of Daily Living     10/03/2023   10:05 AM  In your present state of health, do you have any difficulty performing the following activities:  Hearing? 0  Vision? 0  Difficulty concentrating or making decisions? 0  Walking or climbing stairs? 0  Dressing or bathing? 0  Doing errands, shopping? 0  Preparing Food and eating ? N  Using the Toilet? N  In the past six months, have you accidently leaked urine? N  Do you have problems with loss of bowel control? N  Managing your Medications? N  Managing your Finances? N  Housekeeping or managing your Housekeeping? N    Patient Care Team: Lavell Bari LABOR, FNP as PCP - General  (Nurse Practitioner) Webster Rogue, MD as Consulting Physician [place], Mliss BIRCH, [facility] as Pharmacist (Family Medicine) Bertrum Rosina HERO, RN as [facility] Care Management (General Practice) Bertrum Rosina HERO, RN  I have updated your Care Teams any recent Medical Services you may have received from other providers in the past year.     Assessment:   This is a routine wellness examination for Alann.  Hearing/Vision screen Hearing Screening - Comments:: Pt denies hearing dif Vision Screening - Comments:: Pt wear glasses/pt goes to [facility] [address] in [place], [place]/last ov 2025   Goals Addressed             This Visit's Progress    Patient Stated       Pt would like to travel a little more       Depression Screen     10/03/2023   10:09 AM 08/31/2023    9:12 AM 08/16/2023   10:58 AM 07/19/2023   10:49 AM 06/19/2023   11:54 AM 01/12/2023    1:58 PM 10/17/2022    9:32 AM  PHQ 2/9 Scores  PHQ - 2 Score 0 0 0 0 0 0 0  PHQ- 9 Score 0 0         Fall Risk     10/03/2023   10:03 AM 08/31/2023    9:12 AM 08/16/2023   10:56 AM 07/19/2023   10:49 AM 06/19/2023   11:54 AM  Fall Risk   Falls in the past year? 0 0 0 1 0  Comment   Patient reports was choking due to GERD - did not fall but went down to ground    Number falls in past yr: 0 0 0 0 0  Injury with Fall? 0 0 0 1 0  Risk for fall due to : No Fall Risks History of fall(s) History of fall(s) History of fall(s);Other (Comment) No Fall Risks  Risk for fall due to: Comment    patient reports when coughing he becomes weak and had a fall on 07-18-23   Follow up Falls evaluation completed Falls evaluation completed;Education provided Falls evaluation completed  Falls evaluation completed;Education provided    MEDICARE RISK AT HOME:  Medicare Risk at Home Any stairs in or around the home?: No If so, are there any without handrails?: No Home free of loose throw rugs in  walkways, pet beds, electrical cords, etc?: Yes Adequate lighting in your home to  reduce risk of falls?: Yes Life alert?: Yes Use of a cane, walker or w/c?: No Grab bars in the bathroom?: No Shower chair or bench in shower?: No Elevated toilet seat or a handicapped toilet?: No  TIMED UP AND GO:  Was the test performed?  no  Cognitive Function: 6CIT completed    05/23/2017    1:41 PM 05/23/2016    3:21 PM  MMSE - Mini Mental State Exam  Orientation to time 5 4   Orientation to Place 5 5   Registration 3 3   Attention/ Calculation 5 5   Recall 3 3   Language- name 2 objects 2 2   Language- repeat 1 1  Language- follow 3 step command 3 3   Language- read & follow direction 1 1   Write a sentence 1 1   Copy design 1 1   Total score 30 29      Data saved with a previous flowsheet row definition        10/03/2023   10:10 AM 10/02/2022   11:20 AM 09/28/2021   11:33 AM 09/21/2020    1:39 PM  6CIT Screen  What Year? 0 points 0 points 0 points 0 points  What month? 0 points 0 points 0 points 0 points  What time? 0 points 0 points 0 points 0 points  Count back from 20 0 points 0 points 0 points 0 points  Months in reverse 0 points 0 points 0 points 0 points  Repeat phrase 0 points 0 points 0 points 0 points  Total Score 0 points 0 points 0 points 0 points    Immunizations Immunization History  Administered Date(s) Administered   Fluad Quad(high Dose 65+) 12/06/2018, 12/16/2021   Influenza Inj Mdck Quad Pf 12/22/2016   Influenza,inj,Quad PF,6+ Mos 12/30/2014, 01/18/2018   Influenza-Unspecified 12/22/2016   Moderna Sars-Covid-2 Vaccination 05/21/2019, 06/18/2019   Pneumococcal Conjugate-13 09/28/2014   Pneumococcal Polysaccharide-23 12/08/2020   Tdap 09/28/2014   Zoster Recombinant(Shingrix ) 12/08/2020, 10/13/2021    Screening Tests Health Maintenance  Topic Date Due   Diabetic kidney evaluation - Urine ACR  10/17/2023   Colonoscopy  12/26/2023 (Originally 03/11/2023)   INFLUENZA VACCINE  10/05/2023   FOOT EXAM  10/17/2023   OPHTHALMOLOGY EXAM   01/30/2024   HEMOGLOBIN A1C  03/01/2024   Lung Cancer Screening  05/24/2024   Diabetic kidney evaluation - eGFR measurement  08/30/2024   DTaP/Tdap/Td (2 - Td or Tdap) 09/27/2024   Medicare Annual Wellness (AWV)  10/02/2024   Pneumococcal Vaccine: 50+ Years  Completed   Hepatitis C Screening  Completed   Zoster Vaccines- Shingrix   Completed   Hepatitis B Vaccines  Aged Out   HPV VACCINES  Aged Out   Meningococcal B Vaccine  Aged Out   COVID-19 Vaccine  Discontinued    Health Maintenance  Health Maintenance Due  Topic Date Due   Diabetic kidney evaluation - Urine ACR  10/17/2023   Health Maintenance Items Addressed: See Nurse Notes at the end of this note  Additional Screening:  Vision Screening: Recommended annual ophthalmology exams for early detection of glaucoma and other disorders of the eye. Would you like a referral to an eye doctor? No    Dental Screening: Recommended annual dental exams for proper oral hygiene  Community Resource Referral / Chronic Care Management: CRR required this visit?  No   CCM required this visit?  No  Plan:    I have personally reviewed and noted the following in the patient's chart:   Medical and social history Use of alcohol, tobacco or illicit drugs  Current medications and supplements including opioid prescriptions. Patient is not currently taking opioid prescriptions. Functional ability and status Nutritional status Physical activity Advanced directives List of other physicians Hospitalizations, surgeries, and ER visits in previous 12 months Vitals Screenings to include cognitive, depression, and falls Referrals and appointments  In addition, I have reviewed and discussed with patient certain preventive protocols, quality metrics, and best practice recommendations. A written personalized care plan for preventive services as well as general preventive health recommendations were provided to patient.   Ozie Ned,  CMA   10/03/2023   After Visit Summary: (MyChart) Due to this being a telephonic visit, the after visit summary with patients personalized plan was offered to patient via MyChart   Notes: Nothing significant to report at this time.

## 2023-10-03 NOTE — Progress Notes (Signed)
 No show

## 2023-10-09 ENCOUNTER — Ambulatory Visit (INDEPENDENT_AMBULATORY_CARE_PROVIDER_SITE_OTHER): Admitting: Family Medicine

## 2023-10-09 ENCOUNTER — Encounter: Payer: Self-pay | Admitting: Family Medicine

## 2023-10-09 VITALS — BP 105/67 | HR 109 | Temp 97.5°F | Ht 66.0 in | Wt 165.0 lb

## 2023-10-09 DIAGNOSIS — R051 Acute cough: Secondary | ICD-10-CM | POA: Diagnosis not present

## 2023-10-09 DIAGNOSIS — B9689 Other specified bacterial agents as the cause of diseases classified elsewhere: Secondary | ICD-10-CM | POA: Diagnosis not present

## 2023-10-09 DIAGNOSIS — J208 Acute bronchitis due to other specified organisms: Secondary | ICD-10-CM | POA: Diagnosis not present

## 2023-10-09 MED ORDER — AZITHROMYCIN 250 MG PO TABS
ORAL_TABLET | ORAL | 0 refills | Status: DC
Start: 2023-10-09 — End: 2023-10-17

## 2023-10-09 MED ORDER — BENZONATATE 200 MG PO CAPS: 200.0000 mg | ORAL_CAPSULE | Freq: Three times a day (TID) | ORAL | 0 refills | Status: DC | PRN

## 2023-10-09 NOTE — Progress Notes (Signed)
 Subjective:  Patient ID: Ryan Mcintyre, male    DOB: 1955/11/20  Age: 68 y.o. MRN: [id number]  CC: Fatigue, Fever, and Cough   HPI Ryan Mcintyre presents for weak, in bed three days due to malaise, cugh, weakness. Dry cough. Denies dyspnea. Smoker until 1 year ago. Glucose staying around 120 during illness.      10/09/2023    9:48 AM 10/03/2023   10:09 AM 08/31/2023    9:12 AM  Depression screen PHQ 2/9  Decreased Interest 0 0 0  Down, Depressed, Hopeless 0 0 0  PHQ - 2 Score 0 0 0  Altered sleeping  0 0  Tired, decreased energy  0 0  Change in appetite  0 0  Feeling bad or failure about yourself   0 0  Trouble concentrating  0 0  Moving slowly or fidgety/restless  0 0  Suicidal thoughts  0 0  PHQ-9 Score  0 0  Difficult doing work/chores  Not difficult at all Not difficult at all    History [name] has a past medical history of COPD (chronic obstructive pulmonary disease) (HCC) (02/21/2017), Diabetes mellitus without complication (HCC), GERD (gastroesophageal reflux disease), and Hyperlipidemia.   He has a past surgical history that includes Circumcision (1974).   His family history includes Diabetes in his mother and sister; Early death in his brother, brother, and sister; Hypertension in his mother.He reports that he quit smoking about 3 years ago. His smoking use included cigarettes. He started smoking about 53 years ago. He has a 12.5 pack-year smoking history. He has never used smokeless tobacco. He reports that he does not drink alcohol and does not use drugs.    ROS Review of Systems  Constitutional:  Negative for activity change, appetite change, chills and fever.  HENT:  Positive for congestion, rhinorrhea and sinus pressure. Negative for ear discharge, ear pain, hearing loss, nosebleeds, postnasal drip, sneezing and trouble swallowing.   Respiratory:  Negative for chest tightness and shortness of breath.   Cardiovascular:  Negative for chest pain and palpitations.   Skin:  Negative for rash.    Objective:  BP 105/67   Pulse (!) 109   Temp (!) 97.5 F (36.4 C)   Ht 5' 6 (1.676 m)   Wt 165 lb (74.8 kg)   SpO2 92%   BMI 26.63 kg/m   BP Readings from Last 3 Encounters:  10/09/23 105/67  10/03/23 137/75  08/31/23 137/75    Wt Readings from Last 3 Encounters:  10/09/23 165 lb (74.8 kg)  10/03/23 174 lb (78.9 kg)  08/31/23 174 lb 3.2 oz (79 kg)     Physical Exam Constitutional:      Appearance: He is well-developed.  HENT:     Head: Normocephalic and atraumatic.     Right Ear: Tympanic membrane and external ear normal. No decreased hearing noted.     Left Ear: Tympanic membrane and external ear normal. No decreased hearing noted.     Nose: Mucosal edema present.     Right Sinus: No frontal sinus tenderness.     Left Sinus: No frontal sinus tenderness.     Mouth/Throat:     Pharynx: No oropharyngeal exudate or posterior oropharyngeal erythema.  Neck:     Meningeal: Brudzinski's sign absent.  Pulmonary:     Effort: No respiratory distress.     Breath sounds: Normal breath sounds.  Lymphadenopathy:     Head:     Right side of head: No preauricular  adenopathy.     Left side of head: No preauricular adenopathy.     Cervical:     Right cervical: No superficial cervical adenopathy.    Left cervical: No superficial cervical adenopathy.      Assessment & Plan:  Acute bacterial bronchitis  Acute cough -     COVID-19, Flu A+B and RSV  Other orders -     Benzonatate ; Take 1 capsule (200 mg total) by mouth 3 (three) times daily as needed for cough.  Dispense: 20 capsule; Refill: 0 -     Azithromycin ; Take two right away Then one a day for the next 4 days.  Dispense: 6 each; Refill: 0     Follow-up: Return if symptoms worsen or fail to improve.  Butler Der, M.D.

## 2023-10-10 LAB — COVID-19, FLU A+B AND RSV
Influenza A, NAA: NOT DETECTED
Influenza B, NAA: NOT DETECTED
RSV, NAA: NOT DETECTED
SARS-CoV-2, NAA: NOT DETECTED

## 2023-10-11 ENCOUNTER — Emergency Department (HOSPITAL_COMMUNITY)
Admission: EM | Admit: 2023-10-11 | Discharge: 2023-10-11 | Disposition: A | Discharge status: Home / Self Care | Attending: Emergency Medicine | Admitting: Emergency Medicine

## 2023-10-11 ENCOUNTER — Ambulatory Visit: Payer: Self-pay | Admitting: Family

## 2023-10-11 ENCOUNTER — Ambulatory Visit: Payer: Self-pay

## 2023-10-11 ENCOUNTER — Ambulatory Visit (INDEPENDENT_AMBULATORY_CARE_PROVIDER_SITE_OTHER)

## 2023-10-11 ENCOUNTER — Telehealth: Payer: Self-pay

## 2023-10-11 ENCOUNTER — Emergency Department (HOSPITAL_COMMUNITY)

## 2023-10-11 ENCOUNTER — Other Ambulatory Visit: Payer: Self-pay

## 2023-10-11 ENCOUNTER — Encounter: Payer: Self-pay | Admitting: Family

## 2023-10-11 ENCOUNTER — Ambulatory Visit (INDEPENDENT_AMBULATORY_CARE_PROVIDER_SITE_OTHER): Admitting: Family

## 2023-10-11 ENCOUNTER — Encounter (HOSPITAL_COMMUNITY): Payer: Self-pay

## 2023-10-11 ENCOUNTER — Ambulatory Visit: Payer: Self-pay | Admitting: Family Medicine

## 2023-10-11 ENCOUNTER — Other Ambulatory Visit: Payer: Self-pay | Admitting: Family

## 2023-10-11 VITALS — BP 111/67 | HR 111 | Temp 98.6°F | Ht 66.0 in | Wt 162.0 lb

## 2023-10-11 DIAGNOSIS — R059 Cough, unspecified: Secondary | ICD-10-CM | POA: Diagnosis not present

## 2023-10-11 DIAGNOSIS — F17210 Nicotine dependence, cigarettes, uncomplicated: Secondary | ICD-10-CM | POA: Insufficient documentation

## 2023-10-11 DIAGNOSIS — J9601 Acute respiratory failure with hypoxia: Secondary | ICD-10-CM | POA: Diagnosis not present

## 2023-10-11 DIAGNOSIS — E119 Type 2 diabetes mellitus without complications: Secondary | ICD-10-CM

## 2023-10-11 DIAGNOSIS — Z7982 Long term (current) use of aspirin: Secondary | ICD-10-CM | POA: Diagnosis not present

## 2023-10-11 DIAGNOSIS — R0602 Shortness of breath: Secondary | ICD-10-CM | POA: Diagnosis present

## 2023-10-11 DIAGNOSIS — J441 Chronic obstructive pulmonary disease with (acute) exacerbation: Secondary | ICD-10-CM

## 2023-10-11 DIAGNOSIS — R269 Unspecified abnormalities of gait and mobility: Secondary | ICD-10-CM

## 2023-10-11 DIAGNOSIS — R531 Weakness: Secondary | ICD-10-CM | POA: Diagnosis not present

## 2023-10-11 DIAGNOSIS — R051 Acute cough: Secondary | ICD-10-CM

## 2023-10-11 LAB — CBC WITH DIFFERENTIAL/PLATELET
Abs Immature Granulocytes: 0.01 K/uL (ref 0.00–0.07)
Basophils Absolute: 0 K/uL (ref 0.0–0.1)
Basophils Relative: 1 %
Eosinophils Absolute: 0.1 K/uL (ref 0.0–0.5)
Eosinophils Relative: 1 %
HCT: 54.1 % — ABNORMAL HIGH (ref 39.0–52.0)
Hemoglobin: 18 g/dL — ABNORMAL HIGH (ref 13.0–17.0)
Immature Granulocytes: 0 %
Lymphocytes Relative: 18 %
Lymphs Abs: 1 K/uL (ref 0.7–4.0)
MCH: 30.3 pg (ref 26.0–34.0)
MCHC: 33.3 g/dL (ref 30.0–36.0)
MCV: 91.1 fL (ref 80.0–100.0)
Monocytes Absolute: 0.8 K/uL (ref 0.1–1.0)
Monocytes Relative: 14 %
Neutro Abs: 3.6 K/uL (ref 1.7–7.7)
Neutrophils Relative %: 66 %
Platelets: 176 K/uL (ref 150–400)
RBC: 5.94 MIL/uL — ABNORMAL HIGH (ref 4.22–5.81)
RDW: 13.2 % (ref 11.5–15.5)
WBC: 5.5 K/uL (ref 4.0–10.5)
nRBC: 0 % (ref 0.0–0.2)

## 2023-10-11 LAB — COMPREHENSIVE METABOLIC PANEL WITH GFR
ALT: 34 U/L (ref 0–44)
AST: 27 U/L (ref 15–41)
Albumin: 4.1 g/dL (ref 3.5–5.0)
Alkaline Phosphatase: 63 U/L (ref 38–126)
Anion gap: 15 (ref 5–15)
BUN: 33 mg/dL — ABNORMAL HIGH (ref 8–23)
CO2: 25 mmol/L (ref 22–32)
Calcium: 9.1 mg/dL (ref 8.9–10.3)
Chloride: 99 mmol/L (ref 98–111)
Creatinine, Ser: 1.22 mg/dL (ref 0.61–1.24)
GFR, Estimated: >60 mL/min (ref >60)
Glucose, Bld: 140 mg/dL — ABNORMAL HIGH (ref 70–99)
Potassium: 4.1 mmol/L (ref 3.5–5.1)
Sodium: 139 mmol/L (ref 135–145)
Total Bilirubin: 1.2 mg/dL (ref 0.0–1.2)
Total Protein: 8 g/dL (ref 6.5–8.1)

## 2023-10-11 LAB — BLOOD GAS, VENOUS
Acid-Base Excess: 2.9 mmol/L — ABNORMAL HIGH (ref 0.0–2.0)
Bicarbonate: 31.1 mmol/L — ABNORMAL HIGH (ref 20.0–28.0)
Drawn by: 7012
O2 Saturation: 72.4 %
Patient temperature: 37.1
pCO2, Ven: 59 mmHg (ref 44–60)
pH, Ven: 7.33 (ref 7.25–7.43)
pO2, Ven: 44 mmHg (ref 32–45)

## 2023-10-11 LAB — RESP PANEL BY RT-PCR (RSV, FLU A&B, COVID)  RVPGX2
Influenza A by PCR: NEGATIVE
Influenza B by PCR: NEGATIVE
Resp Syncytial Virus by PCR: NEGATIVE
SARS Coronavirus 2 by RT PCR: NEGATIVE

## 2023-10-11 LAB — PROTIME-INR
INR: 1 (ref 0.8–1.2)
Prothrombin Time: 13.5 s (ref 11.4–15.2)

## 2023-10-11 LAB — LACTIC ACID, PLASMA: Lactic Acid, Venous: 1 mmol/L (ref 0.5–1.9)

## 2023-10-11 MED ORDER — SODIUM CHLORIDE 0.9 % IV SOLN
500.0000 mg | Freq: Once | INTRAVENOUS | Status: AC
  Administered 2023-10-11: 500 mg via INTRAVENOUS
  Filled 2023-10-11: qty 5

## 2023-10-11 MED ORDER — ALBUTEROL SULFATE (2.5 MG/3ML) 0.083% IN NEBU
10.0000 mg | INHALATION_SOLUTION | RESPIRATORY_TRACT | Status: AC
  Administered 2023-10-11: 10 mg via RESPIRATORY_TRACT
  Filled 2023-10-11: qty 12

## 2023-10-11 MED ORDER — SODIUM CHLORIDE 0.9 % IV SOLN
2.0000 g | Freq: Once | INTRAVENOUS | Status: AC
  Administered 2023-10-11: 2 g via INTRAVENOUS
  Filled 2023-10-11: qty 20

## 2023-10-11 MED ORDER — AEROCHAMBER PLUS FLO-VU MEDIUM MISC
1.0000 | Freq: Once | Status: AC
  Administered 2023-10-11: 1

## 2023-10-11 MED ORDER — LACTATED RINGERS IV SOLN: INTRAVENOUS | Status: DC

## 2023-10-11 MED ORDER — ALBUTEROL SULFATE HFA 108 (90 BASE) MCG/ACT IN AERS: 2.0000 | INHALATION_SPRAY | RESPIRATORY_TRACT | 3 refills | Status: AC | PRN

## 2023-10-11 MED ORDER — ALBUTEROL SULFATE HFA 108 (90 BASE) MCG/ACT IN AERS
2.0000 | INHALATION_SPRAY | Freq: Once | RESPIRATORY_TRACT | Status: AC
  Administered 2023-10-11: 2 via RESPIRATORY_TRACT
  Filled 2023-10-11: qty 6.7

## 2023-10-11 MED ORDER — METFORMIN HCL 1000 MG PO TABS: 1000.0000 mg | ORAL_TABLET | Freq: Two times a day (BID) | ORAL | 0 refills | Status: DC

## 2023-10-11 MED ORDER — PREDNISONE 20 MG PO TABS: 40.0000 mg | ORAL_TABLET | Freq: Every day | ORAL | 0 refills | Status: DC

## 2023-10-11 MED ORDER — IPRATROPIUM-ALBUTEROL 0.5-2.5 (3) MG/3ML IN SOLN
3.0000 mL | Freq: Once | RESPIRATORY_TRACT | Status: AC
  Administered 2023-10-11: 3 mL via RESPIRATORY_TRACT
  Filled 2023-10-11: qty 3

## 2023-10-11 MED ORDER — METHYLPREDNISOLONE SODIUM SUCC 125 MG IJ SOLR
125.0000 mg | Freq: Once | INTRAMUSCULAR | Status: AC
  Administered 2023-10-11: 125 mg via INTRAVENOUS
  Filled 2023-10-11: qty 2

## 2023-10-11 NOTE — ED Notes (Signed)
 Pt reports recent Xray was performed as well as Resp Panel, and Pt did not receive any concerning results.

## 2023-10-11 NOTE — Telephone Encounter (Signed)
 Apt scheduled.

## 2023-10-11 NOTE — Telephone Encounter (Signed)
 Copied from CRM [id number]. Topic: Clinical - Medication Refill >> Oct 11, 2023  9:48 AM Emylou G wrote: Medication: metFORMIN  (GLUCOPHAGE ) 1000 MG tablet  Has the patient contacted their pharmacy? No (Agent: If no, request that the patient contact the pharmacy for the refill. If patient does not wish to contact the pharmacy document the reason why and proceed with request.) (Agent: If yes, when and what did the pharmacy advise?)  This is the patient's preferred pharmacy:  [id number] - [place], [place] - [address] [place] [address] Phone: [phone] Fax: [phone]   Is this the correct pharmacy for this prescription? Yes If no, delete pharmacy and type the correct one.   Has the prescription been filled recently? no  Is the patient out of the medication? Yes  Has the patient been seen for an appointment in the last year OR does the patient have an upcoming appointment? Yes  Can we respond through MyChart? No  Agent: Please be advised that Rx refills may take up to 3 business days. We ask that you follow-up with your pharmacy.

## 2023-10-11 NOTE — Telephone Encounter (Signed)
 FYI Only or Action Required?: FYI only for provider.  Patient was last seen in primary care on 10/09/2023 by Zollie Lowers, MD.  Called Nurse Triage reporting Cough.  Symptoms began several days ago.  Interventions attempted: Prescription medications: abx.  Symptoms are: gradually worsening.  Triage Disposition: See HCP Within 4 Hours (Or PCP Triage)  Patient/caregiver understands and will follow disposition?: Yes, will follow disposition  Copied from CRM #[id number]. Topic: Clinical - Red Word Triage >> Oct 11, 2023  8:35 AM Ryan Mcintyre wrote: Red Word that prompted transfer to Nurse Triage: pt stated he has trouble breathing when he's laying down. He stated he is not able to keep anything down. He throws up his food and antibiotics and not able to take his sugar pills. Reason for Disposition  [1] MILD difficulty breathing (e.g., minimal/no SOB at rest, SOB with walking, pulse < 100) AND [2] still present when not coughing  Answer Assessment - Initial Assessment Questions 1. ONSET: When did the cough begin?      5 days ago 2. SEVERITY: How bad is the cough today?      Pretty bad 3. SPUTUM: Describe the color of your sputum (e.g., none, dry cough; clear, white, yellow, green)     Denies sputum 4. HEMOPTYSIS: Are you coughing up any blood? If Yes, ask: How much? (e.g., flecks, streaks, tablespoons, etc.)     denies 5. DIFFICULTY BREATHING: Are you having difficulty breathing? If Yes, ask: How bad is it? (e.g., mild, moderate, severe)      Mild SOB 6. FEVER: Do you have a fever? If Yes, ask: What is your temperature, how was it measured, and when did it start?     100.2 yesterday, states that he has not checked his temp today 7. CARDIAC HISTORY: Do you have any history of heart disease? (e.g., heart attack, congestive heart failure)      Denies  8. LUNG HISTORY: Do you have any history of lung disease?  (e.g., pulmonary embolus, asthma, emphysema)     denies 9. PE  RISK FACTORS: Do you have a history of blood clots? (or: recent major surgery, recent prolonged travel, bedridden)     denies 10. OTHER SYMPTOMS: Do you have any other symptoms? (e.g., runny nose, wheezing, chest pain)       Nose congestion, denies other s/s  Protocols used: Cough - Acute Non-Productive-A-AH

## 2023-10-11 NOTE — ED Provider Notes (Signed)
 [facility] AT [facility] Provider Note   CSN: [id number] Arrival date & time: 10/11/23  1458     Patient presents with: Shortness of Breath   Ryan Mcintyre is a 68 y.o. male.    Shortness of Breath  This patient is a 68 year old male with a longtime history of tobacco use, diagnosed with COPD but not on any breathing medications.  He is on metformin , lisinopril , Farxiga  and atorvastatin  chronically.  He has an autistic grandson in the house who was sent home from school a week ago with a fever who has been in close proximity.  The patient had a temperature of 100.5 at home this morning and has had increasing cough and shortness of breath.  On arrival the patient's oxygen was 85% and was placed on oxygen in the triage room.  He denies any diarrhea but states he has been vomiting when he tries to eat.  The patient reports that he was recently given a Z-Pak by his primary care doctor but has not improved.  He was seen in the office today at [facility], he was advised to come to the hospital because of hypoxia.    Prior to Admission medications   Medication Sig Start Date End Date Taking? Authorizing Provider  albuterol  (VENTOLIN  HFA) 108 (90 Base) MCG/ACT inhaler Inhale 2 puffs into the lungs every 4 (four) hours as needed for wheezing or shortness of breath. 10/11/23  Yes Cleotilde Rogue, MD  aspirin  EC (EQ ASPIRIN  ADULT LOW DOSE) 81 MG tablet TAKE 1 TABLET BY MOUTH ONCE DAILY.  SWALLOW WHOLE 10/03/23  Yes Hawks, Christy A, FNP  atorvastatin  (LIPITOR) 40 MG tablet Take 1 tablet (40 mg total) by mouth daily. 08/17/23  Yes Hawks, Christy A, FNP  azithromycin  (ZITHROMAX  Z-PAK) 250 MG tablet Take two right away Then one a day for the next 4 days. Patient taking differently: Take 250 mg by mouth daily. Take two right away Then one a day for the next 4 days. 10/09/23  Yes Zollie Lowers, MD  dapagliflozin  propanediol (FARXIGA ) 10 MG TABS tablet  Take 1 tablet (10 mg total) by mouth daily before breakfast. 09/18/23  Yes Hawks, Christy A, FNP  lisinopril  (ZESTRIL ) 20 MG tablet Take 1 tablet (20 mg total) by mouth daily. 12/26/22  Yes Hawks, Christy A, FNP  metFORMIN  (GLUCOPHAGE ) 1000 MG tablet Take 1 tablet (1,000 mg total) by mouth 2 (two) times daily with a meal. 10/11/23  Yes Hawks, Christy A, FNP  omeprazole  (PRILOSEC) 40 MG capsule Take 1 capsule (40 mg total) by mouth daily. 07/24/23  Yes Hawks, Christy A, FNP  predniSONE  (DELTASONE ) 20 MG tablet Take 2 tablets (40 mg total) by mouth daily. 10/11/23  Yes Cleotilde Rogue, MD  sildenafil  (VIAGRA ) 100 MG tablet Take 0.5-1 tablets (50-100 mg total) by mouth daily as needed for erectile dysfunction. 10/13/21  Yes Hawks, Christy A, FNP  benzonatate  (TESSALON ) 200 MG capsule Take 1 capsule (200 mg total) by mouth 3 (three) times daily as needed for cough. Patient not taking: Reported on 10/11/2023 10/09/23   Zollie Lowers, MD  blood glucose meter kit and supplies Dispense based on patient and insurance preference. Use up to four times daily as directed. (FOR ICD-10 E10.9, E11.9). 10/13/21   Lavell Lye A, FNP  Continuous Glucose Sensor (FREESTYLE LIBRE 3 PLUS SENSOR) MISC USE AS DIRECTED CHANGE EVERY 15 DAYS TO MONITOR BLOOD GLUCOSE CONTINUOUSLY 03/12/23   [provider]  glucose blood test  strip Test daily. 08/30/16   Lavell Lye A, FNP    Allergies: Ozempic  (0.25 or 0.5 mg-dose) [semaglutide (0.25 or 0.5mg -dos)]    Review of Systems  Respiratory:  Positive for shortness of breath.   All other systems reviewed and are negative.   Updated Vital Signs BP 115/70 (BP Location: Right Arm)   Pulse (!) 107   Temp 98.7 F (37.1 C) (Oral)   Resp (!) 22   Ht 1.676 m (5' 6)   Wt 73.4 kg   SpO2 97%   BMI 26.12 kg/m   Physical Exam Vitals and nursing note reviewed.  Constitutional:      General: He is in acute distress.     Appearance: He is well-developed.  HENT:     Head:  Normocephalic and atraumatic.     Mouth/Throat:     Pharynx: No oropharyngeal exudate.  Eyes:     General: No scleral icterus.       Right eye: No discharge.        Left eye: No discharge.     Conjunctiva/sclera: Conjunctivae normal.     Pupils: Pupils are equal, round, and reactive to light.  Neck:     Thyroid : No thyromegaly.     Vascular: No JVD.  Cardiovascular:     Rate and Rhythm: Regular rhythm. Tachycardia present.     Heart sounds: Normal heart sounds. No murmur heard.    No friction rub. No gallop.  Pulmonary:     Effort: Respiratory distress present.     Breath sounds: Wheezing present. No rales.     Comments: Decreased air movement in all lung fields, expiratory wheezing in all lung fields, no rales, mild tachypnea, hypoxia Abdominal:     General: Bowel sounds are normal. There is no distension.     Palpations: Abdomen is soft. There is no mass.     Tenderness: There is no abdominal tenderness.  Musculoskeletal:        General: No tenderness. Normal range of motion.     Cervical back: Normal range of motion and neck supple.     Right lower leg: No edema.     Left lower leg: No edema.  Lymphadenopathy:     Cervical: No cervical adenopathy.  Skin:    General: Skin is warm and dry.     Findings: No erythema or rash.  Neurological:     General: No focal deficit present.     Mental Status: He is alert.     Coordination: Coordination normal.  Psychiatric:        Behavior: Behavior normal.     (all labs ordered are listed, but only abnormal results are displayed) Labs Reviewed  CBC WITH DIFFERENTIAL/PLATELET - Abnormal; Notable for the following components:      Result Value   RBC 5.94 (*)    Hemoglobin 18.0 (*)    HCT 54.1 (*)    All other components within normal limits  COMPREHENSIVE METABOLIC PANEL WITH GFR - Abnormal; Notable for the following components:   Glucose, Bld 140 (*)    BUN 33 (*)    All other components within normal limits  BLOOD GAS,  VENOUS - Abnormal; Notable for the following components:   Bicarbonate 31.1 (*)    Acid-Base Excess 2.9 (*)    All other components within normal limits  RESP PANEL BY RT-PCR (RSV, FLU A&B, COVID)  RVPGX2  CULTURE, BLOOD (ROUTINE X 2)  CULTURE, BLOOD (ROUTINE X 2)  LACTIC ACID, PLASMA  PROTIME-INR  LACTIC ACID, PLASMA  URINALYSIS, W/ REFLEX TO CULTURE (INFECTION SUSPECTED)    EKG: EKG Interpretation Date/Time:  Thursday October 11 2023 15:20:21 EDT Ventricular Rate:  100 PR Interval:  146 QRS Duration:  72 QT Interval:  342 QTC Calculation: 441 R Axis:   60  Text Interpretation: Normal sinus rhythm Normal ECG No previous ECGs available Confirmed by Cleotilde Rogue ([id number]) on 10/11/2023 3:22:54 PM  Radiology: DG Chest 2 View Result Date: 10/11/2023 CLINICAL DATA:  Cough. EXAM: CHEST - 2 VIEW COMPARISON:  May 25, 2023. FINDINGS: The heart size and mediastinal contours are within normal limits. Both lungs are clear. The visualized skeletal structures are unremarkable. IMPRESSION: No active cardiopulmonary disease. Electronically Signed   By: Lynwood Landy Raddle M.D.   On: 10/11/2023 13:09     Procedures   Medications Ordered in the ED  lactated ringers  infusion ( Intravenous New Bag/Given 10/11/23 1549)  albuterol  (PROVENTIL ) (2.5 MG/3ML) 0.083% nebulizer solution 10 mg (10 mg Nebulization New Bag/Given 10/11/23 1557)  albuterol  (VENTOLIN  HFA) 108 (90 Base) MCG/ACT inhaler 2 puff (has no administration in time range)  AeroChamber Plus Flo-Vu Medium MISC 1 each (has no administration in time range)  cefTRIAXone  (ROCEPHIN ) 2 g in sodium chloride  0.9 % 100 mL IVPB (0 g Intravenous Stopped 10/11/23 1640)  azithromycin  (ZITHROMAX ) 500 mg in sodium chloride  0.9 % 250 mL IVPB (0 mg Intravenous Stopped 10/11/23 1750)  ipratropium-albuterol  (DUONEB) 0.5-2.5 (3) MG/3ML nebulizer solution 3 mL (3 mLs Nebulization Given 10/11/23 1557)  methylPREDNISolone  sodium succinate (SOLU-MEDROL ) 125 mg/2 mL injection  125 mg (125 mg Intravenous Given 10/11/23 1750)                                    Medical Decision Making Amount and/or Complexity of Data Reviewed Labs: ordered. Radiology: ordered.  Risk Prescription drug management.   This patient is already had a two-view chest x-ray today which did not show any specific infiltrates.   This patient presents to the ED for concern of shortness of breath and increased cough, this involves an extensive number of treatment options, and is a complaint that carries with it a high risk of complications and morbidity.  The differential diagnosis includes COPD, pneumonia, viral, respiratory failure   Co morbidities / Chronic conditions that complicate the patient evaluation  Chronic COPD smokes 1 pack/day   Additional history obtained:  Additional history obtained from EMR External records from outside source obtained and reviewed including medical record including office visit from today Diagnosed with COVID-19 May 29, 2022, no other recent admissions to the hospital over the last few years   Lab Tests:  I Ordered, and personally interpreted labs.  The pertinent results include: COVID and flu negative, CBC without leukocytosis, metabolic panel without Kindig failure, electrolytes unremarkable, lactate 1.0 and VBG without acidosis.   Imaging Studies ordered:  I ordered imaging studies including chest x-ray performed prior to arrival, 2 view I independently visualized and interpreted imaging which showed no acute infiltrates I agree with the radiologist interpretation   Cardiac Monitoring: / EKG:  The patient was maintained on a cardiac monitor.  I personally viewed and interpreted the cardiac monitored which showed an underlying rhythm of: Normal sinus rhythm, no tachycardia at the time of discharge, heart rate of 100 after getting nebulizer therapy   Problem List / ED Course / Critical interventions / Medication management  The patient  was given coverage for possible community-acquired pneumonia as well as nebulized therapy and Solu-Medrol .  At the end of this despite having some hypoxia on arrival he was now able to ambulate with oxygen of 93% up to 97% while ambulating around the department on room air. I ordered medication including albuterol  continuous nebulizer, Solu-Medrol , Zithromax , Rocephin  Reevaluation of the patient after these medicines showed that the patient improved significantly and is requesting discharge I have reviewed the patients home medicines and have made adjustments as needed   Social Determinants of Health:  Tobacco use   Test / Admission - Considered:  Sitter at admission but the patient wants to go home and I think this is reasonable now that he is no longer hypoxic.  He will be given an albuterol  MDI prior to discharge with instructions on how to use it with a spacer.  He is agreeable to return should symptoms worsen      Final diagnoses:  COPD exacerbation [facility])    ED Discharge Orders          Ordered    albuterol  (VENTOLIN  HFA) 108 (90 Base) MCG/ACT inhaler  Every 4 hours PRN        10/11/23 1813    predniSONE  (DELTASONE ) 20 MG tablet  Daily        10/11/23 1813               Cleotilde Rogue, MD 10/11/23 1814

## 2023-10-11 NOTE — ED Triage Notes (Signed)
 Pt arrived via POV from home c/o persistent cough, SOB and mild fever. Pt reports recently being started on Z-Pack by his PCP w/o relief. Pt also reports multiple episodes of emesis and reports calling his PCP office today and being advised to go to the ED for further evaluation.

## 2023-10-11 NOTE — Discharge Instructions (Signed)
 Please finish the medications that your doctor prescribed today, the Zithromax .  I have added prednisone  once a day for 5 days and an albuterol  inhaler which you can use 2 puffs every 4 hours for the next 24 hours then 2 puffs every 4 hours as needed  If you develop severe or worsening symptoms return to the emergency department immediately otherwise see your doctor in 2 to 3 days for recheck

## 2023-10-11 NOTE — Progress Notes (Signed)
 Complex Care Management Care Guide Note  10/11/2023 Name: Ryan Mcintyre MRN: [id number] DOB: October 10, 1955  Ryan Mcintyre is a 68 y.o. year old male who is a primary care patient of Lavell Bari LABOR, FNP and is actively engaged with the care management team. I reached out to Wilhelmina JINNY Sieve by phone today to assist with re-scheduling  with the Pharmacist.  Follow up plan: Unsuccessful telephone outreach attempt made. A HIPAA compliant phone message was left for the patient providing contact information and requesting a return call.  Jeoffrey Buffalo , RMA     [facility]  [facility], [facility] Guide  Direct Dial : [phone]  Website: [email or web address]

## 2023-10-11 NOTE — ED Notes (Signed)
 Pt ambulated around ER and maintained O2 of 93 to 94 % while on room air.  Pt reports he feels better and does not feel short of breath.

## 2023-10-11 NOTE — ED Notes (Signed)
 During Triage, Pts O2 Sats observed to be 85% on room air. Pt placed on 4L Nasal Cannula and O2 Sats improved to 98%

## 2023-10-11 NOTE — Sepsis Progress Note (Signed)
 Sepsis protocol monitored by eLink ?

## 2023-10-11 NOTE — Progress Notes (Signed)
 Subjective:    Patient ID: Ryan Mcintyre, male    DOB: February 16, 1956, 68 y.o.   MRN: [id number]  Chief Complaint  Patient presents with   head congestion    Cough Can't keep food down X 6 days    Pt presents to the office today with worsening cough and congestion. Was seen in the office on 10/09/23 and had negative Flu, COVID, and RSV.  Was started on zpak and tessalon .   Pt states he feels worsen and can keep food down now.  Cough This is a new problem. The current episode started 1 to 4 weeks ago. The problem has been gradually worsening. The problem occurs every few minutes. The cough is Non-productive. Associated symptoms include chills, headaches, myalgias, nasal congestion, postnasal drip, shortness of breath and wheezing. Pertinent negatives include no ear congestion, ear pain, fever or sore throat. He has tried rest for the symptoms. The treatment provided mild relief. His past medical history is significant for COPD.      Review of Systems  Constitutional:  Positive for chills. Negative for fever.  HENT:  Positive for postnasal drip. Negative for ear pain and sore throat.   Respiratory:  Positive for cough, shortness of breath and wheezing.   Musculoskeletal:  Positive for myalgias.  Neurological:  Positive for headaches.  All other systems reviewed and are negative.   Social History   Socioeconomic History   Marital status: Married    Spouse name: Mary   Number of children: 3   Years of education: 12   Highest education level: 12th grade  Occupational History   Occupation: Designer, fashion/clothing    Comment: Disabled  Tobacco Use   Smoking status: Former    Current packs/day: 0.00    Average packs/day: 0.3 packs/day for 50.0 years (12.5 ttl pk-yrs)    Types: Cigarettes    Start date: 04/06/1970    Quit date: 04/06/2020    Years since quitting: 3.5   Smokeless tobacco: Never   Tobacco comments:    about 3 to 4 cigarettes a day - finally quit completely  Vaping Use   Vaping  status: Never Used  Substance and Sexual Activity   Alcohol use: No    Alcohol/week: 0.0 standard drinks of alcohol   Drug use: No   Sexual activity: Yes  Other Topics Concern   Not on file  Social History Narrative   Lives on one level home with wife   They enjoy going dancing every Thursday night   Social Drivers of Health   Financial Resource Strain: Low Risk  (10/03/2023)   Overall Financial Resource Strain (CARDIA)    Difficulty of Paying Living Expenses: Not hard at all  Food Insecurity: No Food Insecurity (10/03/2023)   Hunger Vital Sign    Worried About Running Out of Food in the Last Year: Never true    Ran Out of Food in the Last Year: Never true  Transportation Needs: No Transportation Needs (10/03/2023)   PRAPARE - Administrator, Civil Service (Medical): No    Lack of Transportation (Non-Medical): No  Physical Activity: Sufficiently Active (10/03/2023)   Exercise Vital Sign    Days of Exercise per Week: 7 days    Minutes of Exercise per Session: 30 min  Stress: No Stress Concern Present (10/03/2023)   [employer] of Occupational Health - Occupational Stress Questionnaire    Feeling of Stress: Not at all  Social Connections: Moderately Integrated (10/03/2023)   Social Connection and  Isolation Panel    Frequency of Communication with Friends and Family: More than three times a week    Frequency of Social Gatherings with Friends and Family: More than three times a week    Attends Religious Services: More than 4 times per year    Active Member of [employer] or Organizations: No    Attends Banker Meetings: Never    Marital Status: Married   Family History  Problem Relation Age of Onset   Hypertension Mother    Diabetes Mother    Diabetes Sister    Early death Sister    Early death Brother    Early death Brother         Objective:   Physical Exam Vitals reviewed.  Constitutional:      General: He is not in acute distress.     Appearance: He is well-developed.  HENT:     Head: Normocephalic.  Eyes:     General:        Right eye: No discharge.        Left eye: No discharge.     Pupils: Pupils are equal, round, and reactive to light.  Neck:     Thyroid : No thyromegaly.  Cardiovascular:     Rate and Rhythm: Normal rate and regular rhythm.     Heart sounds: Normal heart sounds. No murmur heard. Pulmonary:     Effort: Pulmonary effort is normal. No respiratory distress.     Breath sounds: Normal breath sounds. No wheezing.  Abdominal:     General: Bowel sounds are normal. There is no distension.     Palpations: Abdomen is soft.     Tenderness: There is no abdominal tenderness.  Musculoskeletal:        General: No tenderness. Normal range of motion.     Cervical back: Normal range of motion and neck supple.  Skin:    General: Skin is warm and dry.     Findings: No erythema or rash.  Neurological:     Mental Status: He is alert and oriented to person, place, and time.     Cranial Nerves: No cranial nerve deficit.     Motor: Weakness present.     Coordination: Coordination abnormal.     Gait: Gait abnormal.     Deep Tendon Reflexes: Reflexes are normal and symmetric.  Psychiatric:        Behavior: Behavior normal.        Thought Content: Thought content normal.        Judgment: Judgment normal.       BP 100/61   Pulse (!) 110   Temp 98.6 F (37 C)   Ht 5' 6 (1.676 m)   Wt 162 lb (73.5 kg)   SpO2 (!) 89%   BMI 26.15 kg/m      Assessment & Plan:  KELDAN EPLIN comes in today with chief complaint of head congestion (Cough/Can't keep food down/X 6 days/)   Diagnosis and orders addressed:  1. Acute cough (Primary) - DG Chest 2 View; Future  2. Chronic obstructive pulmonary disease with acute exacerbation (HCC)  3. Weakness  4. Abnormal gait    When waiting for his chest x-ray, pt's O2 dropped to 85%. Given O2 and his new onset of weakness. His gait is very unsteady today. Pt  refuses EMS and has family that will come get him and take him to ED.  Approx 45 mins spent with patient, education, and chart review  Bari Learn, FNP

## 2023-10-15 ENCOUNTER — Ambulatory Visit: Admitting: Family

## 2023-10-16 LAB — CULTURE, BLOOD (ROUTINE X 2)
Culture: NO GROWTH
Culture: NO GROWTH
Special Requests: ADEQUATE

## 2023-10-17 ENCOUNTER — Ambulatory Visit (INDEPENDENT_AMBULATORY_CARE_PROVIDER_SITE_OTHER): Admitting: Family Medicine

## 2023-10-17 ENCOUNTER — Encounter: Payer: Self-pay | Admitting: Family Medicine

## 2023-10-17 VITALS — BP 117/70 | HR 96 | Temp 98.0°F | Ht 66.0 in | Wt 164.0 lb

## 2023-10-17 DIAGNOSIS — E1159 Type 2 diabetes mellitus with other circulatory complications: Secondary | ICD-10-CM | POA: Diagnosis not present

## 2023-10-17 DIAGNOSIS — I152 Hypertension secondary to endocrine disorders: Secondary | ICD-10-CM

## 2023-10-17 DIAGNOSIS — E1169 Type 2 diabetes mellitus with other specified complication: Secondary | ICD-10-CM

## 2023-10-17 DIAGNOSIS — J410 Simple chronic bronchitis: Secondary | ICD-10-CM

## 2023-10-17 MED ORDER — BUDESONIDE-FORMOTEROL FUMARATE 80-4.5 MCG/ACT IN AERO: 2.0000 | INHALATION_SPRAY | Freq: Two times a day (BID) | RESPIRATORY_TRACT | 3 refills | Status: AC

## 2023-10-17 NOTE — Progress Notes (Signed)
 Established Patient Office Visit  Subjective   Patient ID: Ryan Mcintyre, male    DOB: 1955/07/11  Age: 68 y.o. MRN: [id number]  Chief Complaint  Patient presents with   Hospitalization Follow-up    COPD exacerbation    HPI  History of Present Illness   Ryan Mcintyre is a 68 year old male with COPD, type 2 diabetes, and HTN who presents for follow-up after an ER visit for COPD exacerbation  Acute hypoxemia and fever - Evaluated in the emergency department on August 7th for oxygen saturation of 85% and fever - Received oxygen therapy, intravenous fluids, breathing treatment, Rocephin, azithromycin, and a steroid injection - Blood cultures and tests for COVID-19, influenza, and RSV were negative - Chest x-ray was negative - Discharged after clinical improvement - Since discharge, no fever, shortness of breath, wheezing, or chest pain - Completed a five-day course of prednisone - Has not used albuterol inhaler since discharge  Glycemic control - Difficulty managing blood glucose levels, potentially related to recent prednisone use and acute illness  Tobacco use - Current smoker  Infectious exposure risk - Lives with a six-year-old grandchild who attends school        ROS As per HPI.    Objective:     BP 117/70   Pulse 96   Temp 98 F (36.7 C)   Ht 5' 6 (1.676 m)   Wt 164 lb (74.4 kg)   SpO2 93%   BMI 26.47 kg/m    Physical Exam Vitals and nursing note reviewed.  Constitutional:      General: He is not in acute distress.    Appearance: Normal appearance. He is not ill-appearing, toxic-appearing or diaphoretic.  HENT:     Right Ear: Tympanic membrane, ear canal and external ear normal.     Left Ear: Tympanic membrane, ear canal and external ear normal.     Nose: Nose normal.     Mouth/Throat:     Mouth: Mucous membranes are moist.     Pharynx: Oropharynx is clear. No oropharyngeal exudate or posterior oropharyngeal erythema.  Cardiovascular:      Rate and Rhythm: Normal rate and regular rhythm.     Pulses: Normal pulses.     Heart sounds: Normal heart sounds. No murmur heard. Pulmonary:     Effort: Pulmonary effort is normal. No respiratory distress.     Breath sounds: Normal breath sounds. No stridor. No wheezing, rhonchi or rales.  Chest:     Chest wall: No tenderness.  Abdominal:     General: Bowel sounds are normal.     Palpations: Abdomen is soft.  Musculoskeletal:     Cervical back: Neck supple. No tenderness.     Right lower leg: No edema.     Left lower leg: No edema.  Lymphadenopathy:     Cervical: No cervical adenopathy.  Skin:    General: Skin is warm and dry.  Neurological:     General: No focal deficit present.     Mental Status: He is alert and oriented to person, place, and time.  Psychiatric:        Mood and Affect: Mood normal.        Behavior: Behavior normal.      No results found for any visits on 10/17/23.    The 10-year ASCVD risk score (Arnett DK, et al., 2019) is: 35.1%    Assessment & Plan:   Simple chronic bronchitis (HCC) Recent exacerbation treated in ER. Symptoms resolved. -  Prescribe Symbicort  inhaler, 2 puffs BID. - Send prescription to [facility] pharmacy. - Instruct to notify if medication is expensive or not covered by insurance. -     Budesonide -Formoterol  Fumarate; Inhale 2 puffs into the lungs 2 (two) times daily.  Dispense: 1 each; Refill: 3  Type 2 diabetes mellitus with other specified complication, without long-term current use of insulin  (HCC) Recent hyperglycemia likely due to prednisone  use and illness. Blood glucose expected to stabilize post-prednisone .  Hypertension associated with diabetes (HCC) BP at goal.       Return in about 2 months (around 12/31/2023) for chronic follow up with PCP.   The patient indicates understanding of these issues and agrees with the plan.  Annabella CHRISTELLA Search, FNP

## 2023-10-23 NOTE — Progress Notes (Signed)
 Complex Care Management Care Guide Note  10/23/2023 Name: [name] MRN: [id number] DOB: 1955/10/25  Ryan Mcintyre is a 68 y.o. year old male who is a primary care patient of Lavell Bari LABOR, FNP and is actively engaged with the care management team. I reached out to Wilhelmina JINNY Sieve by phone today to assist with re-scheduling  with the Pharmacist.  Follow up plan: Telephone appointment with complex care management team member scheduled for:  10/26/2023  Jeoffrey Buffalo , RMA     [place]  [facility], [facility] Guide  Direct Dial : [phone]  Website: [email or web address]

## 2023-10-26 ENCOUNTER — Inpatient Hospital Stay: Admitting: Family

## 2023-10-26 ENCOUNTER — Other Ambulatory Visit

## 2023-11-26 DIAGNOSIS — I2510 Atherosclerotic heart disease of native coronary artery without angina pectoris: Secondary | ICD-10-CM | POA: Diagnosis not present

## 2023-11-26 DIAGNOSIS — E663 Overweight: Secondary | ICD-10-CM | POA: Diagnosis not present

## 2023-11-26 DIAGNOSIS — E1169 Type 2 diabetes mellitus with other specified complication: Secondary | ICD-10-CM | POA: Diagnosis not present

## 2023-11-26 DIAGNOSIS — Z008 Encounter for other general examination: Secondary | ICD-10-CM | POA: Diagnosis not present

## 2023-11-26 DIAGNOSIS — Z6827 Body mass index (BMI) 27.0-27.9, adult: Secondary | ICD-10-CM | POA: Diagnosis not present

## 2023-11-26 DIAGNOSIS — E1165 Type 2 diabetes mellitus with hyperglycemia: Secondary | ICD-10-CM | POA: Diagnosis not present

## 2023-12-15 ENCOUNTER — Other Ambulatory Visit: Payer: Self-pay | Admitting: Family

## 2023-12-15 DIAGNOSIS — I152 Hypertension secondary to endocrine disorders: Secondary | ICD-10-CM

## 2023-12-18 ENCOUNTER — Telehealth: Payer: Self-pay

## 2023-12-18 NOTE — Progress Notes (Unsigned)
 Complex Care Management Care Guide Note  12/18/2023 Name: Ryan Mcintyre MRN: [id number] DOB: 1955/12/31  Ryan Mcintyre is a 68 y.o. year old male who is a primary care patient of Lavell Bari LABOR, FNP and is actively engaged with the care management team. I reached out to Wilhelmina JINNY Sieve by phone today to assist with re-scheduling  with the RN Case Manager.  Follow up plan: Unsuccessful telephone outreach attempt made. A HIPAA compliant phone message was left for the patient providing contact information and requesting a return call.  Jeoffrey Buffalo , RMA     [facility]  [facility], [facility] Guide  Direct Dial : [phone]  Website: [email or web address]

## 2023-12-25 DIAGNOSIS — E1122 Type 2 diabetes mellitus with diabetic chronic kidney disease: Secondary | ICD-10-CM | POA: Diagnosis not present

## 2023-12-25 DIAGNOSIS — Z008 Encounter for other general examination: Secondary | ICD-10-CM | POA: Diagnosis not present

## 2023-12-25 DIAGNOSIS — E1169 Type 2 diabetes mellitus with other specified complication: Secondary | ICD-10-CM | POA: Diagnosis not present

## 2024-01-14 ENCOUNTER — Other Ambulatory Visit: Payer: Self-pay | Admitting: *Deleted

## 2024-01-14 NOTE — Patient Outreach (Signed)
 Complex Care Management   Visit Note  01/14/2024  Name:  Ryan Mcintyre MRN: [id number] DOB: 26-Jan-1956  Situation: Referral received for Complex Care Management related to COPD and Diabetes with Complications I obtained verbal consent from Patient.  Visit completed with Patient  on the phone  Background:   Past Medical History:  Diagnosis Date   COPD (chronic obstructive pulmonary disease) (HCC) 02/21/2017   Diabetes mellitus without complication (HCC)    GERD (gastroesophageal reflux disease)    Hyperlipidemia     Assessment: Patient Reported Symptoms:  Cognitive Cognitive Status: No symptoms reported Cognitive/Intellectual Conditions Management [RPT]: None reported or documented in medical history or problem list   Health Maintenance Behaviors: Annual physical exam Healing Pattern: Average Health Facilitated by: Rest  Neurological Neurological Review of Symptoms: No symptoms reported Neurological Management Strategies: Routine screening Neurological Self-Management Outcome: 4 (good)  HEENT HEENT Symptoms Reported: No symptoms reported HEENT Management Strategies: Routine screening HEENT Self-Management Outcome: 4 (good)    Cardiovascular Cardiovascular Symptoms Reported: No symptoms reported Does patient have uncontrolled Hypertension?: No Cardiovascular Management Strategies: Routine screening Cardiovascular Self-Management Outcome: 4 (good)  Respiratory Respiratory Symptoms Reported: No symptoms reported Respiratory Management Strategies: Routine screening Respiratory Self-Management Outcome: 4 (good)  Endocrine Endocrine Symptoms Reported: No symptoms reported Is patient diabetic?: Yes Is patient checking blood sugars at home?: Yes List most recent blood sugar readings, include date and time of day: 01-14-2024 CBG 125 Endocrine Self-Management Outcome: 4 (good)  Gastrointestinal Gastrointestinal Symptoms Reported: No symptoms reported Gastrointestinal  Self-Management Outcome: 4 (good)    Genitourinary Genitourinary Symptoms Reported: No symptoms reported Genitourinary Self-Management Outcome: 4 (good)  Integumentary Integumentary Symptoms Reported: No symptoms reported Skin Management Strategies: Routine screening Skin Self-Management Outcome: 4 (good)  Musculoskeletal Musculoskelatal Symptoms Reviewed: No symptoms reported Musculoskeletal Management Strategies: Routine screening Musculoskeletal Self-Management Outcome: 4 (good) Falls in the past year?: No Number of falls in past year: 1 or less Was there an injury with Fall?: No Fall Risk Category Calculator: 0 Patient Fall Risk Level: Low Fall Risk Patient at Risk for Falls Due to: No Fall Risks Fall risk Follow up: Falls evaluation completed, Education provided  Psychosocial Psychosocial Symptoms Reported: No symptoms reported Behavioral Management Strategies: Coping strategies Behavioral Health Self-Management Outcome: 4 (good) Major Change/Loss/Stressor/Fears (CP): Denies Techniques to Cope with Loss/Stress/Change: Not applicable Quality of Family Relationships: involved, helpful, supportive Do you feel physically threatened by others?: No    01/14/2024    PHQ2-9 Depression Screening   Little interest or pleasure in doing things Not at all  Feeling down, depressed, or hopeless Not at all  PHQ-2 - Total Score 0  Trouble falling or staying asleep, or sleeping too much    Feeling tired or having little energy    Poor appetite or overeating     Feeling bad about yourself - or that you are a failure or have let yourself or your family down    Trouble concentrating on things, such as reading the newspaper or watching television    Moving or speaking so slowly that other people could have noticed.  Or the opposite - being so fidgety or restless that you have been moving around a lot more than usual    Thoughts that you would be better off dead, or hurting yourself in some way     PHQ2-9 Total Score    If you checked off any problems, how difficult have these problems made it for you to do your work, take care of things  at home, or get along with other people    Depression Interventions/Treatment      Vitals:   01/14/24 1018  BP: 130/80  Pulse: 93    Medications Reviewed Today     Reviewed by Bertrum Rosina HERO, RN (Registered Nurse) on 01/14/24 at 1028  Med List Status: <None>   Medication Order Taking? Sig Documenting Provider Last Dose Status Informant  albuterol  (VENTOLIN  HFA) 108 (90 Base) MCG/ACT inhaler [id number] Yes Inhale 2 puffs into the lungs every 4 (four) hours as needed for wheezing or shortness of breath. Cleotilde Rogue, MD  Active   aspirin  EC [facility] ASPIRIN  ADULT LOW DOSE) 81 MG tablet [id number] Yes TAKE 1 TABLET BY MOUTH ONCE DAILY.  SWALLOW WHOLE Hawks, Bari LABOR, FNP  Active Self, Spouse/Significant Other, Pharmacy Records  atorvastatin  (LIPITOR) 40 MG tablet [id number] Yes Take 1 tablet (40 mg total) by mouth daily. Lavell Bari LABOR, FNP  Active Self, Spouse/Significant Other, Pharmacy Records  blood glucose meter kit and supplies [id number] Yes Dispense based on patient and insurance preference. Use up to four times daily as directed. (FOR ICD-10 E10.9, E11.9). Lavell Bari LABOR, FNP  Active Self, Spouse/Significant Other, Pharmacy Records  budesonide -formoterol  (SYMBICORT ) 80-4.5 MCG/ACT inhaler [id number] Yes Inhale 2 puffs into the lungs 2 (two) times daily. Joesph Annabella HERO, FNP  Active   Continuous Glucose Sensor (FREESTYLE LIBRE 3 PLUS SENSOR) [place] [id number]  USE AS DIRECTED CHANGE EVERY 15 DAYS TO MONITOR BLOOD GLUCOSE CONTINUOUSLY  Patient not taking: Reported on 01/14/2024   [provider]  Active Self, Spouse/Significant Other, Pharmacy Records  dapagliflozin  propanediol (FARXIGA ) 10 MG TABS tablet [id number] Yes Take 1 tablet (10 mg total) by mouth daily before breakfast. Lavell Bari LABOR, FNP  Active Self, Spouse/Significant Other,  Pharmacy Records  glipiZIDE (GLUCOTROL) 5 MG tablet [id number] Yes Take 5 mg by mouth 2 (two) times daily before a meal.  Patient taking differently: Take 2.5 mg by mouth 2 (two) times daily before a meal.   [provider]  Active   glucose blood test strip [id number] Yes Test daily. Lavell Bari LABOR, FNP  Active Self, Spouse/Significant Other, Pharmacy Records  lisinopril  (ZESTRIL ) 20 MG tablet [id number]  Take 1 tablet by mouth once daily  Patient not taking: Reported on 01/14/2024   Lavell Bari LABOR, FNP  Active   losartan (COZAAR) 100 MG tablet [id number] Yes Take 100 mg by mouth daily. [provider]  Active   metFORMIN  (GLUCOPHAGE ) 1000 MG tablet [id number] Yes Take 1 tablet (1,000 mg total) by mouth 2 (two) times daily with a meal. Hawks, Bari LABOR, FNP  Active Self, Spouse/Significant Other, Pharmacy Records  omeprazole  (PRILOSEC) 40 MG capsule [id number] Yes Take 1 capsule (40 mg total) by mouth daily. Lavell Bari LABOR, FNP  Active Self, Spouse/Significant Other, Pharmacy Records  sildenafil  (VIAGRA ) 100 MG tablet [id number]  Take 0.5-1 tablets (50-100 mg total) by mouth daily as needed for erectile dysfunction.  Patient not taking: Reported on 01/14/2024   Lavell Bari LABOR, FNP  Consider Medication Status and Discontinue Self, Spouse/Significant Other, Pharmacy Records            Recommendation:   Continue Current Plan of Care Follow up on refills for [facility]  Follow Up Plan:   Telephone follow-up in 1 month  Rosina Bertrum, BSN RN [facility], [facility] Health RN Care Manager Direct Dial : [phone]  Fax: [phone]

## 2024-01-14 NOTE — Patient Instructions (Signed)
 Visit Information  Thank you for taking time to visit with me today. Please don't hesitate to contact me if I can be of assistance to you before our next scheduled appointment.  Your next care management appointment is by telephone on 02-13-2024 at 10:00 am  Telephone follow-up in 1 month  Please call the care guide team at [phone] if you need to cancel, schedule, or reschedule an appointment.   Please call the Suicide and Crisis Lifeline: 988 call the [place]  National Suicide Prevention Lifeline: 7540888643 or TTY: (310)824-9369 TTY 440 231 7187) to talk to a trained counselor call 1-800-273-TALK (toll free, 24 hour hotline) if you are experiencing a Mental Health or Behavioral Health Crisis or need someone to talk to.  Rosina Forte, BSN RN [facility], [facility] Health RN Care Manager Direct Dial : [phone]  Fax: [phone]

## 2024-01-18 ENCOUNTER — Other Ambulatory Visit: Payer: Self-pay | Admitting: *Deleted

## 2024-01-18 MED ORDER — FREESTYLE LIBRE 3 PLUS SENSOR MISC: 2 refills | Status: AC

## 2024-02-11 ENCOUNTER — Telehealth: Payer: Self-pay | Admitting: *Deleted

## 2024-02-11 NOTE — Patient Outreach (Signed)
  Care Management   Follow Up Note   02/11/2024 Name: Ryan Mcintyre MRN: [id number] DOB: 08-06-1955   Referred by: Lavell Bari LABOR, FNP Reason for referral : Complex Care Management   An unsuccessful telephone outreach was attempted today. The patient was referred to the case management team for assistance with care management and care coordination.   Follow Up Plan: The care management team will reach out to the patient again over the next 2 days.   Rosina Forte, BSN RN [facility], [facility] Health RN Care Manager Direct Dial : [phone]  Fax: [phone]

## 2024-02-12 NOTE — Telephone Encounter (Unsigned)
 Copied from CRM #[id number]. Topic: General - Other >> Feb 11, 2024  3:11 PM Shamecia H wrote: Reason for CRM: Patient missed a call from  Rosina Forte, BSN RN and I informed him of his new appointment but also to listen and look out for calls from [facility] over the next few days.

## 2024-02-13 ENCOUNTER — Encounter: Payer: Self-pay | Admitting: *Deleted

## 2024-02-13 ENCOUNTER — Other Ambulatory Visit: Payer: Self-pay | Admitting: *Deleted

## 2024-02-13 NOTE — Patient Instructions (Signed)
 Visit Information  Thank you for taking time to visit with me today. Please don't hesitate to contact me if I can be of assistance to you before our next scheduled appointment.  Your next care management appointment is by telephone on 03-14-2024 at 11:00 am  Telephone follow-up in 1 month  Please call the care guide team at [phone] if you need to cancel, schedule, or reschedule an appointment.   Please call the Suicide and Crisis Lifeline: 988 call the [place]  National Suicide Prevention Lifeline: 740-604-7012 or TTY: (220)731-8807 TTY 9862632039) to talk to a trained counselor call 1-800-273-TALK (toll free, 24 hour hotline) if you are experiencing a Mental Health or Behavioral Health Crisis or need someone to talk to.  Rosina Forte, BSN RN [facility], [facility] Health RN Care Manager Direct Dial : [phone]  Fax: [phone]

## 2024-02-13 NOTE — Patient Instructions (Signed)
 Ryan Mcintyre - I am sorry I was unable to reach you today for our scheduled appointment. I work with Lavell Bari LABOR, FNP and am calling to support your healthcare needs. Please contact me at [phone] at your earliest convenience. I look forward to speaking with you soon.   Thank you,  Rosina Forte, BSN RN [facility], [facility] Health RN Care Manager Direct Dial : [phone]  Fax: [phone]

## 2024-02-13 NOTE — Patient Outreach (Signed)
 Complex Care Management   Visit Note  02/13/2024  Name:  Ryan Mcintyre MRN: [id number] DOB: [birth year]  Situation: Referral received for Complex Care Management related to COPD and Diabetes with Complications I obtained verbal consent from Patient.  Visit completed with Patient  on the phone  During outreach, patient reported elevated blood pressure readings of 157/106 and 170/98. A repeat check at the end of the call was 169/89. Reviewed with patient the target SBP/DBP goals, importance of medication adherence, and daily BP monitoring/recording. Provided education on the DASH diet and when to seek emergency care. Patient may benefit from a nurse visit to verify accuracy of home BP device. In-basket message sent to PCP for follow-up.  Background:   Past Medical History:  Diagnosis Date   COPD (chronic obstructive pulmonary disease) (HCC) 02/21/2017   Diabetes mellitus without complication (HCC)    GERD (gastroesophageal reflux disease)    Hyperlipidemia     Assessment: Patient Reported Symptoms:  Cognitive Cognitive Status: No symptoms reported Cognitive/Intellectual Conditions Management [RPT]: None reported or documented in medical history or problem list   Health Maintenance Behaviors: Annual physical exam Healing Pattern: Average Health Facilitated by: Prayer/meditation  Neurological Neurological Review of Symptoms: No symptoms reported    HEENT HEENT Symptoms Reported: No symptoms reported      Cardiovascular Does patient have uncontrolled Hypertension?: Yes Is patient checking Blood Pressure at home?: Yes Patient's Recent BP reading at home: 157/106 (patient reported) Cardiovascular Management Strategies: Routine screening Cardiovascular Self-Management Outcome: 3 (uncertain)  Respiratory Respiratory Symptoms Reported: No symptoms reported Respiratory Management Strategies: Routine screening Respiratory Self-Management Outcome: 4 (good)  Endocrine Endocrine Symptoms  Reported: No symptoms reported Is patient diabetic?: Yes Is patient checking blood sugars at home?: Yes List most recent blood sugar readings, include date and time of day: 02-13-2024 CBG 119 Endocrine Self-Management Outcome: 4 (good)  Gastrointestinal Gastrointestinal Symptoms Reported: No symptoms reported      Genitourinary Genitourinary Symptoms Reported: No symptoms reported    Integumentary Integumentary Symptoms Reported: No symptoms reported    Musculoskeletal Musculoskelatal Symptoms Reviewed: No symptoms reported   Falls in the past year?: No Number of falls in past year: 1 or less Was there an injury with Fall?: No Fall Risk Category Calculator: 0 Patient Fall Risk Level: Low Fall Risk Patient at Risk for Falls Due to: No Fall Risks Fall risk Follow up: Falls evaluation completed  Psychosocial Psychosocial Symptoms Reported: No symptoms reported Behavioral Management Strategies: Coping strategies Behavioral Health Self-Management Outcome: 4 (good) Major Change/Loss/Stressor/Fears (CP): Denies Techniques to Cope with Loss/Stress/Change: Not applicable      02/13/2024    PHQ2-9 Depression Screening   Little interest or pleasure in doing things Not at all  Feeling down, depressed, or hopeless Not at all  PHQ-2 - Total Score 0  Trouble falling or staying asleep, or sleeping too much    Feeling tired or having little energy    Poor appetite or overeating     Feeling bad about yourself - or that you are a failure or have let yourself or your family down    Trouble concentrating on things, such as reading the newspaper or watching television    Moving or speaking so slowly that other people could have noticed.  Or the opposite - being so fidgety or restless that you have been moving around a lot more than usual    Thoughts that you would be better off dead, or hurting yourself in some way  PHQ2-9 Total Score    If you checked off any problems, how difficult have these  problems made it for you to do your work, take care of things at home, or get along with other people    Depression Interventions/Treatment      Today's Vitals   02/13/24 1532  BP: (!) 169/89   Pain Score: 0-No pain  Medications Reviewed Today     Reviewed by Bertrum Rosina HERO, RN (Registered Nurse) on 02/13/24 at 1510  Med List Status: <None>   Medication Order Taking? Sig Documenting Provider Last Dose Status Informant  albuterol  (VENTOLIN  HFA) 108 (90 Base) MCG/ACT inhaler [id number] Yes Inhale 2 puffs into the lungs every 4 (four) hours as needed for wheezing or shortness of breath. Cleotilde Rogue, MD  Active   aspirin  EC [facility] ASPIRIN  ADULT LOW DOSE) 81 MG tablet [id number] Yes TAKE 1 TABLET BY MOUTH ONCE DAILY.  SWALLOW WHOLE Hawks, Bari LABOR, FNP  Active Self, Spouse/Significant Other, Pharmacy Records  atorvastatin  (LIPITOR) 40 MG tablet [id number] Yes Take 1 tablet (40 mg total) by mouth daily. Lavell Bari LABOR, FNP  Active Self, Spouse/Significant Other, Pharmacy Records  blood glucose meter kit and supplies [id number] Yes Dispense based on patient and insurance preference. Use up to four times daily as directed. (FOR ICD-10 E10.9, E11.9). Lavell Bari LABOR, FNP  Active Self, Spouse/Significant Other, Pharmacy Records  budesonide -formoterol  (SYMBICORT ) 80-4.5 MCG/ACT inhaler [id number] Yes Inhale 2 puffs into the lungs 2 (two) times daily. Joesph Annabella HERO, FNP  Active   Continuous Glucose Sensor (FREESTYLE LIBRE 3 PLUS SENSOR) [place] [id number] Yes Change sensor every 15 days.USE AS DIRECTED CHANGE EVERY 15 DAYS TO MONITOR BLOOD GLUCOSE CONTINUOUSLY Hawks, Christy A, FNP  Active   dapagliflozin  propanediol (FARXIGA ) 10 MG TABS tablet [id number] Yes Take 1 tablet (10 mg total) by mouth daily before breakfast. Lavell Bari LABOR, FNP  Active Self, Spouse/Significant Other, Pharmacy Records  glipiZIDE (GLUCOTROL) 5 MG tablet [id number] Yes Take 5 mg by mouth 2 (two) times daily before a meal.   Patient taking differently: Take 2.5 mg by mouth 2 (two) times daily before a meal.   [provider]  Active   glucose blood test strip [id number] Yes Test daily. Lavell Bari LABOR, FNP  Active Self, Spouse/Significant Other, Pharmacy Records  lisinopril  (ZESTRIL ) 20 MG tablet [id number] Yes Take 1 tablet by mouth once daily Lavell Bari A, FNP  Active   losartan (COZAAR) 100 MG tablet [id number] Yes Take 100 mg by mouth daily. [provider]  Active   metFORMIN  (GLUCOPHAGE ) 1000 MG tablet [id number] Yes Take 1 tablet (1,000 mg total) by mouth 2 (two) times daily with a meal. Lavell Bari LABOR, FNP  Active Self, Spouse/Significant Other, Pharmacy Records  omeprazole  (PRILOSEC) 40 MG capsule [id number] Yes Take 1 capsule (40 mg total) by mouth daily. Lavell Bari LABOR, FNP  Active Self, Spouse/Significant Other, Pharmacy Records  sildenafil  (VIAGRA ) 100 MG tablet [id number]  Take 0.5-1 tablets (50-100 mg total) by mouth daily as needed for erectile dysfunction.  Patient not taking: Reported on 02/13/2024   Lavell Bari LABOR, FNP  Active Self, Spouse/Significant Other, Pharmacy Records            Recommendation:   Acute PCP follow-up BP Check. Continue Current Plan of Care  Follow Up Plan:   Telephone follow-up in 1 month  Rosina Bertrum, BSN RN [facility], [facility] Health RN Care Manager Direct Dial : [phone]  Fax: [phone]

## 2024-02-21 ENCOUNTER — Ambulatory Visit: Admitting: Family

## 2024-02-21 ENCOUNTER — Encounter: Payer: Self-pay | Admitting: Family

## 2024-02-21 VITALS — BP 138/79 | HR 82 | Temp 97.6°F | Ht 66.0 in | Wt 175.4 lb

## 2024-02-21 DIAGNOSIS — E1169 Type 2 diabetes mellitus with other specified complication: Secondary | ICD-10-CM

## 2024-02-21 DIAGNOSIS — K219 Gastro-esophageal reflux disease without esophagitis: Secondary | ICD-10-CM

## 2024-02-21 DIAGNOSIS — Z0001 Encounter for general adult medical examination with abnormal findings: Secondary | ICD-10-CM

## 2024-02-21 DIAGNOSIS — E1159 Type 2 diabetes mellitus with other circulatory complications: Secondary | ICD-10-CM | POA: Diagnosis not present

## 2024-02-21 DIAGNOSIS — I700 Atherosclerosis of aorta: Secondary | ICD-10-CM | POA: Diagnosis not present

## 2024-02-21 DIAGNOSIS — Z7984 Long term (current) use of oral hypoglycemic drugs: Secondary | ICD-10-CM | POA: Diagnosis not present

## 2024-02-21 DIAGNOSIS — K760 Fatty (change of) liver, not elsewhere classified: Secondary | ICD-10-CM | POA: Diagnosis not present

## 2024-02-21 DIAGNOSIS — E663 Overweight: Secondary | ICD-10-CM | POA: Diagnosis not present

## 2024-02-21 DIAGNOSIS — E785 Hyperlipidemia, unspecified: Secondary | ICD-10-CM | POA: Diagnosis not present

## 2024-02-21 DIAGNOSIS — J410 Simple chronic bronchitis: Secondary | ICD-10-CM | POA: Diagnosis not present

## 2024-02-21 DIAGNOSIS — I152 Hypertension secondary to endocrine disorders: Secondary | ICD-10-CM

## 2024-02-21 DIAGNOSIS — Z1211 Encounter for screening for malignant neoplasm of colon: Secondary | ICD-10-CM

## 2024-02-21 DIAGNOSIS — Z0000 Encounter for general adult medical examination without abnormal findings: Secondary | ICD-10-CM

## 2024-02-21 DIAGNOSIS — E119 Type 2 diabetes mellitus without complications: Secondary | ICD-10-CM

## 2024-02-21 LAB — BAYER DCA HB A1C WAIVED: HB A1C (BAYER DCA - WAIVED): 8.8 % — ABNORMAL HIGH (ref 4.8–5.6)

## 2024-02-21 MED ORDER — METFORMIN HCL 1000 MG PO TABS: 1000.0000 mg | ORAL_TABLET | Freq: Two times a day (BID) | ORAL | 2 refills | Status: AC

## 2024-02-21 NOTE — Patient Instructions (Signed)
 Health Maintenance After Age 70 After age 27, you are at a higher risk for certain long-term diseases and infections as well as injuries from falls. Falls are a major cause of broken bones and head injuries in people who are older than age 73. Getting regular preventive care can help to keep you healthy and well. Preventive care includes getting regular testing and making lifestyle changes as recommended by your health care provider. Talk with your health care provider about: Which screenings and tests you should have. A screening is a test that checks for a disease when you have no symptoms. A diet and exercise plan that is right for you. What should I know about screenings and tests to prevent falls? Screening and testing are the best ways to find a health problem early. Early diagnosis and treatment give you the best chance of managing medical conditions that are common after age 90. Certain conditions and lifestyle choices may make you more likely to have a fall. Your health care provider may recommend: Regular vision checks. Poor vision and conditions such as cataracts can make you more likely to have a fall. If you wear glasses, make sure to get your prescription updated if your vision changes. Medicine review. Work with your health care provider to regularly review all of the medicines you are taking, including over-the-counter medicines. Ask your health care provider about any side effects that may make you more likely to have a fall. Tell your health care provider if any medicines that you take make you feel dizzy or sleepy. Strength and balance checks. Your health care provider may recommend certain tests to check your strength and balance while standing, walking, or changing positions. Foot health exam. Foot pain and numbness, as well as not wearing proper footwear, can make you more likely to have a fall. Screenings, including: Osteoporosis screening. Osteoporosis is a condition that causes  the bones to get weaker and break more easily. Blood pressure screening. Blood pressure changes and medicines to control blood pressure can make you feel dizzy. Depression screening. You may be more likely to have a fall if you have a fear of falling, feel depressed, or feel unable to do activities that you used to do. Alcohol  use screening. Using too much alcohol  can affect your balance and may make you more likely to have a fall. Follow these instructions at home: Lifestyle Do not drink alcohol  if: Your health care provider tells you not to drink. If you drink alcohol : Limit how much you have to: 0-1 drink a day for women. 0-2 drinks a day for men. Know how much alcohol  is in your drink. In the [place] one drink equals one 12 oz bottle of beer (355 mL), one 5 oz glass of wine (148 mL), or one 1 oz glass of hard liquor (44 mL). Do not use any products that contain nicotine or tobacco. These products include cigarettes, chewing tobacco, and vaping devices, such as e-cigarettes. If you need help quitting, ask your health care provider. Activity  Follow a regular exercise program to stay fit. This will help you maintain your balance. Ask your health care provider what types of exercise are appropriate for you. If you need a cane or walker, use it as recommended by your health care provider. Wear supportive shoes that have nonskid soles. Safety  Remove any tripping hazards, such as rugs, cords, and clutter. Install safety equipment such as grab bars in bathrooms and safety rails on stairs. Keep rooms and walkways  well-lit. General instructions Talk with your health care provider about your risks for falling. Tell your health care provider if: You fall. Be sure to tell your health care provider about all falls, even ones that seem minor. You feel dizzy, tiredness (fatigue), or off-balance. Take over-the-counter and prescription medicines only as told by your health care provider. These include  supplements. Eat a healthy diet and maintain a healthy weight. A healthy diet includes low-fat dairy products, low-fat (lean) meats, and fiber from whole grains, beans, and lots of fruits and vegetables. Stay current with your vaccines. Schedule regular health, dental, and eye exams. Summary Having a healthy lifestyle and getting preventive care can help to protect your health and wellness after age 15. Screening and testing are the best way to find a health problem early and help you avoid having a fall. Early diagnosis and treatment give you the best chance for managing medical conditions that are more common for people who are older than age 42. Falls are a major cause of broken bones and head injuries in people who are older than age 64. Take precautions to prevent a fall at home. Work with your health care provider to learn what changes you can make to improve your health and wellness and to prevent falls. This information is not intended to replace advice given to you by your health care provider. Make sure you discuss any questions you have with your health care provider. Document Revised: 07/12/2020 Document Reviewed: 07/12/2020 Elsevier Patient Education  2024 [employer].

## 2024-02-21 NOTE — Progress Notes (Signed)
 Subjective:    Patient ID: Ryan Mcintyre, male    DOB: [birth year], 68 y.o.   MRN: [id number]  Chief Complaint  Patient presents with   Medical Management of Chronic Issues   Pt presents to the office today for CPE and chronic follow up.   He has COPD and quit smoking 08/2022.  Denies any SOB. Reports he smoked for 57 years. States he is riding his bike and hiking.    He had ED and takes viagra  as needed   He has aortic atherosclerosis is staking Crestor  5 mg, but has myalgia on the 10 mg.   Gastroesophageal Reflux He complains of belching and heartburn. He reports no choking. This is a chronic problem. The current episode started more than 1 year ago. The problem occurs occasionally. The symptoms are aggravated by certain foods. He has tried a PPI for the symptoms. The treatment provided moderate relief.  Hyperlipidemia This is a chronic problem. The current episode started more than 1 year ago. The problem is controlled. Recent lipid tests were reviewed and are normal. Exacerbating diseases include obesity. Current antihyperlipidemic treatment includes statins. The current treatment provides moderate improvement of lipids. Risk factors for coronary artery disease include dyslipidemia, diabetes mellitus, hypertension, male sex and a sedentary lifestyle.  Diabetes He presents for his follow-up diabetic visit. He has type 2 diabetes mellitus. Pertinent negatives for diabetes include no blurred vision and no foot paresthesias. Symptoms are stable. Risk factors for coronary artery disease include dyslipidemia, diabetes mellitus, hypertension and sedentary lifestyle. He is following a generally healthy diet. His overall blood glucose range is 110-130 mg/dl. Eye exam is current.      Review of Systems  Eyes:  Negative for blurred vision.  Respiratory:  Negative for choking.   Gastrointestinal:  Positive for heartburn.  All other systems reviewed and are negative.  Family History   Problem Relation Age of Onset   Hypertension Mother    Diabetes Mother    Diabetes Sister    Early death Sister    Early death Brother    Early death Brother    Social History   Socioeconomic History   Marital status: Married    Spouse name: Mary   Number of children: 3   Years of education: 12   Highest education level: 12th grade  Occupational History   Occupation: designer, fashion/clothing    Comment: Disabled  Tobacco Use   Smoking status: Former    Current packs/day: 0.00    Average packs/day: 0.3 packs/day for 50.0 years (12.5 ttl pk-yrs)    Types: Cigarettes    Start date: 04/06/1970    Quit date: 04/06/2020    Years since quitting: 3.8    Passive exposure: Past   Smokeless tobacco: Never   Tobacco comments:    about 3 to 4 cigarettes a day - finally quit completely  Vaping Use   Vaping status: Never Used  Substance and Sexual Activity   Alcohol use: No    Alcohol/week: 0.0 standard drinks of alcohol   Drug use: No   Sexual activity: Yes  Other Topics Concern   Not on file  Social History Narrative   Lives on one level home with wife   They enjoy going dancing every Thursday night   Social Drivers of Health   Tobacco Use: Medium Risk (02/21/2024)   Patient History    Smoking Tobacco Use: Former    Smokeless Tobacco Use: Never    Passive Exposure: Past  Surveyor, Quantity  Resource Strain: Low Risk (10/03/2023)   Overall Financial Resource Strain (CARDIA)    Difficulty of Paying Living Expenses: Not hard at all  Food Insecurity: No Food Insecurity (01/14/2024)   Epic    Worried About Programme Researcher, Broadcasting/film/video in the Last Year: Never true    Ran Out of Food in the Last Year: Never true  Transportation Needs: No Transportation Needs (01/14/2024)   Epic    Lack of Transportation (Medical): No    Lack of Transportation (Non-Medical): No  Physical Activity: Sufficiently Active (10/03/2023)   Exercise Vital Sign    Days of Exercise per Week: 7 days    Minutes of Exercise per Session: 30  min  Stress: No Stress Concern Present (10/03/2023)   [employer] of Occupational Health - Occupational Stress Questionnaire    Feeling of Stress: Not at all  Social Connections: Moderately Integrated (10/03/2023)   Social Connection and Isolation Panel    Frequency of Communication with Friends and Family: More than three times a week    Frequency of Social Gatherings with Friends and Family: More than three times a week    Attends Religious Services: More than 4 times per year    Active Member of Clubs or Organizations: No    Attends Banker Meetings: Never    Marital Status: Married  Depression (PHQ2-9): Low Risk (02/21/2024)   Depression (PHQ2-9)    PHQ-2 Score: 0  Alcohol Screen: Low Risk (10/03/2023)   Alcohol Screen    Last Alcohol Screening Score (AUDIT): 0  Housing: Low Risk (01/14/2024)   Epic    Unable to Pay for Housing in the Last Year: No    Number of Times Moved in the Last Year: 0    Homeless in the Last Year: No  Utilities: Not At Risk (01/14/2024)   Epic    Threatened with loss of utilities: No  Health Literacy: Adequate Health Literacy (10/03/2023)   B1300 Health Literacy    Frequency of need for help with medical instructions: Never       Objective:   Physical Exam Vitals reviewed.  Constitutional:      General: He is not in acute distress.    Appearance: He is well-developed.  HENT:     Head: Normocephalic.     Right Ear: Tympanic membrane normal.     Left Ear: Tympanic membrane normal.  Eyes:     General:        Right eye: No discharge.        Left eye: No discharge.     Pupils: Pupils are equal, round, and reactive to light.  Neck:     Thyroid : No thyromegaly.  Cardiovascular:     Rate and Rhythm: Normal rate and regular rhythm.     Heart sounds: Normal heart sounds. No murmur heard. Pulmonary:     Effort: Pulmonary effort is normal. No respiratory distress.     Breath sounds: Normal breath sounds. No wheezing.  Abdominal:      General: Bowel sounds are normal. There is no distension.     Palpations: Abdomen is soft.     Tenderness: There is no abdominal tenderness.  Musculoskeletal:        General: No tenderness. Normal range of motion.     Cervical back: Normal range of motion and neck supple.  Skin:    General: Skin is warm and dry.     Findings: No erythema, lesion or rash.  Neurological:  Mental Status: He is alert and oriented to person, place, and time.     Cranial Nerves: No cranial nerve deficit.     Deep Tendon Reflexes: Reflexes are normal and symmetric.  Psychiatric:        Behavior: Behavior normal.        Thought Content: Thought content normal.        Judgment: Judgment normal.     BP 138/79   Pulse 82   Temp 97.6 F (36.4 C) (Temporal)   Ht 5' 6 (1.676 m)   Wt 175 lb 6.4 oz (79.6 kg)   SpO2 96%   BMI 28.31 kg/m      Assessment & Plan:  KINTA MARTIS comes in today with chief complaint of Medical Management of Chronic Issues   Diagnosis and orders addressed:  1. Type 2 diabetes mellitus without complication, without long-term current use of insulin  (HCC) - Bayer DCA Hb A1c Waived - CBC with Differential/Platelet - CMP14+EGFR - Vitamin B12 - Microalbumin / creatinine urine ratio - TSH - metFORMIN  (GLUCOPHAGE ) 1000 MG tablet; Take 1 tablet (1,000 mg total) by mouth 2 (two) times daily with a meal.  Dispense: 100 tablet; Refill: 2  2. Aortic atherosclerosis - CBC with Differential/Platelet - CMP14+EGFR  3. Simple chronic bronchitis (HCC) - CBC with Differential/Platelet - CMP14+EGFR  4. Gastroesophageal reflux disease, unspecified whether esophagitis present - CBC with Differential/Platelet - CMP14+EGFR  5. Hyperlipidemia associated with type 2 diabetes mellitus (HCC) - CBC with Differential/Platelet - Lipid panel - CMP14+EGFR  6. Hypertension associated with diabetes (HCC) - CBC with Differential/Platelet - CMP14+EGFR  7. Overweight (BMI 25.0-29.9) -  CBC with Differential/Platelet - CMP14+EGFR  8. Hepatic steatosis  - CBC with Differential/Platelet - CMP14+EGFR  9. Annual physical exam (Primary) - Ambulatory referral to Gastroenterology - Bayer DCA Hb A1c Waived - CBC with Differential/Platelet - Lipid panel - CMP14+EGFR - Vitamin B12 - Microalbumin / creatinine urine ratio - TSH - PSA, total and free - metFORMIN  (GLUCOPHAGE ) 1000 MG tablet; Take 1 tablet (1,000 mg total) by mouth 2 (two) times daily with a meal.  Dispense: 100 tablet; Refill: 2  10. Colon cancer screening - Ambulatory referral to Gastroenterology - CBC with Differential/Platelet - CMP14+EGFR   Labs pending Continue current medications  Low carb Health Maintenance reviewed Diet and exercise encouraged  Follow up plan: 4 months    Bari Learn, FNP

## 2024-02-22 ENCOUNTER — Telehealth: Payer: Self-pay

## 2024-02-22 ENCOUNTER — Other Ambulatory Visit: Payer: Self-pay | Admitting: Family

## 2024-02-22 ENCOUNTER — Ambulatory Visit: Payer: Self-pay | Admitting: Family

## 2024-02-22 ENCOUNTER — Telehealth: Payer: Self-pay | Admitting: Family

## 2024-02-22 LAB — CBC WITH DIFFERENTIAL/PLATELET
Basophils Absolute: 0.1 x10E3/uL (ref 0.0–0.2)
Basos: 1 %
EOS (ABSOLUTE): 0.3 x10E3/uL (ref 0.0–0.4)
Eos: 3 %
Hematocrit: 56.3 % — ABNORMAL HIGH (ref 37.5–51.0)
Hemoglobin: 18.6 g/dL — ABNORMAL HIGH (ref 13.0–17.7)
Immature Grans (Abs): 0 x10E3/uL (ref 0.0–0.1)
Immature Granulocytes: 0 %
Lymphocytes Absolute: 2 x10E3/uL (ref 0.7–3.1)
Lymphs: 26 %
MCH: 29.9 pg (ref 26.6–33.0)
MCHC: 33 g/dL (ref 31.5–35.7)
MCV: 91 fL (ref 79–97)
Monocytes Absolute: 0.8 x10E3/uL (ref 0.1–0.9)
Monocytes: 10 %
Neutrophils Absolute: 4.7 x10E3/uL (ref 1.4–7.0)
Neutrophils: 60 %
Platelets: 221 x10E3/uL (ref 150–450)
RBC: 6.22 x10E6/uL — ABNORMAL HIGH (ref 4.14–5.80)
RDW: 13 % (ref 11.6–15.4)
WBC: 7.8 x10E3/uL (ref 3.4–10.8)

## 2024-02-22 LAB — LIPID PANEL
Chol/HDL Ratio: 2.9 ratio (ref 0.0–5.0)
Cholesterol, Total: 100 mg/dL (ref 100–199)
HDL: 34 mg/dL — ABNORMAL LOW
LDL Chol Calc (NIH): 39 mg/dL (ref 0–99)
Triglycerides: 159 mg/dL — ABNORMAL HIGH (ref 0–149)
VLDL Cholesterol Cal: 27 mg/dL (ref 5–40)

## 2024-02-22 LAB — CMP14+EGFR
ALT: 16 IU/L (ref 0–44)
AST: 17 IU/L (ref 0–40)
Albumin: 4.5 g/dL (ref 3.9–4.9)
Alkaline Phosphatase: 77 IU/L (ref 47–123)
BUN/Creatinine Ratio: 17 (ref 10–24)
BUN: 19 mg/dL (ref 8–27)
Bilirubin Total: 0.5 mg/dL (ref 0.0–1.2)
CO2: 24 mmol/L (ref 20–29)
Calcium: 9.6 mg/dL (ref 8.6–10.2)
Chloride: 102 mmol/L (ref 96–106)
Creatinine, Ser: 1.09 mg/dL (ref 0.76–1.27)
Globulin, Total: 2.4 g/dL (ref 1.5–4.5)
Glucose: 60 mg/dL — ABNORMAL LOW (ref 70–99)
Potassium: 4.6 mmol/L (ref 3.5–5.2)
Sodium: 142 mmol/L (ref 134–144)
Total Protein: 6.9 g/dL (ref 6.0–8.5)
eGFR: 73 mL/min/1.73

## 2024-02-22 LAB — MICROALBUMIN / CREATININE URINE RATIO
Creatinine, Urine: 89.3 mg/dL
Microalb/Creat Ratio: 6 mg/g{creat} (ref 0–29)
Microalbumin, Urine: 5.7 ug/mL

## 2024-02-22 LAB — PSA, TOTAL AND FREE
PSA, Free Pct: 30 %
PSA, Free: 0.39 ng/mL
Prostate Specific Ag, Serum: 1.3 ng/mL (ref 0.0–4.0)

## 2024-02-22 LAB — TSH: TSH: 1.85 u[IU]/mL (ref 0.450–4.500)

## 2024-02-22 LAB — VITAMIN B12: Vitamin B-12: 605 pg/mL (ref 232–1245)

## 2024-02-22 MED ORDER — RYBELSUS 3 MG PO TABS: 3.0000 mg | ORAL_TABLET | Freq: Every day | ORAL | 1 refills | Status: AC

## 2024-02-22 NOTE — Telephone Encounter (Signed)
 Refer to labs

## 2024-02-22 NOTE — Telephone Encounter (Signed)
 [facility]  requiring PA. Will forward to our PA team.    Copied from CRM #[id number]. Topic: Clinical - Prescription Issue >> Feb 22, 2024  4:51 PM Ryan Mcintyre wrote: Reason for CRM: the rx that the pt got prescribed is going to cost over 600.00 and he can not get it.

## 2024-02-22 NOTE — Telephone Encounter (Signed)
 Copied from CRM #[id number]. Topic: Clinical - Lab/Test Results >> Feb 22, 2024  2:41 PM Cherylann RAMAN wrote: Reason for CRM: Patient returning call regarding lab results. Please contact patient at [phone].

## 2024-02-25 ENCOUNTER — Other Ambulatory Visit (HOSPITAL_COMMUNITY): Payer: Self-pay

## 2024-02-25 NOTE — Telephone Encounter (Signed)
 Good morning Ryan Mcintyre, Ryan Mcintyre  prescription went through his insurance and no PA is needed at this time. The cost of the medication is over $3000.00 for 3 month supply.  [facility] filled the prescription using his insurance 02/22/24 according to our test bill. He may want to see how much just 1 month supply will cost him.

## 2024-02-26 ENCOUNTER — Encounter (INDEPENDENT_AMBULATORY_CARE_PROVIDER_SITE_OTHER): Payer: Self-pay | Admitting: *Deleted

## 2024-03-04 NOTE — Telephone Encounter (Signed)
 Left detailed message making patient aware of PA Teams advise and for patient to call back and let us  know. Will also forward to PCP and Mliss for advise on options for patient.

## 2024-03-05 ENCOUNTER — Telehealth: Payer: Self-pay | Admitting: Pharmacist

## 2024-03-05 DIAGNOSIS — E119 Type 2 diabetes mellitus without complications: Secondary | ICD-10-CM

## 2024-03-05 NOTE — Telephone Encounter (Signed)
 Patient unable to afford Rybelsus  and needs medication management.  [id number] placed to pharmacy  Mliss Tarry Griffin, PharmD, BCACP, CPP Clinical Pharmacist, [facility]

## 2024-03-10 ENCOUNTER — Telehealth: Payer: Self-pay

## 2024-03-10 NOTE — Progress Notes (Signed)
 Complex Care Management Care Guide Note  03/10/2024 Name: Ryan Mcintyre MRN: [id number] DOB: [birth year]  Ryan Mcintyre is a 69 y.o. year old male who is a primary care patient of Lavell Bari LABOR, FNP and is actively engaged with the care management team. I reached out to Wilhelmina JINNY Sieve by phone today to assist with re-scheduling  with the Pharmacist.  Follow up plan: Unsuccessful telephone outreach attempt made. A HIPAA compliant phone message was left for the patient providing contact information and requesting a return call.  Jeoffrey Buffalo , RMA     [facility]  [facility], [facility], [place] Guide  Direct Dial : [phone]  Website: [email or web address]

## 2024-03-13 NOTE — Progress Notes (Unsigned)
 Complex Care Management Care Guide Note  03/13/2024 Name: Ryan Mcintyre MRN: [id number] DOB: [birth year]  Ryan Mcintyre is a 69 y.o. year old male who is a primary care patient of Lavell Bari LABOR, FNP and is actively engaged with the care management team. I reached out to Wilhelmina JINNY Sieve by phone today to assist with re-scheduling  with the Pharmacist.  Follow up plan: Unsuccessful telephone outreach attempt made. A HIPAA compliant phone message was left for the patient providing contact information and requesting a return call.  Jeoffrey Buffalo , RMA     [facility]  [facility], [facility] Guide  Direct Dial : [phone]  Website: [email or web address]

## 2024-03-14 ENCOUNTER — Other Ambulatory Visit: Payer: Self-pay | Admitting: *Deleted

## 2024-03-14 DIAGNOSIS — Z122 Encounter for screening for malignant neoplasm of respiratory organs: Secondary | ICD-10-CM

## 2024-03-14 DIAGNOSIS — Z87891 Personal history of nicotine dependence: Secondary | ICD-10-CM

## 2024-03-14 NOTE — Patient Instructions (Signed)
 Visit Information  Thank you for taking time to visit with me today. Please don't hesitate to contact me if I can be of assistance to you before our next scheduled appointment.  Your next care management appointment is by telephone on 04-14-2024 at 11:00 am  Telephone follow-up in 1 month  Please call the care guide team at [phone] if you need to cancel, schedule, or reschedule an appointment.   Please call the Suicide and Crisis Lifeline: 988 call the [place]  National Suicide Prevention Lifeline: 217-409-8827 or TTY: 782-831-9746 TTY 670-409-1352) to talk to a trained counselor call 1-800-273-TALK (toll free, 24 hour hotline) if you are experiencing a Mental Health or Behavioral Health Crisis or need someone to talk to.  Rosina Forte, BSN RN [facility], [facility] RN Care Manager Direct Dial : [phone]  Fax: [phone]

## 2024-03-14 NOTE — Patient Outreach (Signed)
 Complex Care Management   Visit Note  03/14/2024  Name:  Ryan Mcintyre MRN: [id number] DOB: [birth year]  Situation: Referral received for Complex Care Management related to COPD and Diabetes with Complications I obtained verbal consent from Patient.  Visit completed with Patient  on the phone  Background:   Past Medical History:  Diagnosis Date   COPD (chronic obstructive pulmonary disease) (HCC) 02/21/2017   Diabetes mellitus without complication (HCC)    GERD (gastroesophageal reflux disease)    Hyperlipidemia     Assessment: Patient Reported Symptoms:  Cognitive Cognitive Status: No symptoms reported Cognitive/Intellectual Conditions Management [RPT]: None reported or documented in medical history or problem list   Health Maintenance Behaviors: Annual physical exam Healing Pattern: Average Health Facilitated by: Rest  Neurological Neurological Review of Symptoms: No symptoms reported Neurological Management Strategies: Routine screening Neurological Self-Management Outcome: 5 (very good)  HEENT HEENT Symptoms Reported: No symptoms reported HEENT Management Strategies: Routine screening HEENT Self-Management Outcome: 5 (very good)    Cardiovascular Cardiovascular Symptoms Reported: No symptoms reported Does patient have uncontrolled Hypertension?: Yes Is patient checking Blood Pressure at home?: No Cardiovascular Management Strategies: Routine screening Cardiovascular Self-Management Outcome: 3 (uncertain)  Respiratory Respiratory Symptoms Reported: No symptoms reported Respiratory Management Strategies: Routine screening Respiratory Self-Management Outcome: 5 (very good)  Endocrine Endocrine Symptoms Reported: No symptoms reported Is patient diabetic?: Yes Is patient checking blood sugars at home?: Yes List most recent blood sugar readings, include date and time of day: 03-14-2024 at 0800 119 Endocrine Self-Management Outcome: 4 (good)  Gastrointestinal Gastrointestinal  Symptoms Reported: No symptoms reported Gastrointestinal Self-Management Outcome: 4 (good) Nutrition Risk Screen (CP): No indicators present  Genitourinary Genitourinary Symptoms Reported: No symptoms reported Genitourinary Self-Management Outcome: 4 (good)  Integumentary Integumentary Symptoms Reported: No symptoms reported Skin Management Strategies: Routine screening Skin Self-Management Outcome: 5 (very good)  Musculoskeletal Musculoskelatal Symptoms Reviewed: No symptoms reported Musculoskeletal Management Strategies: Routine screening Musculoskeletal Self-Management Outcome: 5 (very good) Falls in the past year?: No Number of falls in past year: 1 or less Was there an injury with Fall?: No Fall Risk Category Calculator: 0 Patient Fall Risk Level: Low Fall Risk Patient at Risk for Falls Due to: No Fall Risks Fall risk Follow up: Falls evaluation completed  Psychosocial Psychosocial Symptoms Reported: No symptoms reported Behavioral Management Strategies: Coping strategies Behavioral Health Self-Management Outcome: 5 (very good) Major Change/Loss/Stressor/Fears (CP): Denies      03/14/2024    PHQ2-9 Depression Screening   Little interest or pleasure in doing things Not at all  Feeling down, depressed, or hopeless Not at all  PHQ-2 - Total Score 0  Trouble falling or staying asleep, or sleeping too much    Feeling tired or having little energy    Poor appetite or overeating     Feeling bad about yourself - or that you are a failure or have let yourself or your family down    Trouble concentrating on things, such as reading the newspaper or watching television    Moving or speaking so slowly that other people could have noticed.  Or the opposite - being so fidgety or restless that you have been moving around a lot more than usual    Thoughts that you would be better off dead, or hurting yourself in some way    PHQ2-9 Total Score    If you checked off any problems, how difficult  have these problems made it for you to do your work, take care of things at home,  or get along with other people    Depression Interventions/Treatment      There were no vitals filed for this visit. Pain Scale: 0-10 Pain Score: 0-No pain  Medications Reviewed Today     Reviewed by Bertrum Rosina HERO, RN (Registered Nurse) on 03/14/24 at 1104  Med List Status: <None>   Medication Order Taking? Sig Documenting Provider Last Dose Status Informant  albuterol  (VENTOLIN  HFA) 108 (90 Base) MCG/ACT inhaler [id number] Yes Inhale 2 puffs into the lungs every 4 (four) hours as needed for wheezing or shortness of breath. Cleotilde Rogue, MD  Active   aspirin  [facility] ASPIRIN  ADULT LOW DOSE) 81 MG tablet [id number] Yes TAKE 1 TABLET BY MOUTH ONCE DAILY.  SWALLOW WHOLE Hawks, Bari LABOR, FNP  Active Self, Spouse/Significant Other, Pharmacy Records  atorvastatin  (LIPITOR) 40 MG tablet [id number] Yes Take 1 tablet (40 mg total) by mouth daily. Lavell Bari LABOR, FNP  Active Self, Spouse/Significant Other, Pharmacy Records  blood glucose meter kit and supplies [id number] Yes Dispense based on patient and insurance preference. Use up to four times daily as directed. (FOR ICD-10 E10.9, E11.9). Lavell Bari LABOR, FNP  Active Self, Spouse/Significant Other, Pharmacy Records  budesonide -formoterol  (SYMBICORT ) 80-4.5 MCG/ACT inhaler [id number] Yes Inhale 2 puffs into the lungs 2 (two) times daily. Joesph Annabella HERO, FNP  Active   Continuous Glucose Sensor (FREESTYLE LIBRE 3 PLUS SENSOR) [place] [id number] Yes Change sensor every 15 days.USE AS DIRECTED CHANGE EVERY 15 DAYS TO MONITOR BLOOD GLUCOSE CONTINUOUSLY Hawks, Christy A, FNP  Active   dapagliflozin  propanediol (FARXIGA ) 10 MG TABS tablet [id number] Yes Take 1 tablet (10 mg total) by mouth daily before breakfast. Lavell Bari LABOR, FNP  Active Self, Spouse/Significant Other, Pharmacy Records  glipiZIDE (GLUCOTROL) 5 MG tablet [id number] Yes Take 5 mg by mouth 2 (two) times daily  before a meal.  Patient taking differently: Take 5 mg by mouth daily before breakfast.   [provider]  Active   glucose blood test strip [id number] Yes Test daily. Lavell Bari LABOR, FNP  Active Self, Spouse/Significant Other, Pharmacy Records  lisinopril  (ZESTRIL ) 20 MG tablet [id number]  Take 1 tablet by mouth once daily  Patient not taking: Reported on 03/14/2024   Lavell Bari LABOR, FNP  Active   losartan (COZAAR) 100 MG tablet [id number] Yes Take 100 mg by mouth daily. [provider]  Active   metFORMIN  (GLUCOPHAGE ) 1000 MG tablet [id number] Yes Take 1 tablet (1,000 mg total) by mouth 2 (two) times daily with a meal. Hawks, Christy A, FNP  Active   omeprazole  (PRILOSEC) 40 MG capsule [id number] Yes Take 1 capsule (40 mg total) by mouth daily. Lavell Bari LABOR, FNP  Active Self, Spouse/Significant Other, Pharmacy Records  Semaglutide  (RYBELSUS ) 3 MG TABS [id number] Yes Take 1 tablet (3 mg total) by mouth daily. Lavell Bari A, FNP  Active   sildenafil  (VIAGRA ) 100 MG tablet [id number]  Take 0.5-1 tablets (50-100 mg total) by mouth daily as needed for erectile dysfunction.  Patient not taking: Reported on 03/14/2024   Lavell Bari LABOR, FNP  Consider Medication Status and Discontinue Self, Spouse/Significant Other, Pharmacy Records            Recommendation:   Continue Current Plan of Care Follow prescribed medication and dietary recommendations  Follow Up Plan:   Telephone follow-up in 1 month  Rosina Bertrum, BSN RN [facility], [facility] RN Care Manager Direct Dial : [phone]  Fax: [phone]

## 2024-03-16 ENCOUNTER — Other Ambulatory Visit: Payer: Self-pay | Admitting: Family

## 2024-03-16 DIAGNOSIS — E1159 Type 2 diabetes mellitus with other circulatory complications: Secondary | ICD-10-CM

## 2024-03-16 DIAGNOSIS — I152 Hypertension secondary to endocrine disorders: Secondary | ICD-10-CM

## 2024-03-26 ENCOUNTER — Other Ambulatory Visit

## 2024-04-03 ENCOUNTER — Encounter: Payer: Self-pay | Admitting: Acute Care

## 2024-04-14 ENCOUNTER — Telehealth: Admitting: *Deleted

## 2024-04-22 ENCOUNTER — Other Ambulatory Visit

## 2024-04-24 ENCOUNTER — Ambulatory Visit: Admitting: Family

## 2024-10-03 ENCOUNTER — Ambulatory Visit: Payer: Self-pay
